# Patient Record
Sex: Male | Born: 1935 | Race: White | Hispanic: No | Marital: Married | Smoking: Former smoker
Health system: Southern US, Community
[De-identification: ages and names within clinical notes are randomized; demographics above are authoritative.]

## PROBLEM LIST (undated history)

## (undated) DIAGNOSIS — C44300 Unspecified malignant neoplasm of skin of unspecified part of face: Secondary | ICD-10-CM

## (undated) DIAGNOSIS — M1990 Unspecified osteoarthritis, unspecified site: Secondary | ICD-10-CM

## (undated) DIAGNOSIS — N289 Disorder of kidney and ureter, unspecified: Secondary | ICD-10-CM

## (undated) DIAGNOSIS — G629 Polyneuropathy, unspecified: Secondary | ICD-10-CM

## (undated) DIAGNOSIS — F32A Depression, unspecified: Secondary | ICD-10-CM

## (undated) DIAGNOSIS — K219 Gastro-esophageal reflux disease without esophagitis: Secondary | ICD-10-CM

## (undated) DIAGNOSIS — E7800 Pure hypercholesterolemia, unspecified: Secondary | ICD-10-CM

## (undated) DIAGNOSIS — F419 Anxiety disorder, unspecified: Secondary | ICD-10-CM

## (undated) DIAGNOSIS — I10 Essential (primary) hypertension: Secondary | ICD-10-CM

## (undated) DIAGNOSIS — F329 Major depressive disorder, single episode, unspecified: Secondary | ICD-10-CM

## (undated) DIAGNOSIS — C61 Malignant neoplasm of prostate: Secondary | ICD-10-CM

## (undated) DIAGNOSIS — F0390 Unspecified dementia without behavioral disturbance: Secondary | ICD-10-CM

## (undated) HISTORY — PX: TONSILLECTOMY: SUR1361

---

## 1984-01-11 HISTORY — PX: CARDIAC CATHETERIZATION: SHX172

## 1999-01-11 HISTORY — PX: CATARACT EXTRACTION, BILATERAL: SHX1313

## 2000-07-27 ENCOUNTER — Encounter: Admission: RE | Admit: 2000-07-27 | Discharge: 2000-10-25 | Payer: Self-pay | Admitting: Radiation Oncology

## 2000-12-09 ENCOUNTER — Encounter: Payer: Self-pay | Admitting: Urology

## 2000-12-09 ENCOUNTER — Ambulatory Visit: Admission: RE | Admit: 2000-12-09 | Discharge: 2001-03-09 | Payer: Self-pay | Admitting: Radiation Oncology

## 2001-04-11 HISTORY — PX: INSERTION PROSTATE RADIATION SEED: SUR718

## 2001-04-12 ENCOUNTER — Ambulatory Visit: Admission: RE | Admit: 2001-04-12 | Discharge: 2001-07-11 | Payer: Self-pay | Admitting: Radiation Oncology

## 2001-04-23 ENCOUNTER — Ambulatory Visit (HOSPITAL_BASED_OUTPATIENT_CLINIC_OR_DEPARTMENT_OTHER): Admission: RE | Admit: 2001-04-23 | Discharge: 2001-04-23 | Payer: Self-pay | Admitting: Urology

## 2003-07-07 ENCOUNTER — Encounter: Payer: Self-pay | Admitting: Pulmonary Disease

## 2004-09-19 ENCOUNTER — Ambulatory Visit: Payer: Self-pay | Admitting: Pulmonary Disease

## 2004-09-19 ENCOUNTER — Encounter: Payer: Self-pay | Admitting: Pulmonary Disease

## 2004-10-18 ENCOUNTER — Ambulatory Visit: Payer: Self-pay | Admitting: Pulmonary Disease

## 2005-01-08 ENCOUNTER — Ambulatory Visit: Payer: Self-pay | Admitting: Pulmonary Disease

## 2005-03-10 ENCOUNTER — Ambulatory Visit: Payer: Self-pay | Admitting: Pulmonary Disease

## 2005-03-31 ENCOUNTER — Ambulatory Visit: Payer: Self-pay | Admitting: Pulmonary Disease

## 2005-06-11 ENCOUNTER — Ambulatory Visit: Payer: Self-pay | Admitting: Pulmonary Disease

## 2005-09-10 ENCOUNTER — Ambulatory Visit: Payer: Self-pay | Admitting: Pulmonary Disease

## 2007-06-28 ENCOUNTER — Ambulatory Visit: Payer: Self-pay | Admitting: Internal Medicine

## 2007-06-28 DIAGNOSIS — J209 Acute bronchitis, unspecified: Secondary | ICD-10-CM

## 2007-06-28 DIAGNOSIS — K219 Gastro-esophageal reflux disease without esophagitis: Secondary | ICD-10-CM | POA: Insufficient documentation

## 2007-06-28 DIAGNOSIS — R05 Cough: Secondary | ICD-10-CM

## 2007-06-28 DIAGNOSIS — J329 Chronic sinusitis, unspecified: Secondary | ICD-10-CM | POA: Insufficient documentation

## 2007-06-28 DIAGNOSIS — R059 Cough, unspecified: Secondary | ICD-10-CM | POA: Insufficient documentation

## 2007-06-28 DIAGNOSIS — R0602 Shortness of breath: Secondary | ICD-10-CM

## 2008-02-28 ENCOUNTER — Ambulatory Visit: Payer: Self-pay | Admitting: Pulmonary Disease

## 2008-03-16 ENCOUNTER — Telehealth: Payer: Self-pay | Admitting: Pulmonary Disease

## 2008-03-21 ENCOUNTER — Ambulatory Visit: Payer: Self-pay | Admitting: Internal Medicine

## 2010-09-27 NOTE — Op Note (Signed)
[facility]  Patient:    Miguel Compton, YOUNGBERG Visit Number: [id number] MRN: [id number]          Service Type: NES Location: [facility] Attending Physician:  Trisha Mangle Dictated by:   Veverly Fells Vernie Ammons, M.D. Proc. Date: 04/23/01 Admit Date:  04/23/2001   CC:         Wynn Banker, M.D.  Renae Fickle, M.D.  Ophelia Charter, M.D.   Operative Report  PREOPERATIVE DIAGNOSIS:  Adenocarcinoma of the prostate.  POSTOPERATIVE DIAGNOSIS:  Adenocarcinoma of the prostate.  PROCEDURE:  I-125 radioactive seed implantation.  SURGEON:  Mark C. Vernie Ammons, M.D.  RADIATION ONCOLOGIST:  Wynn Banker, M.D.  BLOOD LOSS:  10 cc.  DRAINS:  33 French Foley catheter.  NUMBER OF SEEDS IMPLANTED:  97.  NUMBER OF NEEDLES USED:  25.  SPECIMENS:  None.  COMPLICATIONS:  None.  INDICATIONS:  The patient is a 77 year old white male with biopsy-proven adenocarcinoma of his prostate.  He had a Gleason score of 6.6 and biopsy revealed Gleason 6 (3+3) from the left lobe of the prostate.  The patient presented initially to me wanting to consider seed implantation; however, the size of his prostate appeared to preclude adequate coverage due to the bony pelvis interfering, and he was therefore placed on Lupron and an antiandrogen. After allowing the prostate volume time to decrease, the measurement of the prostate fell from 95 cc to 56 cc.  The patient is now brought to the operating room for radioactive seed implantation.  He understands the risks, complications, alternatives, and limitations.  DESCRIPTION OF OPERATION:  After informed consent, the patient brought to the major OR, placed on the table, and administered general anesthesia, then moved to a modified lithotomy position with the perineum perpendicular to the floor. A 16 French Foley catheter was inserted with dilute contrast filling the balloon, and the ultrasound probe with fixation device was placed  within the rectum and affixed to the table.  A rectal tube had been previously inserted within the rectum, and the ultrasound probe was positioned to that identical to planning.  Stabilizing needles were then inserted, and direct fluoroscopic and ultrasound visualization were used in order to implant the seeds using the grid.  No difficulty with bony pelvis interference was encountered.  After the seeds were placed, the transrectal ultrasound was removed, and flexible cystoscopy was performed.  I noted a single seed protruding from the anterior prostate into the bladder lumen upon retroflexion of the scope and therefore grasped this and removed the strand completely.  A second strand was noted to traverse the urethra just slightly.  My feeling was that this would likely serve as a significant source of irritation, and this was also grasped with alligator graspers and removed.  No further seeds, stones, or tumors were noted within the bladder which appeared to be otherwise normal.  The cystoscope was removed, and a Foley catheter was placed using 18 French Foley catheter.  The patient tolerated the procedure well.  There were no intraoperative complications.  He will be given a prescription for Vicodin ES #30, Flomax 0.4 mg 1 q.h.s. and Cipro 500 mg b.i.d. for 5 days.  He was given written instructions on medications and posttreatment side effects, etc.  His catheter will remain indwelling until Monday morning, at which time he will take the catheter out at home.  He will follow up with me in three weeks. Dictated by:   Veverly Fells Vernie Ammons, M.D. Attending Physician:  Vernie Ammons,  Dennison Mascot DD:  04/23/01 TD:  04/23/01 Job: [id number]

## 2014-03-31 ENCOUNTER — Encounter (HOSPITAL_COMMUNITY): Payer: Self-pay | Admitting: Family Medicine

## 2014-03-31 ENCOUNTER — Inpatient Hospital Stay (HOSPITAL_COMMUNITY)
Admission: EM | Admit: 2014-03-31 | Discharge: 2014-04-02 | DRG: 683 | Disposition: A | Discharge status: Home / Self Care | Payer: Medicare Other | Attending: Internal Medicine | Admitting: Internal Medicine

## 2014-03-31 DIAGNOSIS — E870 Hyperosmolality and hypernatremia: Secondary | ICD-10-CM | POA: Diagnosis present

## 2014-03-31 DIAGNOSIS — N289 Disorder of kidney and ureter, unspecified: Secondary | ICD-10-CM

## 2014-03-31 DIAGNOSIS — F329 Major depressive disorder, single episode, unspecified: Secondary | ICD-10-CM | POA: Diagnosis present

## 2014-03-31 DIAGNOSIS — Z7982 Long term (current) use of aspirin: Secondary | ICD-10-CM

## 2014-03-31 DIAGNOSIS — I10 Essential (primary) hypertension: Secondary | ICD-10-CM | POA: Diagnosis present

## 2014-03-31 DIAGNOSIS — Z79899 Other long term (current) drug therapy: Secondary | ICD-10-CM | POA: Diagnosis not present

## 2014-03-31 DIAGNOSIS — R3581 Nocturnal polyuria: Secondary | ICD-10-CM | POA: Diagnosis present

## 2014-03-31 DIAGNOSIS — J449 Chronic obstructive pulmonary disease, unspecified: Secondary | ICD-10-CM | POA: Diagnosis present

## 2014-03-31 DIAGNOSIS — N170 Acute kidney failure with tubular necrosis: Principal | ICD-10-CM | POA: Diagnosis present

## 2014-03-31 DIAGNOSIS — F419 Anxiety disorder, unspecified: Secondary | ICD-10-CM | POA: Diagnosis present

## 2014-03-31 DIAGNOSIS — D649 Anemia, unspecified: Secondary | ICD-10-CM | POA: Diagnosis present

## 2014-03-31 DIAGNOSIS — R109 Unspecified abdominal pain: Secondary | ICD-10-CM

## 2014-03-31 DIAGNOSIS — Z8673 Personal history of transient ischemic attack (TIA), and cerebral infarction without residual deficits: Secondary | ICD-10-CM

## 2014-03-31 DIAGNOSIS — F0390 Unspecified dementia without behavioral disturbance: Secondary | ICD-10-CM | POA: Diagnosis present

## 2014-03-31 DIAGNOSIS — R32 Unspecified urinary incontinence: Secondary | ICD-10-CM

## 2014-03-31 DIAGNOSIS — Z87891 Personal history of nicotine dependence: Secondary | ICD-10-CM | POA: Diagnosis not present

## 2014-03-31 DIAGNOSIS — E785 Hyperlipidemia, unspecified: Secondary | ICD-10-CM | POA: Diagnosis present

## 2014-03-31 DIAGNOSIS — N179 Acute kidney failure, unspecified: Secondary | ICD-10-CM

## 2014-03-31 DIAGNOSIS — R4189 Other symptoms and signs involving cognitive functions and awareness: Secondary | ICD-10-CM

## 2014-03-31 DIAGNOSIS — R351 Nocturia: Secondary | ICD-10-CM

## 2014-03-31 DIAGNOSIS — K219 Gastro-esophageal reflux disease without esophagitis: Secondary | ICD-10-CM

## 2014-03-31 HISTORY — DX: Major depressive disorder, single episode, unspecified: F32.9

## 2014-03-31 HISTORY — DX: Pure hypercholesterolemia, unspecified: E78.00

## 2014-03-31 HISTORY — DX: Depression, unspecified: F32.A

## 2014-03-31 HISTORY — DX: Unspecified dementia, unspecified severity, without behavioral disturbance, psychotic disturbance, mood disturbance, and anxiety: F03.90

## 2014-03-31 HISTORY — DX: Anxiety disorder, unspecified: F41.9

## 2014-03-31 LAB — BASIC METABOLIC PANEL
Anion gap: 14 (ref 5–15)
BUN: 29 mg/dL — ABNORMAL HIGH (ref 6–23)
CO2: 23 mEq/L (ref 19–32)
Calcium: 8.9 mg/dL (ref 8.4–10.5)
Chloride: 100 mEq/L (ref 96–112)
Creatinine, Ser: 2.98 mg/dL — ABNORMAL HIGH (ref 0.50–1.35)
GFR calc Af Amer: 20 mL/min — ABNORMAL LOW (ref >90)
GFR calc non Af Amer: 17 mL/min — ABNORMAL LOW (ref >90)
Glucose, Bld: 96 mg/dL (ref 70–99)
Potassium: 4.7 mEq/L (ref 3.7–5.3)
Sodium: 137 mEq/L (ref 137–147)

## 2014-03-31 LAB — URINALYSIS, ROUTINE W REFLEX MICROSCOPIC
Bilirubin Urine: NEGATIVE
Glucose, UA: NEGATIVE mg/dL
Hgb urine dipstick: NEGATIVE
Ketones, ur: NEGATIVE mg/dL
Leukocytes, UA: NEGATIVE
Nitrite: NEGATIVE
Protein, ur: NEGATIVE mg/dL
Specific Gravity, Urine: 1.006 (ref 1.005–1.030)
Urobilinogen, UA: 0.2 mg/dL (ref 0.0–1.0)
pH: 7 (ref 5.0–8.0)

## 2014-03-31 LAB — URINALYSIS W MICROSCOPIC (NOT AT ARMC)
BILIRUBIN URINE: NEGATIVE
Glucose, UA: NEGATIVE mg/dL
HGB URINE DIPSTICK: NEGATIVE
Ketones, ur: NEGATIVE mg/dL
Leukocytes, UA: NEGATIVE
NITRITE: NEGATIVE
PROTEIN: NEGATIVE mg/dL
SPECIFIC GRAVITY, URINE: 1.007 (ref 1.005–1.030)
UROBILINOGEN UA: 0.2 mg/dL (ref 0.0–1.0)
pH: 7 (ref 5.0–8.0)

## 2014-03-31 LAB — CBC
HCT: 32 % — ABNORMAL LOW (ref 39.0–52.0)
Hemoglobin: 10.6 g/dL — ABNORMAL LOW (ref 13.0–17.0)
MCH: 30.2 pg (ref 26.0–34.0)
MCHC: 33.1 g/dL (ref 30.0–36.0)
MCV: 91.2 fL (ref 78.0–100.0)
Platelets: 262 10*3/uL (ref 150–400)
RBC: 3.51 MIL/uL — ABNORMAL LOW (ref 4.22–5.81)
RDW: 13.3 % (ref 11.5–15.5)
WBC: 8.1 10*3/uL (ref 4.0–10.5)

## 2014-03-31 MED ORDER — PANTOPRAZOLE SODIUM 40 MG PO TBEC
40.0000 mg | DELAYED_RELEASE_TABLET | Freq: Every day | ORAL | Status: DC
  Administered 2014-04-01 – 2014-04-02 (×2): 40 mg via ORAL
  Filled 2014-03-31 (×2): qty 1

## 2014-03-31 MED ORDER — ASPIRIN EC 81 MG PO TBEC
81.0000 mg | DELAYED_RELEASE_TABLET | Freq: Every day | ORAL | Status: DC
  Administered 2014-04-01 – 2014-04-02 (×2): 81 mg via ORAL
  Filled 2014-03-31 (×2): qty 1

## 2014-03-31 MED ORDER — ACETAMINOPHEN 650 MG RE SUPP: 650.0000 mg | Freq: Four times a day (QID) | RECTAL | Status: DC | PRN

## 2014-03-31 MED ORDER — PRAVASTATIN SODIUM 80 MG PO TABS
80.0000 mg | ORAL_TABLET | Freq: Every day | ORAL | Status: DC
  Administered 2014-04-01 – 2014-04-02 (×2): 80 mg via ORAL
  Filled 2014-03-31 (×2): qty 1

## 2014-03-31 MED ORDER — ACETAMINOPHEN 325 MG PO TABS: 650.0000 mg | ORAL_TABLET | Freq: Four times a day (QID) | ORAL | Status: DC | PRN

## 2014-03-31 MED ORDER — ALBUTEROL SULFATE HFA 108 (90 BASE) MCG/ACT IN AERS: 1.0000 | INHALATION_SPRAY | RESPIRATORY_TRACT | Status: DC | PRN

## 2014-03-31 MED ORDER — DONEPEZIL HCL 5 MG PO TABS
5.0000 mg | ORAL_TABLET | Freq: Every day | ORAL | Status: DC
  Administered 2014-04-01 – 2014-04-02 (×2): 5 mg via ORAL
  Filled 2014-03-31 (×2): qty 1

## 2014-03-31 MED ORDER — HEPARIN SODIUM (PORCINE) 5000 UNIT/ML IJ SOLN
5000.0000 [IU] | Freq: Three times a day (TID) | INTRAMUSCULAR | Status: DC
  Administered 2014-03-31 – 2014-04-02 (×5): 5000 [IU] via SUBCUTANEOUS
  Filled 2014-03-31 (×6): qty 1

## 2014-03-31 MED ORDER — ALBUTEROL SULFATE (2.5 MG/3ML) 0.083% IN NEBU: 3.0000 mL | INHALATION_SOLUTION | RESPIRATORY_TRACT | Status: DC | PRN

## 2014-03-31 MED ORDER — ONDANSETRON HCL 4 MG PO TABS: 4.0000 mg | ORAL_TABLET | Freq: Four times a day (QID) | ORAL | Status: DC | PRN

## 2014-03-31 MED ORDER — SODIUM CHLORIDE 0.9 % IV SOLN
INTRAVENOUS | Status: AC
  Administered 2014-04-01: via INTRAVENOUS

## 2014-03-31 MED ORDER — FLUTICASONE PROPIONATE HFA 44 MCG/ACT IN AERO
1.0000 | INHALATION_SPRAY | Freq: Two times a day (BID) | RESPIRATORY_TRACT | Status: DC
  Filled 2014-03-31: qty 10.6

## 2014-03-31 MED ORDER — MIRTAZAPINE 15 MG PO TABS
15.0000 mg | ORAL_TABLET | Freq: Every day | ORAL | Status: DC
  Administered 2014-03-31 – 2014-04-01 (×2): 15 mg via ORAL
  Filled 2014-03-31 (×3): qty 1

## 2014-03-31 MED ORDER — ONDANSETRON HCL 4 MG/2ML IJ SOLN: 4.0000 mg | Freq: Four times a day (QID) | INTRAMUSCULAR | Status: DC | PRN

## 2014-03-31 MED ORDER — ALPRAZOLAM 0.25 MG PO TABS: 0.2500 mg | ORAL_TABLET | Freq: Every day | ORAL | Status: DC | PRN

## 2014-03-31 MED ORDER — DARIFENACIN HYDROBROMIDE ER 7.5 MG PO TB24
7.5000 mg | ORAL_TABLET | Freq: Every day | ORAL | Status: DC
  Administered 2014-04-01 – 2014-04-02 (×2): 7.5 mg via ORAL
  Filled 2014-03-31 (×2): qty 1

## 2014-03-31 NOTE — ED Notes (Signed)
Dr patel paged to notify pt is in ED

## 2014-03-31 NOTE — ED Notes (Signed)
Transporting patient to new room assignment. 

## 2014-03-31 NOTE — ED Notes (Signed)
Per pt sent here by his doctor for elevated creatinine and decreased GFR. sts some lower right back pain.

## 2014-03-31 NOTE — ED Provider Notes (Addendum)
CSN: [id number]     Arrival date & time 03/31/14  1706 History   First MD Initiated Contact with Patient 03/31/14 1800     Chief Complaint  Patient presents with  . Acute Renal Failure      HPI  Patient presents for evaluation of acute renal insufficiency. He is 80 years old has no prior history of kidney disease. Has no prior history of renal insufficiency. He is not hypertensive or diabetic. He has been seen with his physician Dr. Micheal Likens or renal intensive insufficiency starting in August. Over the last 3 days he's had deterioration of his GFR/Cr. Creatinine is gone from 2.1-2.9. He has polyuria. He reports some nondescript right lower back pain.  I discussed the case upon the patient's arrival with Dr. Posey Pronto of nephrology. Dr. Micheal Likens had spoken with Dr. Posey Pronto who recommended the patient come to [facility] for dementia and further evaluation after an outpatient renal ultrasound was normal.  Not retaining urine. Not on any new or offending medications that would cause renal insufficiency.  Past Medical History  Diagnosis Date  . COPD (chronic obstructive pulmonary disease)   . High cholesterol   . Dementia   . Depression   . Anxiety   . Stroke   . Reflux esophagitis    History reviewed. No pertinent past surgical history. History reviewed. No pertinent family history. History  Substance Use Topics  . Smoking status: Former Research scientist (life sciences)  . Smokeless tobacco: Not on file  . Alcohol Use: No    Review of Systems  Constitutional: Negative for fever, chills, diaphoresis, appetite change and fatigue.  HENT: Negative for mouth sores, sore throat and trouble swallowing.   Eyes: Negative for visual disturbance.  Respiratory: Negative for cough, chest tightness, shortness of breath and wheezing.   Cardiovascular: Negative for chest pain.  Gastrointestinal: Negative for nausea, vomiting, abdominal pain, diarrhea and abdominal distention.  Endocrine: Negative for polydipsia, polyphagia and polyuria.   Genitourinary: Positive for urgency and frequency. Negative for dysuria, hematuria and difficulty urinating.       Nocturia  Musculoskeletal: Negative for gait problem.  Skin: Negative for color change, pallor and rash.  Neurological: Negative for dizziness, syncope, light-headedness and headaches.  Hematological: Does not bruise/bleed easily.  Psychiatric/Behavioral: Negative for behavioral problems and confusion.      Allergies  Review of patient's allergies indicates no known allergies.  Home Medications   Prior to Admission medications   Medication Sig Start Date End Date Taking? Authorizing Provider  donepezil (ARICEPT) 5 MG tablet Take 5 mg by mouth daily. 03/11/14  Yes Historical Provider, MD  omeprazole (PRILOSEC) 20 MG capsule Take 20 mg by mouth daily. 03/11/14  Yes Historical Provider, MD  pravastatin (PRAVACHOL) 80 MG tablet Take 80 mg by mouth daily. 03/11/14  Yes Historical Provider, MD  VESICARE 5 MG tablet Take 5 mg by mouth daily. 01/10/14  Yes Historical Provider, MD   BP 165/59 mmHg  Pulse 81  Temp(Src) 98.3 F (36.8 C)  Resp 18  SpO2 100% Physical Exam  Constitutional: He is oriented to person, place, and time. He appears well-developed and well-nourished. No distress.  HENT:  Head: Normocephalic.  Eyes: Conjunctivae are normal. Pupils are equal, round, and reactive to light. No scleral icterus.  Neck: Normal range of motion. Neck supple. No thyromegaly present.  Cardiovascular: Normal rate and regular rhythm.  Exam reveals no gallop and no friction rub.   No murmur heard. Pulmonary/Chest: Effort normal and breath sounds normal. No respiratory distress. He  has no wheezes. He has no rales.  Abdominal: Soft. Bowel sounds are normal. He exhibits no distension. There is no tenderness. There is no rebound.  Nontender over the suprapubic abdomen. Nontender to percuss over the flank. No rash or lesions or vesicles across the flank or abdomen.  Musculoskeletal:  Normal range of motion.  Neurological: He is alert and oriented to person, place, and time.  Skin: Skin is warm and dry. No rash noted.  Psychiatric: He has a normal mood and affect. His behavior is normal.    ED Course  Procedures (including critical care time) Labs Review Labs Reviewed  CBC - Abnormal; Notable for the following:    RBC 3.51 (*)    Hemoglobin 10.6 (*)    HCT 32.0 (*)    All other components within normal limits  BASIC METABOLIC PANEL - Abnormal; Notable for the following:    BUN 29 (*)    Creatinine, Ser 2.98 (*)    GFR calc non Af Amer 17 (*)    GFR calc Af Amer 20 (*)    All other components within normal limits  URINALYSIS, ROUTINE W REFLEX MICROSCOPIC    Imaging Review No results found.   EKG Interpretation None      MDM   Final diagnoses:  Renal insufficiency    Formal ultrasound report from [facility] radiology states "mild bilateral renal cortical thickening without hydronephrosis or renal mass". Date 03/31/2014  Cr 2.94, BUN 26. Ultrasound at bedside shows no urinary retention or hydronephrosis. Will place a call to hospitalist regarding admission. I have already consulted Dr. Yevette Edwards, MD 03/31/14 2050  Tanna Furry, MD 03/31/14 2100

## 2014-03-31 NOTE — H&P (Signed)
[facility] History and Physical  Patient: Miguel Compton  WPY:[id number]  DOB: 1921-07-13  DOS: the patient was seen and examined on 03/31/2014 PCP: Pcp Not In System  Chief Complaint: Abnormal lab  HPI: Miguel Compton is a 80 y.o. male with Past medical history of COPD, dementia, depression, CVA, overflow incontinence. The patient is presenting with complaints of an abnormal lab. He mentions that since last 2 months he has been having generalized malaise, leg swelling, and poor appetite with weight loss as throughout the year he has been busy taking care of his wife who has been hospitalized multiple times. He has noticed he has some right-sided flank pain ongoing since last one week. He also mentions that he has occasional episodes of constipation followed by episodes of bowel movements which are consistent only mucousy output for last a year or so. He denies any rash or taking any Aleve or ibuprofen. He denies any exposure to severe chemicals. 2 months ago on his regular follow-up with his PCP he was found to have increasing serum creatinine and was recommended for a follow-up blood work. At the time of his follow-up blood work he also complained of the flank pain and therefore he was recommended to obtain a renal ultrasound. The Renal ultrasound showed bilateral Kidney right 12.1, and left 13.4 with bilateral Diffuse cortical thinning, prevoid urine volume 25.5 post void 3.8 And No hydranephrosis His serum creatinine was 1.16 8/13, 2.13 11/16 and, 2.94 11/19, BUN ranging between 8-10 and no other abnormality He denies any hematuria. He did have some overflow incontinence for which she was placed on the medication the name of which he does not know after which she started having difficulty urination and that his medication was changed and at this point he does not have any issues with urination.  The patient is coming from home. And at his baseline independent for most of his  ADL.  Review of Systems: as mentioned in the history of present illness.  A Comprehensive review of the other systems is negative.  Past Medical History  Diagnosis Date  . COPD (chronic obstructive pulmonary disease)   . High cholesterol   . Dementia   . Depression   . Anxiety   . Stroke   . Reflux esophagitis    History reviewed. No pertinent past surgical history. Social History:  reports that he has quit smoking. He does not have any smokeless tobacco history on file. He reports that he does not drink alcohol. His drug history is not on file.  No Known Allergies  History reviewed. No pertinent family history.  Prior to Admission medications   Medication Sig Start Date End Date Taking? Authorizing Provider  albuterol (PROVENTIL HFA;VENTOLIN HFA) 108 (90 BASE) MCG/ACT inhaler Inhale 1-2 puffs into the lungs every 4 (four) hours as needed for wheezing or shortness of breath.   Yes Historical Provider, MD  ALPRAZolam Duanne Moron) 0.25 MG tablet Take 0.25 mg by mouth daily as needed for anxiety.   Yes Historical Provider, MD  aspirin EC 81 MG tablet Take 81 mg by mouth daily.   Yes Historical Provider, MD  Cyanocobalamin (B-12 PO) Take by mouth.   Yes Historical Provider, MD  donepezil (ARICEPT) 5 MG tablet Take 5 mg by mouth daily. 03/11/14  Yes Historical Provider, MD  fluticasone (FLOVENT DISKUS) 50 MCG/BLIST diskus inhaler Inhale 1 puff into the lungs 2 (two) times daily.   Yes Historical Provider, MD  mirtazapine (REMERON) 15 MG tablet Take 15  mg by mouth at bedtime.   Yes Historical Provider, MD  Omega-3 Fatty Acids (FISH OIL) 1000 MG CAPS Take by mouth.   Yes Historical Provider, MD  omeprazole (PRILOSEC) 20 MG capsule Take 20 mg by mouth daily. 03/11/14  Yes Historical Provider, MD  pravastatin (PRAVACHOL) 80 MG tablet Take 80 mg by mouth daily. 03/11/14  Yes Historical Provider, MD  VESICARE 5 MG tablet Take 5 mg by mouth daily. 01/10/14  Yes Historical Provider, MD    Physical  Exam: Filed Vitals:   03/31/14 2000 03/31/14 2015 03/31/14 2045 03/31/14 2100  BP: 166/81 156/68 180/58 153/63  Pulse: 83  48   Temp:      Resp:  13  13  SpO2: 92% 100% 100%     General: Alert, Awake and Oriented to Time, Place and Person. Appear in  distress Eyes: PERRL ENT: Oral Mucosa clear moist. Neck: no JVD Cardiovascular: S1 and S2 Present, no Murmur, Peripheral Pulses Present Respiratory: Bilateral Air entry equal and Decreased, Clear to Auscultation, noCrackles, no wheezes Abdomen: Bowel Sound present, Soft and non tender Skin: no Rash Extremities: no Pedal edema, no calf tenderness Neurologic: Grossly no focal neuro deficit.  Labs on Admission:  CBC:  Recent Labs Lab 03/31/14 1835  WBC 8.1  HGB 10.6*  HCT 32.0*  MCV 91.2  PLT 262    CMP     Component Value Date/Time   NA 137 03/31/2014 1835   K 4.7 03/31/2014 1835   CL 100 03/31/2014 1835   CO2 23 03/31/2014 1835   GLUCOSE 96 03/31/2014 1835   BUN 29* 03/31/2014 1835   CREATININE 2.98* 03/31/2014 1835   CALCIUM 8.9 03/31/2014 1835   GFRNONAA 17* 03/31/2014 1835   GFRAA 20* 03/31/2014 1835    No results for input(s): LIPASE, AMYLASE in the last 168 hours. No results for input(s): AMMONIA in the last 168 hours.  No results for input(s): CKTOTAL, CKMB, CKMBINDEX, TROPONINI in the last 168 hours. BNP (last 3 results) No results for input(s): PROBNP in the last 8760 hours.  Radiological Exams on Admission: No results found.   Assessment/Plan Active Problems:   AKI (acute kidney injury)   Right flank pain   Essential hypertension   Urinary incontinence   Cognitive changes   GERD (gastroesophageal reflux disease)   Nocturnal polyuria   1. AKI (acute kidney injury) The patient is presenting with progressively worsening serum creatinine function. He also had some renal right-sided flank pain. With this his PCP consulted nephrology as an outpatient was recommended the patient to be admitted in  [place] [facility] for further workup. At present the patient will be gently hydrated with fluid challenge. The etiology of his acute kidney injury is unclear but cortical thinning in the presence medical renal disease. We'll continue monitoring serum be met and avoid nephrotoxic medication and follow nephrology's recommendations.  2. Urinary incontinence and difficulty urination. Continue vesicare.  3. Dementia without any behavioral changes. Continue Aricept and Remeron.  4. Dyslipidemia. Continue pravastatin  Advance goals of care discussion: Full code   Consults: Nephrology Dr. Graylon Gunning  DVT Prophylaxis: subcutaneous Heparin Nutrition: Renal diet  Disposition: Admitted to inpatient in telemetry unit.  Author: Berle Mull, MD [facility] Pager: [phone] 03/31/2014, 10:05 PM    If 7PM-7AM, please contact night-coverage [email or web address] Password [email or web address]

## 2014-04-01 LAB — COMPREHENSIVE METABOLIC PANEL
ALT: 10 U/L (ref 0–53)
ANION GAP: 12 (ref 5–15)
AST: 19 U/L (ref 0–37)
Albumin: 2.4 g/dL — ABNORMAL LOW (ref 3.5–5.2)
Alkaline Phosphatase: 53 U/L (ref 39–117)
BUN: 31 mg/dL — AB (ref 6–23)
CO2: 27 meq/L (ref 19–32)
CREATININE: 3.06 mg/dL — AB (ref 0.50–1.35)
Calcium: 8.8 mg/dL (ref 8.4–10.5)
Chloride: 105 mEq/L (ref 96–112)
GFR, EST AFRICAN AMERICAN: 19 mL/min — AB (ref >90)
GFR, EST NON AFRICAN AMERICAN: 17 mL/min — AB (ref >90)
Glucose, Bld: 81 mg/dL (ref 70–99)
Potassium: 4.3 mEq/L (ref 3.7–5.3)
Sodium: 144 mEq/L (ref 137–147)
Total Bilirubin: 0.3 mg/dL (ref 0.3–1.2)
Total Protein: 6.8 g/dL (ref 6.0–8.3)

## 2014-04-01 LAB — CBC
HCT: 28.7 % — ABNORMAL LOW (ref 39.0–52.0)
Hemoglobin: 9.7 g/dL — ABNORMAL LOW (ref 13.0–17.0)
MCH: 31.4 pg (ref 26.0–34.0)
MCHC: 33.8 g/dL (ref 30.0–36.0)
MCV: 92.9 fL (ref 78.0–100.0)
PLATELETS: 260 10*3/uL (ref 150–400)
RBC: 3.09 MIL/uL — ABNORMAL LOW (ref 4.22–5.81)
RDW: 13.5 % (ref 11.5–15.5)
WBC: 7.5 10*3/uL (ref 4.0–10.5)

## 2014-04-01 LAB — FERRITIN: Ferritin: 346 ng/mL — ABNORMAL HIGH (ref 22–322)

## 2014-04-01 LAB — IRON AND TIBC
Iron: 105 ug/dL (ref 42–135)
SATURATION RATIOS: 47 % (ref 20–55)
TIBC: 222 ug/dL (ref 215–435)
UIBC: 117 ug/dL — ABNORMAL LOW (ref 125–400)

## 2014-04-01 LAB — PROTEIN / CREATININE RATIO, URINE
Creatinine, Urine: 46.03 mg/dL
PROTEIN CREATININE RATIO: 2.5 — AB (ref 0.00–0.15)
Total Protein, Urine: 114.9 mg/dL

## 2014-04-01 LAB — SODIUM, URINE, RANDOM: SODIUM UR: 62 meq/L

## 2014-04-01 LAB — PROTIME-INR
INR: 1.11 (ref 0.00–1.49)
PROTHROMBIN TIME: 14.4 s (ref 11.6–15.2)

## 2014-04-01 LAB — CREATININE, URINE, RANDOM: CREATININE, URINE: 45.41 mg/dL

## 2014-04-01 NOTE — Progress Notes (Signed)
[facility] PROGRESS NOTE  Miguel Compton HUD:[id number] DOB: [birth year] DOA: 03/31/2014 PCP: Pcp Not In System  Assessment/Plan: 1. AKI (acute kidney injury) -likely ATN, etiology unclear, Renal was consulted, will follow recs -continue IVF -Renal US without hydronephrosis, evidence of medico-renal disease -bmet in am -baseline was 1.16 on 8/15  2. Urinary incontinence and difficulty urination. -Continue vesicare.  3. Dementia without any behavioral changes. Continue Aricept and Remeron.  4. Dyslipidemia. Continue pravastatin  DVT proph: Hep SQ  Code Status: Full Code Family Communication: none at bedside Disposition Plan: home when creatinine stable   Consultants:  Renal  HPI/Subjective: Bed uncomfortable, wants to go home  Objective: Filed Vitals:   04/01/14 1020  BP: 156/59  Pulse: 62  Temp: 97.9 F (36.6 C)  Resp:     Intake/Output Summary (Last 24 hours) at 04/01/14 1118 Last data filed at 04/01/14 0700  Gross per 24 hour  Intake    350 ml  Output    800 ml  Net   -450 ml   Filed Weights   03/31/14 2231  Weight: 84.823 kg (187 lb)    Exam:   General:  AAOx3  Cardiovascular: S1S2/RRR  Respiratory: CTAB  Abdomen: soft, NT, BS present  Musculoskeletal: no edema c/c   Data Reviewed: Basic Metabolic Panel:  Recent Labs Lab 03/31/14 1835 04/01/14 0525  NA 137 144  K 4.7 4.3  CL 100 105  CO2 23 27  GLUCOSE 96 81  BUN 29* 31*  CREATININE 2.98* 3.06*  CALCIUM 8.9 8.8   Liver Function Tests:  Recent Labs Lab 04/01/14 0525  AST 19  ALT 10  ALKPHOS 53  BILITOT 0.3  PROT 6.8  ALBUMIN 2.4*   No results for input(s): LIPASE, AMYLASE in the last 168 hours. No results for input(s): AMMONIA in the last 168 hours. CBC:  Recent Labs Lab 03/31/14 1835 04/01/14 0525  WBC 8.1 7.5  HGB 10.6* 9.7*  HCT 32.0* 28.7*  MCV 91.2 92.9  PLT 262 260   Cardiac Enzymes: No results for input(s): CKTOTAL, CKMB, CKMBINDEX,  TROPONINI in the last 168 hours. BNP (last 3 results) No results for input(s): PROBNP in the last 8760 hours. CBG: No results for input(s): GLUCAP in the last 168 hours.  No results found for this or any previous visit (from the past 240 hour(s)).   Studies: No results found.  Scheduled Meds: . aspirin EC  81 mg Oral Daily  . darifenacin  7.5 mg Oral Daily  . donepezil  5 mg Oral Daily  . fluticasone  1 puff Inhalation BID  . heparin  5,000 Units Subcutaneous 3 times per day  . mirtazapine  15 mg Oral QHS  . pantoprazole  40 mg Oral Daily  . pravastatin  80 mg Oral Daily   Continuous Infusions: . sodium chloride 100 mL/hr at 04/01/14 0813   Antibiotics Given (last 72 hours)    None      Principal Problem:   AKI (acute kidney injury) Active Problems:   Right flank pain   Essential hypertension   Urinary incontinence   Cognitive changes   GERD (gastroesophageal reflux disease)   Nocturnal polyuria    Time spent: 102min    [facility] Pager [phone]. If 7PM-7AM, please contact night-coverage at [email or web address], password [facility] 04/01/2014, 11:18 AM  LOS: 1 day

## 2014-04-01 NOTE — Plan of Care (Signed)
Problem: Consults Goal: ARF/New Onset CRF Patient Education See Patient Education Module for education specifics. Outcome: Adequate for Discharge Goal: Skin Care Protocol Initiated - if Braden Score 18 or less If consults are not indicated, leave blank or document N/A Outcome: Not Applicable Date Met:  73/08/56 Goal: Nutrition Consult-if indicated Outcome: Not Applicable Date Met:  94/37/00 Goal: Diabetes Guidelines if Diabetic/Glucose > 140 If diabetic or lab glucose is > 140 mg/dl - Initiate Diabetes/Hyperglycemia Guidelines & Document Interventions  Outcome: Not Applicable Date Met:  52/59/10

## 2014-04-01 NOTE — Consult Note (Signed)
Reason for Consult: Acute renal failure Referring Physician: Domenic Polite M.D. [facility])  HPI:  80 year old Caucasian man with past medical history significant for CVA without significant residual neurological deficits, COPD, GERD, history of prostate problems in the past with overflow incontinence. He also has a history of dementia with depression/anxiety. He was referred to the emergency room for further evaluation and management of his acute renal failure-creatinine noted to be rising from 1.16 on 12/22/13 to 2.13 on 03/27/14 and 2.94 on 03/30/14. Renal ultrasound done on 03/31/14 shows mild bilateral renal cortical thinning without any hydronephrosis or renal mass. Right kidney 12.1 cm, left kidney 13.4 cm.  The patient denies any recurrent problems with nausea, vomiting or diarrhea although reports episodes of stool urgency/passing mucus and a lot of flatus for which he has been seen by gastroenterology and placed on a fiber supplement. He denies any dysuria, hematuria or foamy urine. He has chronic urinary frequency/nocturia that is unchanged over the past few weeks. Denies any skin rash and reports a chronic cough without any hemoptysis or sinusitis. He had a fall about a month ago when he injured his right supraorbital ridge and required stitches to be placed-a few days later, he developed some right-sided flank pain but without any other urinary symptoms. He denies the use of over-the-counter nonsteroidal anti-inflammatory drugs. He denies prior history of renal failure. Denies history of recurrent nephrolithiasis or urinary tract infection.  His only complaint today is having had a very uncomfortable night in the hospital bed.  Past Medical History  Diagnosis Date  . COPD (chronic obstructive pulmonary disease)   . High cholesterol   . Dementia   . Depression   . Anxiety   . Stroke   . Reflux esophagitis     History reviewed. No pertinent past surgical history.  History reviewed. No  pertinent family history.  Social History:  reports that he has quit smoking. He does not have any smokeless tobacco history on file. He reports that he does not drink alcohol. His drug history is not on file.  Allergies: No Known Allergies  Medications:  Scheduled: . aspirin EC  81 mg Oral Daily  . darifenacin  7.5 mg Oral Daily  . donepezil  5 mg Oral Daily  . fluticasone  1 puff Inhalation BID  . heparin  5,000 Units Subcutaneous 3 times per day  . mirtazapine  15 mg Oral QHS  . pantoprazole  40 mg Oral Daily  . pravastatin  80 mg Oral Daily    Results for orders placed or performed during the hospital encounter of 03/31/14 (from the past 48 hour(s))  Urinalysis, Routine w reflex microscopic     Status: None   Collection Time: 03/31/14  5:34 PM  Result Value Ref Range   Color, Urine YELLOW YELLOW   APPearance CLEAR CLEAR   Specific Gravity, Urine 1.006 1.005 - 1.030   pH 7.0 5.0 - 8.0   Glucose, UA NEGATIVE NEGATIVE mg/dL   Hgb urine dipstick NEGATIVE NEGATIVE   Bilirubin Urine NEGATIVE NEGATIVE   Ketones, ur NEGATIVE NEGATIVE mg/dL   Protein, ur NEGATIVE NEGATIVE mg/dL   Urobilinogen, UA 0.2 0.0 - 1.0 mg/dL   Nitrite NEGATIVE NEGATIVE   Leukocytes, UA NEGATIVE NEGATIVE    Comment: MICROSCOPIC NOT DONE ON URINES WITH NEGATIVE PROTEIN, BLOOD, LEUKOCYTES, NITRITE, OR GLUCOSE <1000 mg/dL.  CBC     Status: Abnormal   Collection Time: 03/31/14  6:35 PM  Result Value Ref Range   WBC 8.1 4.0 -  10.5 K/uL   RBC 3.51 (L) 4.22 - 5.81 MIL/uL   Hemoglobin 10.6 (L) 13.0 - 17.0 g/dL   HCT 32.0 (L) 39.0 - 52.0 %   MCV 91.2 78.0 - 100.0 fL   MCH 30.2 26.0 - 34.0 pg   MCHC 33.1 30.0 - 36.0 g/dL   RDW 13.3 11.5 - 15.5 %   Platelets 262 150 - 400 K/uL  Basic metabolic panel     Status: Abnormal   Collection Time: 03/31/14  6:35 PM  Result Value Ref Range   Sodium 137 137 - 147 mEq/L   Potassium 4.7 3.7 - 5.3 mEq/L   Chloride 100 96 - 112 mEq/L   CO2 23 19 - 32 mEq/L   Glucose,  Bld 96 70 - 99 mg/dL   BUN 29 (H) 6 - 23 mg/dL   Creatinine, Ser 2.98 (H) 0.50 - 1.35 mg/dL   Calcium 8.9 8.4 - 10.5 mg/dL   GFR calc non Af Amer 17 (L) >90 mL/min   GFR calc Af Amer 20 (L) >90 mL/min    Comment: (NOTE) The eGFR has been calculated using the CKD EPI equation. This calculation has not been validated in all clinical situations. eGFR's persistently <90 mL/min signify possible Chronic Kidney Disease.    Anion gap 14 5 - 15  Urinalysis with microscopic     Status: Abnormal   Collection Time: 03/31/14  9:06 PM  Result Value Ref Range   Color, Urine YELLOW YELLOW   APPearance CLEAR CLEAR   Specific Gravity, Urine 1.007 1.005 - 1.030   pH 7.0 5.0 - 8.0   Glucose, UA NEGATIVE NEGATIVE mg/dL   Hgb urine dipstick NEGATIVE NEGATIVE   Bilirubin Urine NEGATIVE NEGATIVE   Ketones, ur NEGATIVE NEGATIVE mg/dL   Protein, ur NEGATIVE NEGATIVE mg/dL   Urobilinogen, UA 0.2 0.0 - 1.0 mg/dL   Nitrite NEGATIVE NEGATIVE   Leukocytes, UA NEGATIVE NEGATIVE   WBC, UA 0-2 <3 WBC/hpf   Casts GRANULAR CAST (A) NEGATIVE    Comment: CALLED DR.JAMES 2213 03/31/14 WB[facility]  Comprehensive metabolic panel     Status: Abnormal   Collection Time: 04/01/14  5:25 AM  Result Value Ref Range   Sodium 144 137 - 147 mEq/L   Potassium 4.3 3.7 - 5.3 mEq/L   Chloride 105 96 - 112 mEq/L   CO2 27 19 - 32 mEq/L   Glucose, Bld 81 70 - 99 mg/dL   BUN 31 (H) 6 - 23 mg/dL   Creatinine, Ser 3.06 (H) 0.50 - 1.35 mg/dL   Calcium 8.8 8.4 - 10.5 mg/dL   Total Protein 6.8 6.0 - 8.3 g/dL   Albumin 2.4 (L) 3.5 - 5.2 g/dL   AST 19 0 - 37 U/L   ALT 10 0 - 53 U/L   Alkaline Phosphatase 53 39 - 117 U/L   Total Bilirubin 0.3 0.3 - 1.2 mg/dL   GFR calc non Af Amer 17 (L) >90 mL/min   GFR calc Af Amer 19 (L) >90 mL/min    Comment: (NOTE) The eGFR has been calculated using the CKD EPI equation. This calculation has not been validated in all clinical situations. eGFR's persistently <90 mL/min signify possible Chronic  Kidney Disease.    Anion gap 12 5 - 15  CBC     Status: Abnormal   Collection Time: 04/01/14  5:25 AM  Result Value Ref Range   WBC 7.5 4.0 - 10.5 K/uL   RBC 3.09 (L) 4.22 - 5.81 MIL/uL  Hemoglobin 9.7 (L) 13.0 - 17.0 g/dL   HCT 28.7 (L) 39.0 - 52.0 %   MCV 92.9 78.0 - 100.0 fL   MCH 31.4 26.0 - 34.0 pg   MCHC 33.8 30.0 - 36.0 g/dL   RDW 13.5 11.5 - 15.5 %   Platelets 260 150 - 400 K/uL  Protime-INR     Status: None   Collection Time: 04/01/14  5:25 AM  Result Value Ref Range   Prothrombin Time 14.4 11.6 - 15.2 seconds   INR 1.11 0.00 - 1.49    No results found.  Review of Systems  Constitutional: Negative.   HENT: Negative.   Eyes: Negative.   Respiratory: Positive for cough and sputum production. Negative for hemoptysis, shortness of breath and wheezing.        Chronic cough with clear white sputum  Cardiovascular: Negative.   Gastrointestinal:       See history of presenting illness above-reports stool urgency/fecal incontinence and passing large amounts of flatus/mucus  Genitourinary: Positive for flank pain. Negative for dysuria and hematuria.       Reports urinary frequency/nocturia that are unchanged.  Musculoskeletal: Negative.   Skin: Negative.   Neurological: Negative.   Endo/Heme/Allergies: Negative.   Psychiatric/Behavioral: Negative.   All other systems reviewed and are negative.  Blood pressure 156/64, pulse 63, temperature 98.7 F (37.1 C), temperature source Oral, resp. rate 16, weight 84.823 kg (187 lb), SpO2 96 %. Physical Exam  Nursing note and vitals reviewed. Constitutional: He is oriented to person, place, and time. He appears well-developed and well-nourished.  HENT:  Head: Normocephalic and atraumatic.  Nose: Nose normal.  Mouth/Throat: No oropharyngeal exudate.  Eyes: Conjunctivae are normal. Pupils are equal, round, and reactive to light. No scleral icterus.  Neck: Normal range of motion. Neck supple. No JVD present. No tracheal  deviation present. No thyromegaly present.  Cardiovascular: Normal rate and regular rhythm.  Exam reveals no friction rub.   No murmur heard. Respiratory: Effort normal and breath sounds normal. No respiratory distress. He has no wheezes. He has no rales.  GI: Soft. Bowel sounds are normal. He exhibits no distension. There is no tenderness. There is no rebound and no guarding.  Musculoskeletal: Normal range of motion. He exhibits no edema or tenderness.  Neurological: He is alert and oriented to person, place, and time.  Skin: Skin is warm and dry. No rash noted. No erythema.  Psychiatric: He has a normal mood and affect. Thought content normal.    Assessment/Plan: 1. Acute kidney injury: No obvious preceding events noted from history with the exception of his fall about 4 weeks ago-this is unlikely to have had a significant effect on his renal function with a negative renal ultrasound. The most significant finding is on his urinalysis showing granular casts-suggestive of acute tubular necrosis (at this point of unclear if to pathology). The lack of hematuria and proteinuria points away from an acute glomerulonephritis and renal ultrasound is negative for any obstruction. With his age being the key risk factor, I will screen for plasma cell dyscrasia with SPEP/light chains and also rule out the possibility of ANCA vasculitis (low pretest suspicion given bland urinalysis). We'll discontinue his intravenous fluids and encourage him to eat and drink and ambulate around the hallways. Labs will be rechecked again tomorrow and if renal function stable or improved, he may be safely discharged to follow-up as an outpatient. I think that he is in the plateau phase of his ATN. There are no acute  indications for renal biopsy at this point. No indications of nephrotoxin exposure/AIN. He is nonoliguric. 2. Hypertension: He is not on antihypertensive therapy, continue to monitor for indicators to initiate  antihypertensive agents. 3. Anemia: Unclear etiopathology, check iron stores and replace if deficient. 4. Hypernatremia: Mild, encourage free water intake  Paije Goodhart K. 04/01/2014, 9:44 AM

## 2014-04-01 NOTE — Progress Notes (Signed)
Utilization review completed.  

## 2014-04-01 NOTE — Plan of Care (Signed)
Problem: Phase I Progression Outcomes Goal: Initial discharge plan identified Outcome: Completed/Met Date Met:  04/01/14 Goal: Pt./family verbalizes plan of care Outcome: Completed/Met Date Met:  04/01/14

## 2014-04-01 NOTE — Plan of Care (Signed)
Problem: Phase I Progression Outcomes Goal: OOB as tolerated unless otherwise ordered Outcome: Completed/Met Date Met:  04/01/14     

## 2014-04-02 LAB — BASIC METABOLIC PANEL
Anion gap: 15 (ref 5–15)
BUN: 32 mg/dL — ABNORMAL HIGH (ref 6–23)
CO2: 22 meq/L (ref 19–32)
CREATININE: 2.95 mg/dL — AB (ref 0.50–1.35)
Calcium: 8.6 mg/dL (ref 8.4–10.5)
Chloride: 106 mEq/L (ref 96–112)
GFR calc Af Amer: 20 mL/min — ABNORMAL LOW (ref >90)
GFR calc non Af Amer: 17 mL/min — ABNORMAL LOW (ref >90)
GLUCOSE: 89 mg/dL (ref 70–99)
Potassium: 4.5 mEq/L (ref 3.7–5.3)
Sodium: 143 mEq/L (ref 137–147)

## 2014-04-02 LAB — CBC
HCT: 30.2 % — ABNORMAL LOW (ref 39.0–52.0)
Hemoglobin: 9.9 g/dL — ABNORMAL LOW (ref 13.0–17.0)
MCH: 30.2 pg (ref 26.0–34.0)
MCHC: 32.8 g/dL (ref 30.0–36.0)
MCV: 92.1 fL (ref 78.0–100.0)
Platelets: 245 K/uL (ref 150–400)
RBC: 3.28 MIL/uL — ABNORMAL LOW (ref 4.22–5.81)
RDW: 13.7 % (ref 11.5–15.5)
WBC: 6.8 K/uL (ref 4.0–10.5)

## 2014-04-02 MED ORDER — AMLODIPINE BESYLATE 5 MG PO TABS: 5.0000 mg | ORAL_TABLET | Freq: Every day | ORAL | Status: DC

## 2014-04-02 NOTE — Progress Notes (Signed)
Patient Discharge:  Disposition: Pt discharged home  Education: Pt educated on diagnosis, medications, follow up appointments and all discharge instructions.   IV: Removed  Follow-up appointments: Reviewed with pt  Prescriptions: Sent to pt's pharmacy  Transportation: Transported home by family friend  Belongings:All belongings sent with pt.

## 2014-04-02 NOTE — Progress Notes (Signed)
Patient ID: Miguel Compton, male   DOB: January 13, 1922, 80 y.o.   MRN: [id number]  [facility] Progress Note   Assessment/ Plan:   1. Acute kidney injury: Nonoliguric and with evidence pointing to ATN (unfortunately of unclear etiology). Urine not suggestive of acute glomerulonephritis/RPGN and renal ultrasound negative for hydronephrosis. A NA, ANCA and screening for plasma cell dyscrasias is pending. Slightly elevated urine protein/creatinine ratio. He would be appropriate to discharge home today with recommendation to recheck labs (BMET) on 11/24 and again on 11/27-please fax report to [facility] at [phone]. I will arrange a renal follow-up for him to see me and contact him tomorrow. 2. Hypertension: He is not on antihypertensive therapy, will likely need to be started on a low-dose antihypertensive such as amlodipine 5 mg daily 3. Anemia: Unclear etiopathology, he does not have iron deficiency, continue to monitor. 4. Hypernatremia: Mild, encourage free water intake  Subjective:   Reports to be feeling well, had a better night last night after mattress was switched    Objective:   BP 172/69 mmHg  Pulse 58  Temp(Src) 97.8 F (36.6 C) (Oral)  Resp 16  Wt 84.823 kg (187 lb)  SpO2 96%  Intake/Output Summary (Last 24 hours) at 04/02/14 0936 Last data filed at 04/02/14 3428  Gross per 24 hour  Intake    120 ml  Output   1600 ml  Net  -1480 ml   Weight change:   Physical Exam: Gen: Comfortably resting up in his bed CVS: Pulse regular bradycardia, normal S1 and S2 Resp: Clear to auscultation, no rales/rhonchi Abd:, Soft, nontender, bowel sounds normal Ext: No lower extremity edema  Imaging: No results found.  Labs: BMET  Recent Labs Lab 03/31/14 1835 04/01/14 0525 04/02/14 0547  NA 137 144 143  K 4.7 4.3 4.5  CL 100 105 106  CO2 23 27 22   GLUCOSE 96 81 89  BUN 29* 31* 32*  CREATININE 2.98* 3.06* 2.95*  CALCIUM 8.9 8.8 8.6   CBC  Recent  Labs Lab 03/31/14 1835 04/01/14 0525 04/02/14 0547  WBC 8.1 7.5 6.8  HGB 10.6* 9.7* 9.9*  HCT 32.0* 28.7* 30.2*  MCV 91.2 92.9 92.1  PLT 262 260 245    Medications:    . aspirin EC  81 mg Oral Daily  . darifenacin  7.5 mg Oral Daily  . donepezil  5 mg Oral Daily  . fluticasone  1 puff Inhalation BID  . heparin  5,000 Units Subcutaneous 3 times per day  . mirtazapine  15 mg Oral QHS  . pantoprazole  40 mg Oral Daily  . pravastatin  80 mg Oral Daily   Elmarie Shiley, MD 04/02/2014, 9:36 AM

## 2014-04-02 NOTE — Plan of Care (Signed)
Problem: Phase I Progression Outcomes Goal: Pain controlled with appropriate interventions Outcome: Completed/Met Date Met:  04/02/14  Problem: Discharge Progression Outcomes Goal: Pain controlled with appropriate interventions Outcome: Completed/Met Date Met:  04/02/14 Uncomfortable state controlled by repositioning.

## 2014-04-02 NOTE — Plan of Care (Signed)
Problem: Phase I Progression Outcomes Goal: Dyspnea controlled at rest Outcome: Completed/Met Date Met:  04/02/14     

## 2014-04-02 NOTE — Discharge Summary (Signed)
Physician Discharge Summary  Miguel Compton QMG:[id number] DOB: 1922-03-12 DOA: 03/31/2014  PCP: Pcp Not In System  Admit date: 03/31/2014 Discharge date: 04/02/2014  Time spent: 45 minutes  Recommendations for Outpatient Follow-up:  1. Dr.Jay Patel with [facility], office will call 2. PCP Dr.John Micheal Likens in 1 week, he needs Bmet on Tuesday and Friday and results faxed to [facility] at [phone]  Discharge Diagnoses:  Principal Problem:   AKI (acute kidney injury) Active Problems:   Right flank pain   Essential hypertension   Urinary incontinence   Cognitive changes   GERD (gastroesophageal reflux disease)   Nocturnal polyuria   Discharge Condition: stable  Diet recommendation: low sodium  Filed Weights   03/31/14 2231  Weight: 84.823 kg (187 lb)    History of present illness:  80 year old Caucasian man with past medical history significant for CVA without significant residual neurological deficits, COPD, GERD, history of prostate problems in the past with overflow incontinence. He also has a history of dementia with depression/anxiety. He was referred to the emergency room for further evaluation and management of his acute renal failure-creatinine noted to be rising from 1.16 on 12/22/13 to 2.13 on 03/27/14 and 2.94 on 03/30/14. Renal ultrasound done on 03/31/14 shows mild bilateral renal cortical thinning without any hydronephrosis or renal mass. Right kidney 12.1 cm, left kidney 13.4 cm  Hospital Course:  1. Acute kidney injury: Nonoliguric and with evidence pointing to ATN (unfortunately of unclear etiology). Urine not suggestive of acute glomerulonephritis/RPGN and renal ultrasound negative for hydronephrosis. ANA, ANCA and screening for plasma cell dyscrasias is pending. Slightly elevated urine protein/creatinine ratio.  -followed by Dr.Patel this hospitalization, since his creatinine is not rising and plateaued, he is felt to be  appropriate to discharge home  with recommendation to recheck labs (BMET) on 11/24 and again on 11/27-please fax report to [facility] at [phone]. Dr.Patel will arrange a renal follow-up for him to see me and contact him tomorrow. 2. Hypertension: He is not on antihypertensive therapy, will likely need to be started on a low-dose antihypertensive such as amlodipine 5 mg daily 3. Anemia: Unclear etiopathology, he does not have iron deficiency, continue to monitor.  Consultations:  Renal Dr.Patel  Discharge Exam: Filed Vitals:   04/02/14 0450  BP: 172/69  Pulse: 58  Temp: 97.8 F (36.6 C)  Resp: 16    General: AAOx3 Cardiovascular: S1S2/RRR Respiratory: CTAB  Discharge Instructions You were cared for by a hospitalist during your hospital stay. If you have any questions about your discharge medications or the care you received while you were in the hospital after you are discharged, you can call the unit and asked to speak with the hospitalist on call if the hospitalist that took care of you is not available. Once you are discharged, your primary care physician will handle any further medical issues. Please note that NO REFILLS for any discharge medications will be authorized once you are discharged, as it is imperative that you return to your primary care physician (or establish a relationship with a primary care physician if you do not have one) for your aftercare needs so that they can reassess your need for medications and monitor your lab values.  Discharge Instructions    Diet - low sodium heart healthy    Complete by:  As directed      Increase activity slowly    Complete by:  As directed  Current Discharge Medication List    START taking these medications   Details  amLODipine (NORVASC) 5 MG tablet Take 1 tablet (5 mg total) by mouth daily. Qty: 30 tablet, Refills: 0      CONTINUE these medications which have NOT CHANGED   Details  albuterol  (PROVENTIL HFA;VENTOLIN HFA) 108 (90 BASE) MCG/ACT inhaler Inhale 1-2 puffs into the lungs every 4 (four) hours as needed for wheezing or shortness of breath.    ALPRAZolam (XANAX) 0.25 MG tablet Take 0.25 mg by mouth daily as needed for anxiety.    aspirin EC 81 MG tablet Take 81 mg by mouth daily.    Cyanocobalamin (B-12 PO) Take by mouth.    donepezil (ARICEPT) 5 MG tablet Take 5 mg by mouth daily. Refills: 11    fluticasone (FLOVENT DISKUS) 50 MCG/BLIST diskus inhaler Inhale 1 puff into the lungs 2 (two) times daily.    mirtazapine (REMERON) 15 MG tablet Take 15 mg by mouth at bedtime.    Omega-3 Fatty Acids (FISH OIL) 1000 MG CAPS Take by mouth.    omeprazole (PRILOSEC) 20 MG capsule Take 20 mg by mouth daily. Refills: 2    pravastatin (PRAVACHOL) 80 MG tablet Take 80 mg by mouth daily. Refills: 1    VESICARE 5 MG tablet Take 5 mg by mouth daily. Refills: 2       No Known Allergies Follow-up Information    Follow up with [name], MD. Schedule an appointment as soon as possible for a visit in 1 week.   Specialty:  Family Medicine   Contact information:   [address] [place] [address] [phone]        The results of significant diagnostics from this hospitalization (including imaging, microbiology, ancillary and laboratory) are listed below for reference.    Significant Diagnostic Studies: No results found.  Microbiology: No results found for this or any previous visit (from the past 240 hour(s)).   Labs: Basic Metabolic Panel:  Recent Labs Lab 03/31/14 1835 04/01/14 0525 04/02/14 0547  NA 137 144 143  K 4.7 4.3 4.5  CL 100 105 106  CO2 23 27 22   GLUCOSE 96 81 89  BUN 29* 31* 32*  CREATININE 2.98* 3.06* 2.95*  CALCIUM 8.9 8.8 8.6   Liver Function Tests:  Recent Labs Lab 04/01/14 0525  AST 19  ALT 10  ALKPHOS 53  BILITOT 0.3  PROT 6.8  ALBUMIN 2.4*   No results for input(s): LIPASE, AMYLASE in the last 168 hours. No  results for input(s): AMMONIA in the last 168 hours. CBC:  Recent Labs Lab 03/31/14 1835 04/01/14 0525 04/02/14 0547  WBC 8.1 7.5 6.8  HGB 10.6* 9.7* 9.9*  HCT 32.0* 28.7* 30.2*  MCV 91.2 92.9 92.1  PLT 262 260 245   Cardiac Enzymes: No results for input(s): CKTOTAL, CKMB, CKMBINDEX, TROPONINI in the last 168 hours. BNP: BNP (last 3 results) No results for input(s): PROBNP in the last 8760 hours. CBG: No results for input(s): GLUCAP in the last 168 hours.     SignedDomenic Polite  [facility] 04/02/2014, 9:54 AM

## 2014-04-02 NOTE — Progress Notes (Signed)
Pt refuses bed alarm at this time. Provided education regarding safety and risks for falls. Pt verbalizes understanding and continues to refuse. Will continue to monitor with door open.   Esaw Dace, RN 04/02/2014, 5:23 AM

## 2014-04-03 LAB — ANA: Anti Nuclear Antibody(ANA): NEGATIVE

## 2014-04-03 LAB — KAPPA/LAMBDA LIGHT CHAINS
KAPPA, LAMDA LIGHT CHAIN RATIO: 0.06 — AB (ref 0.26–1.65)
Kappa free light chain: 2.67 mg/dL — ABNORMAL HIGH (ref 0.33–1.94)
Lambda free light chains: 47.4 mg/dL — ABNORMAL HIGH (ref 0.57–2.63)

## 2014-04-04 LAB — MPO/PR-3 (ANCA) ANTIBODIES
Myeloperoxidase Abs: <1
Serine Protease 3: <1

## 2014-04-05 LAB — PROTEIN ELECTROPHORESIS, SERUM
Albumin ELP: 42.4 % — ABNORMAL LOW (ref 55.8–66.1)
Alpha-1-Globulin: 4.5 % (ref 2.9–4.9)
Alpha-2-Globulin: 11.2 % (ref 7.1–11.8)
BETA 2: 33.2 % — AB (ref 3.2–6.5)
Beta Globulin: 5.9 % (ref 4.7–7.2)
Gamma Globulin: 2.8 % — ABNORMAL LOW (ref 11.1–18.8)
M-Spike, %: 2.08 g/dL
TOTAL PROTEIN ELP: 7 g/dL (ref 6.0–8.3)

## 2014-04-17 ENCOUNTER — Other Ambulatory Visit: Payer: Self-pay | Admitting: Oncology

## 2014-04-17 DIAGNOSIS — C9000 Multiple myeloma not having achieved remission: Secondary | ICD-10-CM

## 2014-04-18 ENCOUNTER — Encounter (HOSPITAL_COMMUNITY): Payer: Self-pay | Admitting: Emergency Medicine

## 2014-04-18 ENCOUNTER — Emergency Department (HOSPITAL_COMMUNITY): Payer: Medicare Other

## 2014-04-18 ENCOUNTER — Telehealth: Payer: Self-pay | Admitting: Oncology

## 2014-04-18 ENCOUNTER — Inpatient Hospital Stay (HOSPITAL_COMMUNITY)
Admission: EM | Admit: 2014-04-18 | Discharge: 2014-05-04 | DRG: 840 | Disposition: A | Discharge status: Home / Self Care | Payer: Medicare Other | Attending: Family Medicine | Admitting: Family Medicine

## 2014-04-18 DIAGNOSIS — Z87891 Personal history of nicotine dependence: Secondary | ICD-10-CM

## 2014-04-18 DIAGNOSIS — N170 Acute kidney failure with tubular necrosis: Secondary | ICD-10-CM | POA: Diagnosis present

## 2014-04-18 DIAGNOSIS — R4189 Other symptoms and signs involving cognitive functions and awareness: Secondary | ICD-10-CM | POA: Diagnosis present

## 2014-04-18 DIAGNOSIS — E875 Hyperkalemia: Secondary | ICD-10-CM

## 2014-04-18 DIAGNOSIS — G629 Polyneuropathy, unspecified: Secondary | ICD-10-CM | POA: Diagnosis present

## 2014-04-18 DIAGNOSIS — E669 Obesity, unspecified: Secondary | ICD-10-CM | POA: Diagnosis present

## 2014-04-18 DIAGNOSIS — E876 Hypokalemia: Secondary | ICD-10-CM | POA: Diagnosis not present

## 2014-04-18 DIAGNOSIS — T451X5A Adverse effect of antineoplastic and immunosuppressive drugs, initial encounter: Secondary | ICD-10-CM | POA: Diagnosis not present

## 2014-04-18 DIAGNOSIS — Z8546 Personal history of malignant neoplasm of prostate: Secondary | ICD-10-CM

## 2014-04-18 DIAGNOSIS — K219 Gastro-esophageal reflux disease without esophagitis: Secondary | ICD-10-CM | POA: Diagnosis present

## 2014-04-18 DIAGNOSIS — Z8673 Personal history of transient ischemic attack (TIA), and cerebral infarction without residual deficits: Secondary | ICD-10-CM | POA: Diagnosis not present

## 2014-04-18 DIAGNOSIS — K449 Diaphragmatic hernia without obstruction or gangrene: Secondary | ICD-10-CM | POA: Diagnosis present

## 2014-04-18 DIAGNOSIS — F329 Major depressive disorder, single episode, unspecified: Secondary | ICD-10-CM | POA: Diagnosis present

## 2014-04-18 DIAGNOSIS — D72829 Elevated white blood cell count, unspecified: Secondary | ICD-10-CM | POA: Diagnosis present

## 2014-04-18 DIAGNOSIS — I120 Hypertensive chronic kidney disease with stage 5 chronic kidney disease or end stage renal disease: Secondary | ICD-10-CM | POA: Diagnosis present

## 2014-04-18 DIAGNOSIS — T380X5A Adverse effect of glucocorticoids and synthetic analogues, initial encounter: Secondary | ICD-10-CM | POA: Diagnosis present

## 2014-04-18 DIAGNOSIS — D638 Anemia in other chronic diseases classified elsewhere: Secondary | ICD-10-CM | POA: Diagnosis present

## 2014-04-18 DIAGNOSIS — Z7982 Long term (current) use of aspirin: Secondary | ICD-10-CM

## 2014-04-18 DIAGNOSIS — C9000 Multiple myeloma not having achieved remission: Secondary | ICD-10-CM | POA: Diagnosis present

## 2014-04-18 DIAGNOSIS — E871 Hypo-osmolality and hyponatremia: Secondary | ICD-10-CM | POA: Diagnosis present

## 2014-04-18 DIAGNOSIS — Z6827 Body mass index (BMI) 27.0-27.9, adult: Secondary | ICD-10-CM | POA: Diagnosis not present

## 2014-04-18 DIAGNOSIS — F0390 Unspecified dementia without behavioral disturbance: Secondary | ICD-10-CM | POA: Diagnosis present

## 2014-04-18 DIAGNOSIS — F419 Anxiety disorder, unspecified: Secondary | ICD-10-CM | POA: Diagnosis present

## 2014-04-18 DIAGNOSIS — E785 Hyperlipidemia, unspecified: Secondary | ICD-10-CM | POA: Diagnosis present

## 2014-04-18 DIAGNOSIS — I10 Essential (primary) hypertension: Secondary | ICD-10-CM

## 2014-04-18 DIAGNOSIS — N179 Acute kidney failure, unspecified: Secondary | ICD-10-CM

## 2014-04-18 DIAGNOSIS — J449 Chronic obstructive pulmonary disease, unspecified: Secondary | ICD-10-CM | POA: Diagnosis present

## 2014-04-18 DIAGNOSIS — N186 End stage renal disease: Secondary | ICD-10-CM | POA: Diagnosis present

## 2014-04-18 DIAGNOSIS — K5900 Constipation, unspecified: Secondary | ICD-10-CM | POA: Diagnosis present

## 2014-04-18 DIAGNOSIS — E780 Pure hypercholesterolemia: Secondary | ICD-10-CM | POA: Diagnosis present

## 2014-04-18 DIAGNOSIS — N289 Disorder of kidney and ureter, unspecified: Secondary | ICD-10-CM

## 2014-04-18 HISTORY — DX: Disorder of kidney and ureter, unspecified: N28.9

## 2014-04-18 HISTORY — DX: Unspecified malignant neoplasm of skin of unspecified part of face: C44.300

## 2014-04-18 HISTORY — DX: Gastro-esophageal reflux disease without esophagitis: K21.9

## 2014-04-18 HISTORY — DX: Unspecified osteoarthritis, unspecified site: M19.90

## 2014-04-18 HISTORY — DX: Polyneuropathy, unspecified: G62.9

## 2014-04-18 HISTORY — DX: Essential (primary) hypertension: I10

## 2014-04-18 HISTORY — DX: Malignant neoplasm of prostate: C61

## 2014-04-18 LAB — CBC WITH DIFFERENTIAL/PLATELET
BASOS ABS: 0 10*3/uL (ref 0.0–0.1)
BASOS PCT: 0 % (ref 0–1)
Eosinophils Absolute: 0 10*3/uL (ref 0.0–0.7)
Eosinophils Relative: 0 % (ref 0–5)
HEMATOCRIT: 31.7 % — AB (ref 39.0–52.0)
Hemoglobin: 10.7 g/dL — ABNORMAL LOW (ref 13.0–17.0)
LYMPHS PCT: 7 % — AB (ref 12–46)
Lymphs Abs: 1 10*3/uL (ref 0.7–4.0)
MCH: 30.6 pg (ref 26.0–34.0)
MCHC: 33.8 g/dL (ref 30.0–36.0)
MCV: 90.6 fL (ref 78.0–100.0)
Monocytes Absolute: 0.3 10*3/uL (ref 0.1–1.0)
Monocytes Relative: 2 % — ABNORMAL LOW (ref 3–12)
NEUTROS PCT: 91 % — AB (ref 43–77)
Neutro Abs: 13.4 10*3/uL — ABNORMAL HIGH (ref 1.7–7.7)
PLATELETS: 291 10*3/uL (ref 150–400)
RBC: 3.5 MIL/uL — ABNORMAL LOW (ref 4.22–5.81)
RDW: 12.9 % (ref 11.5–15.5)
WBC: 14.7 10*3/uL — ABNORMAL HIGH (ref 4.0–10.5)

## 2014-04-18 LAB — URINE MICROSCOPIC-ADD ON

## 2014-04-18 LAB — URINALYSIS, ROUTINE W REFLEX MICROSCOPIC
Bilirubin Urine: NEGATIVE
GLUCOSE, UA: NEGATIVE mg/dL
Ketones, ur: NEGATIVE mg/dL
LEUKOCYTES UA: NEGATIVE
Nitrite: NEGATIVE
PH: 6 (ref 5.0–8.0)
PROTEIN: 30 mg/dL — AB
Specific Gravity, Urine: 1.008 (ref 1.005–1.030)
Urobilinogen, UA: 0.2 mg/dL (ref 0.0–1.0)

## 2014-04-18 LAB — COMPREHENSIVE METABOLIC PANEL
ALBUMIN: 2.2 g/dL — AB (ref 3.5–5.2)
ALT: 13 U/L (ref 0–53)
AST: 22 U/L (ref 0–37)
Alkaline Phosphatase: 72 U/L (ref 39–117)
Anion gap: 21 — ABNORMAL HIGH (ref 5–15)
BILIRUBIN TOTAL: 0.3 mg/dL (ref 0.3–1.2)
BUN: 75 mg/dL — ABNORMAL HIGH (ref 6–23)
CO2: 21 mEq/L (ref 19–32)
CREATININE: 9.24 mg/dL — AB (ref 0.50–1.35)
Calcium: 8.1 mg/dL — ABNORMAL LOW (ref 8.4–10.5)
Chloride: 87 mEq/L — ABNORMAL LOW (ref 96–112)
GFR calc Af Amer: 5 mL/min — ABNORMAL LOW (ref >90)
GFR calc non Af Amer: 4 mL/min — ABNORMAL LOW (ref >90)
Glucose, Bld: 159 mg/dL — ABNORMAL HIGH (ref 70–99)
Potassium: 5.5 mEq/L — ABNORMAL HIGH (ref 3.7–5.3)
Sodium: 129 mEq/L — ABNORMAL LOW (ref 137–147)
Total Protein: 7.9 g/dL (ref 6.0–8.3)

## 2014-04-18 LAB — TYPE AND SCREEN
ABO/RH(D): A POS
Antibody Screen: NEGATIVE

## 2014-04-18 LAB — PROTIME-INR
INR: 1.17 (ref 0.00–1.49)
PROTHROMBIN TIME: 15.1 s (ref 11.6–15.2)

## 2014-04-18 LAB — ABO/RH: ABO/RH(D): A POS

## 2014-04-18 LAB — APTT: aPTT: 31 seconds (ref 24–37)

## 2014-04-18 MED ORDER — ALBUTEROL SULFATE (2.5 MG/3ML) 0.083% IN NEBU: 2.5000 mg | INHALATION_SOLUTION | RESPIRATORY_TRACT | Status: DC | PRN

## 2014-04-18 MED ORDER — ALBUTEROL SULFATE HFA 108 (90 BASE) MCG/ACT IN AERS: 1.0000 | INHALATION_SPRAY | RESPIRATORY_TRACT | Status: DC | PRN

## 2014-04-18 MED ORDER — PANTOPRAZOLE SODIUM 40 MG PO TBEC
40.0000 mg | DELAYED_RELEASE_TABLET | Freq: Every day | ORAL | Status: DC
  Administered 2014-04-18 – 2014-05-04 (×17): 40 mg via ORAL
  Filled 2014-04-18 (×16): qty 1

## 2014-04-18 MED ORDER — DEXAMETHASONE 4 MG PO TABS
4.0000 mg | ORAL_TABLET | Freq: Two times a day (BID) | ORAL | Status: DC
  Administered 2014-04-18 – 2014-04-27 (×17): 4 mg via ORAL
  Filled 2014-04-18 (×19): qty 1

## 2014-04-18 MED ORDER — PRAVASTATIN SODIUM 80 MG PO TABS
80.0000 mg | ORAL_TABLET | Freq: Every day | ORAL | Status: DC
  Administered 2014-04-18 – 2014-05-03 (×16): 80 mg via ORAL
  Filled 2014-04-18 (×17): qty 1

## 2014-04-18 MED ORDER — SODIUM POLYSTYRENE SULFONATE 15 GM/60ML PO SUSP
30.0000 g | Freq: Once | ORAL | Status: AC
  Administered 2014-04-18: 30 g via ORAL
  Filled 2014-04-18: qty 120

## 2014-04-18 MED ORDER — SODIUM CHLORIDE 0.9 % IJ SOLN
3.0000 mL | Freq: Two times a day (BID) | INTRAMUSCULAR | Status: DC
  Administered 2014-04-18 – 2014-05-03 (×31): 3 mL via INTRAVENOUS

## 2014-04-18 MED ORDER — FLUTICASONE PROPIONATE HFA 44 MCG/ACT IN AERO
1.0000 | INHALATION_SPRAY | Freq: Two times a day (BID) | RESPIRATORY_TRACT | Status: DC
  Administered 2014-04-18 – 2014-05-04 (×29): 1 via RESPIRATORY_TRACT
  Filled 2014-04-18: qty 10.6

## 2014-04-18 MED ORDER — FLUTICASONE PROPIONATE (INHAL) 50 MCG/BLIST IN AEPB: 1.0000 | INHALATION_SPRAY | Freq: Two times a day (BID) | RESPIRATORY_TRACT | Status: DC

## 2014-04-18 MED ORDER — HEPARIN SODIUM (PORCINE) 5000 UNIT/ML IJ SOLN: 5000.0000 [IU] | Freq: Three times a day (TID) | INTRAMUSCULAR | Status: DC

## 2014-04-18 MED ORDER — AMLODIPINE BESYLATE 5 MG PO TABS
5.0000 mg | ORAL_TABLET | Freq: Every day | ORAL | Status: DC
  Administered 2014-04-18 – 2014-04-19 (×2): 5 mg via ORAL
  Filled 2014-04-18 (×2): qty 1

## 2014-04-18 MED ORDER — ALPRAZOLAM 0.25 MG PO TABS
0.2500 mg | ORAL_TABLET | Freq: Every day | ORAL | Status: DC | PRN
  Administered 2014-04-19 – 2014-05-03 (×13): 0.25 mg via ORAL
  Filled 2014-04-18 (×14): qty 1

## 2014-04-18 NOTE — ED Notes (Signed)
Pt here with decreased urination and decreased kidney function per family sent here by PCP; pt with likely new diagnosis multiple myeloma

## 2014-04-18 NOTE — Progress Notes (Signed)
Admission note:  Arrival Method: Patient arrived on stretcher to the unit. Mental Orientation: Alert and oriented x 4.  Telemetry: He is on Telemetry 6E-09, CCMD notified. Assessment: See doc flow-sheets. Skin: Warm, dry and intact. IV: IV access on Right fore arm, clean dry and intact.  Saline locked. Pain: Denies any pain at present. Fall Prevention Safety Plan: Patient educated about fall prevention safety plan, understood and acknowledged. Admission Screening: In progress. 6700 Orientation: Patient has been oriented to the unit, staff and to the room.

## 2014-04-18 NOTE — Telephone Encounter (Signed)
S/W [facility] @ DR. PATEL OFFICE AND GAVE NP APPT FOR 12/11 @ 3 W/DR. MAGRINAT.  REFERRING DR. PATEL DX- MYELOMA

## 2014-04-18 NOTE — H&P (Addendum)
History and Physical    Miguel Compton TXH:[id number] DOB: 10/18/1921 DOA: 04/18/2014  Referring physician: Dr. Eulis Foster PCP: Pcp Not In System  Specialists: Nephrology, Dr. Joelyn Oms  Chief Complaint: abnormal labs  HPI: Miguel Compton is a 80 y.o. male has a past medical history significant for COPD, hypertension, hyperlipidemia, mild dementia, history of CVA, presents to the emergency room as he was sent by his doctor for abnormal blood work. He was recently hospitalized here and discharge about 2 weeks ago with fatigue and found to be in acute renal failure. Nephrology was consulted at that time, and is believed to have ATN of unclear etiology. Patient's renal function stabilized and he was discharged home. SPEP obtained during this hospitalization was however concerning for multiple myeloma. He was seen by Dr. Posey Pronto yesterday, and was started on Decadron last evening. His blood work yesterday in the clinic showed a renal failure and a potassium of 6.5, and patient was called in Kayexalate. Repeat blood work today shows worsening creatinine, potassium was 5.5, and he was directed to come to the emergency room for further treatment. Patient has no complaints, denies any chest pain or breathing difficulties, denies any fluid buildup, lower extremity swelling or weight gain. Denies any fever or chills. He denies any nausea. He endorses decreased urination over the past 3 weeks. In the emergency room, patient is stable vital signs, has mild hyponatremia with a sodium of 129, potassium 5.5, BUN 75, creatinine 9.24. He has mild leukocytosis of 14.7 and mild anemia at 10.7.   Review of Systems: As per history of present illness, otherwise negative   Past Medical History  Diagnosis Date  . COPD (chronic obstructive pulmonary disease)   . High cholesterol   . Dementia   . Depression   . Anxiety   . Stroke   . Reflux esophagitis    History reviewed. No pertinent past surgical history. Social History:   reports that he has quit smoking. He does not have any smokeless tobacco history on file. He reports that he does not drink alcohol. His drug history is not on file.  No Known Allergies  History reviewed. No pertinent family history.  Prior to Admission medications   Medication Sig Start Date End Date Taking? Authorizing Provider  albuterol (PROVENTIL HFA;VENTOLIN HFA) 108 (90 BASE) MCG/ACT inhaler Inhale 1-2 puffs into the lungs every 4 (four) hours as needed for wheezing or shortness of breath.    Historical Provider, MD  ALPRAZolam Duanne Moron) 0.25 MG tablet Take 0.25 mg by mouth daily as needed for anxiety.    Historical Provider, MD  amLODipine (NORVASC) 5 MG tablet Take 1 tablet (5 mg total) by mouth daily. 04/02/14   Domenic Polite, MD  aspirin EC 81 MG tablet Take 81 mg by mouth daily.    Historical Provider, MD  Cyanocobalamin (B-12 PO) Take by mouth.    Historical Provider, MD  donepezil (ARICEPT) 5 MG tablet Take 5 mg by mouth daily. 03/11/14   Historical Provider, MD  fluticasone (FLOVENT DISKUS) 50 MCG/BLIST diskus inhaler Inhale 1 puff into the lungs 2 (two) times daily.    Historical Provider, MD  mirtazapine (REMERON) 15 MG tablet Take 15 mg by mouth at bedtime.    Historical Provider, MD  Omega-3 Fatty Acids (FISH OIL) 1000 MG CAPS Take by mouth.    Historical Provider, MD  omeprazole (PRILOSEC) 20 MG capsule Take 20 mg by mouth daily. 03/11/14   Historical Provider, MD  pravastatin (PRAVACHOL) 80 MG  tablet Take 80 mg by mouth daily. 03/11/14   Historical Provider, MD  VESICARE 5 MG tablet Take 5 mg by mouth daily. 01/10/14   Historical Provider, MD   Physical Exam: Filed Vitals:   04/18/14 1309 04/18/14 1430 04/18/14 1445 04/18/14 1614  BP: 177/79 158/82 161/71 166/63  Pulse: 80 77 78 76  Temp: 98 F (36.7 C)     TempSrc: Oral     Resp: _0 SpO2: 95% 98% 96% 98%     General:  no acute distress, pleasant 80 year old gentleman looking younger than stated age    Eyes: no scleral icterus  ENT: moist oropharynx  Neck: supple, no JVD  Cardiovascular: regular rate, 3/6 systolic ejection murmur; 2+ peripheral pulses  Respiratory: CTA biL, good air movement without wheezing  or crackles  Abdomen: soft, non tender to palpation, positive bowel sounds, no guarding, no rebound  Skin: no rashes  Musculoskeletal: no peripheral edema  Psychiatric: normal mood and affect  Neurologic: Nonfocal  Labs on Admission:  Basic Metabolic Panel:  Recent Labs Lab 04/18/14 1311  NA 129*  K 5.5*  CL 87*  CO2 21  GLUCOSE 159*  BUN 75*  CREATININE 9.24*  CALCIUM 8.1*   Liver Function Tests:  Recent Labs Lab 04/18/14 1311  AST 22  ALT 13  ALKPHOS 72  BILITOT 0.3  PROT 7.9  ALBUMIN 2.2*   CBC:  Recent Labs Lab 04/18/14 1311  WBC 14.7*  NEUTROABS 13.4*  HGB 10.7*  HCT 31.7*  MCV 90.6  PLT 291   Radiological Exams on Admission: Dg Chest 2 View  04/18/2014   CLINICAL DATA:  ABNORMAL LAB, shortness of breath, COPD  EXAM: CHEST - 2 VIEW  COMPARISON:  08/25/2011  FINDINGS: Coarse bilateral interstitial opacities left greater than right, with relative sparing of the apices, increased since previous exam. Heart size is normal. No effusion. Visualized skeletal structures are unremarkable.  IMPRESSION: 1. Progressive coarse bilateral asymmetric interstitial opacities, probably largely chronic.   Electronically Signed   By: Arne Cleveland M.D.   On: 04/18/2014 15:36    EKG: Independently reviewed. Sinus rhythm  Assessment/Plan Principal Problem:   Renal insufficiency Active Problems:   Essential hypertension   Cognitive changes   Acute renal failure   Acute renal failure  - likely in the setting of newly diagnosed multiple myeloma. Dr. Nyra Capes from nephrology was consulted in the emergency room, patient does not have any acute needs for dialysis right now, however needs close monitoring, and will have a tunneled HD cath tomorrow. Will  admit to telemetry.Plan for renal biopsy tomorrow, will hold Aspirin and heparin prophylaxis. - obtain complements  Multiple myeloma - patient will need oncology consult. He is 80 years old, however he is highly functional. - continue decadron  Hyperkalemia - will monitor patient on telemetry, per nephrology would not treat potassium less than 6  Hyponatremia - mildly hypervolemic, place on renal diet with fluid restriction  History of CVA - non focal on exam, stable  HTN - restart Amlodipine   Leukocytosis - no fever, UA clear, CXR without focal infiltrates, likely related to recent steroids  Diet: renal with fluid restriction Fluids: none DVT Prophylaxis: SCD  Code Status: Full  Family Communication: d/w daughter bedside  Disposition Plan: admit to telemetry   Time spent: 63  Costin M. Cruzita Lederer, MD [facility] Pager [phone]  If 7PM-7AM, please contact night-coverage [email or web address] Password [facility] 04/18/2014, 4:33 PM

## 2014-04-18 NOTE — ED Provider Notes (Signed)
CSN: [id number]     Arrival date & time 04/18/14  1302 History   First MD Initiated Contact with Patient 04/18/14 1356     Chief Complaint  Patient presents with  . Abnormal Lab     (Consider location/radiation/quality/duration/timing/severity/associated sxs/prior Treatment) HPI Pt is a 80yo male with hx of COPD, high cholesterol, dementia, depression, anxiety and stroke w/o residual neurologic symptoms, presenting to ED as advised by his PCP due to abnormal labs, concern for new diagnosis of multiple myeloma.  Pt was admitted on 03/31/14 for new onset ARF w/o obvious cause at the time.  Labs for multiple myeloma and complete diagnosis from oncology still pending per family.  Pt is followed by Dr. Posey Pronto, [facility].  Pt was found to have a Cr of 9.24 today, which it was only 2.95 when he was admitted initially.  Pt's daughter states he was started on a steroid yesterday for the likely multiple myeloma as well as a medication to help lower his potassium because it was "about 6."  Pt reports gradually worsening generalized weakness, fatigue, as well as SOB but denies chest pain or leg swelling. Denies hx of CAD or CHF.  Denies fever or chills. Per family, pt does appear more jaundice than usual.   PCP: Dr. [name] in [place], [place]  Past Medical History  Diagnosis Date  . COPD (chronic obstructive pulmonary disease)   . High cholesterol   . Dementia   . Depression   . Anxiety   . Stroke   . Reflux esophagitis    History reviewed. No pertinent past surgical history. History reviewed. No pertinent family history. History  Substance Use Topics  . Smoking status: Former Research scientist (life sciences)  . Smokeless tobacco: Not on file  . Alcohol Use: No    Review of Systems  Constitutional: Positive for fatigue. Negative for chills.  Respiratory: Positive for shortness of breath. Negative for cough.   Cardiovascular: Negative for chest pain, palpitations and leg swelling.  Gastrointestinal:  Negative for nausea, vomiting, abdominal pain and diarrhea.  Genitourinary: Positive for urgency and decreased urine volume. Negative for dysuria and hematuria.  Musculoskeletal: Positive for back pain ( lower ). Negative for myalgias.  Neurological: Positive for weakness ( generalized).  All other systems reviewed and are negative.     Allergies  Review of patient's allergies indicates no known allergies.  Home Medications   Prior to Admission medications   Medication Sig Start Date End Date Taking? Authorizing Provider  albuterol (PROVENTIL HFA;VENTOLIN HFA) 108 (90 BASE) MCG/ACT inhaler Inhale 1-2 puffs into the lungs every 4 (four) hours as needed for wheezing or shortness of breath.    Historical Provider, MD  ALPRAZolam Duanne Moron) 0.25 MG tablet Take 0.25 mg by mouth daily as needed for anxiety.    Historical Provider, MD  amLODipine (NORVASC) 5 MG tablet Take 1 tablet (5 mg total) by mouth daily. 04/02/14   Domenic Polite, MD  aspirin EC 81 MG tablet Take 81 mg by mouth daily.    Historical Provider, MD  Cyanocobalamin (B-12 PO) Take by mouth.    Historical Provider, MD  donepezil (ARICEPT) 5 MG tablet Take 5 mg by mouth daily. 03/11/14   Historical Provider, MD  fluticasone (FLOVENT DISKUS) 50 MCG/BLIST diskus inhaler Inhale 1 puff into the lungs 2 (two) times daily.    Historical Provider, MD  mirtazapine (REMERON) 15 MG tablet Take 15 mg by mouth at bedtime.    Historical Provider, MD  Omega-3 Fatty Acids ([place]  OIL) 1000 MG CAPS Take by mouth.    Historical Provider, MD  omeprazole (PRILOSEC) 20 MG capsule Take 20 mg by mouth daily. 03/11/14   Historical Provider, MD  pravastatin (PRAVACHOL) 80 MG tablet Take 80 mg by mouth daily. 03/11/14   Historical Provider, MD  VESICARE 5 MG tablet Take 5 mg by mouth daily. 01/10/14   Historical Provider, MD   BP 161/71 mmHg  Pulse 78  Temp(Src) 98 F (36.7 C) (Oral)  Resp 20  SpO2 96% Physical Exam  Constitutional: He appears  well-developed and well-nourished.  Pt in NAD, appears younger than stated age.  HENT:  Head: Normocephalic and atraumatic.  Eyes: Conjunctivae are normal. No scleral icterus.  Neck: Normal range of motion.  Cardiovascular: Normal rate, regular rhythm and normal heart sounds.   Regular rate and rhythm  Pulmonary/Chest: Effort normal. No respiratory distress. He has no wheezes. He has rales. He exhibits no tenderness.  Mild SOB, appears winded between sentences, no accessory muscle use. Crackles in lower lung fields.   Abdominal: Soft. Bowel sounds are normal. He exhibits no distension and no mass. There is no tenderness. There is no rebound and no guarding.  Soft, non-distended, non-tender  Musculoskeletal: Normal range of motion.  Neurological: He is alert.  Skin: Skin is warm and dry.  Face appears jaundice  Nursing note and vitals reviewed.   ED Course  Procedures (including critical care time) Labs Review Labs Reviewed  CBC WITH DIFFERENTIAL - Abnormal; Notable for the following:    WBC 14.7 (*)    RBC 3.50 (*)    Hemoglobin 10.7 (*)    HCT 31.7 (*)    Neutrophils Relative % 91 (*)    Neutro Abs 13.4 (*)    Lymphocytes Relative 7 (*)    Monocytes Relative 2 (*)    All other components within normal limits  COMPREHENSIVE METABOLIC PANEL - Abnormal; Notable for the following:    Sodium 129 (*)    Potassium 5.5 (*)    Chloride 87 (*)    Glucose, Bld 159 (*)    BUN 75 (*)    Creatinine, Ser 9.24 (*)    Calcium 8.1 (*)    Albumin 2.2 (*)    GFR calc non Af Amer 4 (*)    GFR calc Af Amer 5 (*)    Anion gap 21 (*)    All other components within normal limits  URINALYSIS, ROUTINE W REFLEX MICROSCOPIC - Abnormal; Notable for the following:    Hgb urine dipstick SMALL (*)    Protein, ur 30 (*)    All other components within normal limits  URINE MICROSCOPIC-ADD ON    Imaging Review Dg Chest 2 View  04/18/2014   CLINICAL DATA:  ABNORMAL LAB, shortness of breath, COPD   EXAM: CHEST - 2 VIEW  COMPARISON:  08/25/2011  FINDINGS: Coarse bilateral interstitial opacities left greater than right, with relative sparing of the apices, increased since previous exam. Heart size is normal. No effusion. Visualized skeletal structures are unremarkable.  IMPRESSION: 1. Progressive coarse bilateral asymmetric interstitial opacities, probably largely chronic.   Electronically Signed   By: Arne Cleveland M.D.   On: 04/18/2014 15:36     EKG Interpretation None      MDM   Final diagnoses:  Acute renal failure, unspecified acute renal failure type  Hyperkalemia    Pt is a 80yo male sent to ED by PCP after f/u labs from recent admission for ARF and suspected multiple  myeloma. Pt found to have elevated Cr of 9.24 and Potassium of 5.5.  Pt was given kayexalate yesterday as well as steroids for suspected multiple myeloma.  Pt c/o SOB, fatigue, and decreased urination. Pt able to provide small urine sample in ED.  Bladder scan: 80m no evidence of obstruction. Discussed pt with  Dr. WEulis Foster whill consult with Dr. PPosey Prontoas pt will likely need to be admitted again due to acute renal failure and and hyperkalemia.  Dr. WEulis Fosterconsulted with Dr. SJoelyn Oms nephrology, agreed to f/u with pt but requested pt be admitted to medicine service.  Dr. SJoelyn Omsstated he will examine pt to determine need for dialysis.  [place]ors [place]   [facility], will consult hospitalist to admit.  4:07 PM Consulted with Dr. GCruzita Lederer agreed to admit pt to [facility] tele bed. Pt is stable at this time.       ENoland Fordyce PA-C 04/18/14 [place] MD 04/19/14 1030

## 2014-04-18 NOTE — ED Notes (Signed)
Bladder scan showed 58ml

## 2014-04-18 NOTE — Consult Note (Signed)
Miguel Compton Admit Date: 04/18/2014 04/18/2014 Miguel Compton Requesting Physician:  Eulis Foster, MD  Reason for Consult:  AKI HPI:  80M presenting with progressive AKI and concern for myeloma.  Pt with daughter in ED.  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  Lives in [place] with wife and does own IADLs.  Active in Bible study, gardening, family near by.  Had relatively normal GFR 12/2013 and has progressed over the past month as charted below.  Admitted 03/31/14 and renal w/u at that time with neg ANA, ANCA.  Renal US at that time with 12 and 13 cm kidneys in R/L, respectively and no obstruction.  SPEP had M-spike of 2.08 and sFLC with K;L ratio 0.06 and Lamda level of 47.4.  Pt with mild anemia, normal WBC and PLT.    Dr. Posey Pronto spoke with oncology yesterday and pt started on decadron last evening.  Developed dyspnea this AM and presented to ED for evaluation.    Pt has been relatively asymptomatic from this.  Has LUTS including hesitancy/urgency and nocturia.  No LEE.  No N/V.  Weight is down 12lb over past year.  No F /C   CREATININE, SER (mg/dL)  Date Value  04/18/2014 9.24*  04/02/2014 2.95*  04/01/2014 3.06*  03/31/2014 2.98*  ] ROS Balance of 12 systems is negative w/ exceptions as above  PMH  Past Medical History  Diagnosis Date  . COPD (chronic obstructive pulmonary disease)   . High cholesterol   . Dementia   . Depression   . Anxiety   . Stroke   . Reflux esophagitis    PSH History reviewed. No pertinent past surgical history. FH History reviewed. No pertinent family history. SH  reports that he has quit smoking. He does not have any smokeless tobacco history on file. He reports that he does not drink alcohol. His drug history is not on file. Allergies No Known Allergies Home medications Prior to Admission medications   Medication Sig Start Date End Date Taking? Authorizing Provider  albuterol (PROVENTIL HFA;VENTOLIN HFA) 108 (90 BASE) MCG/ACT inhaler Inhale 1-2 puffs into the lungs every  4 (four) hours as needed for wheezing or shortness of breath.    Historical Provider, MD  ALPRAZolam Duanne Moron) 0.25 MG tablet Take 0.25 mg by mouth daily as needed for anxiety.    Historical Provider, MD  amLODipine (NORVASC) 5 MG tablet Take 1 tablet (5 mg total) by mouth daily. 04/02/14   Domenic Polite, MD  aspirin EC 81 MG tablet Take 81 mg by mouth daily.    Historical Provider, MD  Cyanocobalamin (B-12 PO) Take by mouth.    Historical Provider, MD  donepezil (ARICEPT) 5 MG tablet Take 5 mg by mouth daily. 03/11/14   Historical Provider, MD  fluticasone (FLOVENT DISKUS) 50 MCG/BLIST diskus inhaler Inhale 1 puff into the lungs 2 (two) times daily.    Historical Provider, MD  mirtazapine (REMERON) 15 MG tablet Take 15 mg by mouth at bedtime.    Historical Provider, MD  Omega-3 Fatty Acids (FISH OIL) 1000 MG CAPS Take by mouth.    Historical Provider, MD  omeprazole (PRILOSEC) 20 MG capsule Take 20 mg by mouth daily. 03/11/14   Historical Provider, MD  pravastatin (PRAVACHOL) 80 MG tablet Take 80 mg by mouth daily. 03/11/14   Historical Provider, MD  VESICARE 5 MG tablet Take 5 mg by mouth daily. 01/10/14   Historical Provider, MD    Current Medications Scheduled Meds: Continuous Infusions: PRN Meds:.    CBC  Recent Labs Lab 04/18/14 1311  WBC 14.7*  NEUTROABS 13.4*  HGB 10.7*  HCT 31.7*  MCV 90.6  PLT 244   Basic Metabolic Panel  Recent Labs Lab 04/18/14 1311  NA 129*  K 5.5*  CL 87*  CO2 21  GLUCOSE 159*  BUN 75*  CREATININE 9.24*  CALCIUM 8.1*    Physical Exam  Blood pressure 161/71, pulse 78, temperature 98 F (36.7 C), temperature source Oral, resp. rate 20, SpO2 96 %. GEN: NAD, pleasant ENT: HOH, NCAT EYES: EOMI, arcus corenalis CV: RRR, no rub. Nl s1, s2 3/6 MSM at RUSB PULM: CTAB, nl crackles ABD: obese, nabs, s/nt/nd SKIN: no rashes/lesions WNU:UVOZD - 1+ LEE NEURO: nonfocal  Assessment/Plan 80M, independent and full IADLs, with progressive AKI,  +SPEP and [place] chain.  Concern for plasma cell related renal disorder.  1. AKI, suspect myeloma cast nephropathy vs other cause 1. Nearing RRT, no strong indication right now; assess daily 2. Renal biopsy to definitively define renal problem, guide therapy for TPE 3. Will ask for [facility] placement tomorrow with renal biopsy 4. Discussed short term and long term possibility for dialysis 5. Discussed ultimate treatment of myeloma will be chemotherapy 6. Will discuss role of TPE with oncology 7. Check C3 and C4, Coags, T&S 8. NPO p MN 9. Couched the entirety of these decisions in his goals at age 80 and likelihood of helping him continue to accomplish those 2. + SPEP, sFLC< concern for Myeloma 1. Started decadron 04/17/14 2. Oncology consulted 3. As above 3. Hx/o CVA 1. On ASA 81 for secondary prevention, hold for now 4. Hyperkalemia 1. Would not treat K < 6 2. Follow, no culprit medications 5. Hyponatremia, mild, asymptomatic 1. Restrict 1.5L fluids 2. Caused by fluids in excess of ability to excrete water   [name] MD [phone] pgr 04/18/2014, 4:10 PM

## 2014-04-18 NOTE — Plan of Care (Signed)
Problem: Consults Goal: Diabetes Guidelines if Diabetic/Glucose > 140 If diabetic or lab glucose is > 140 mg/dl - Initiate Diabetes/Hyperglycemia Guidelines & Document Interventions  Outcome: Not Applicable Date Met:  04/18/14     

## 2014-04-18 NOTE — ED Provider Notes (Signed)
  Face-to-face evaluation   History: He presents for evaluation of elevated creatinine and potassium.  Was discovered yesterday, at an appointment with his nephrologist.  His creatinine was 8 and his potassium was 6.9.  He was treated with oral Kayexalate.  Today on follow-up testing, his creatinine was still elevated.  Therefore, he was sent here for evaluation.  He has noticed gradually worse urinary output over the last several weeks. He was recently DC from hospital.  Physical exam:  Alert man in no apparent distress.  Heart regular in rhythm.  No murmur.  Lungs clear to auscultation.  Abdomen soft.  No palpable bladder mass.  He is mildly tender in the suprapubic region.  Medications  sodium polystyrene (KAYEXALATE) 15 GM/60ML suspension 30 g (not administered)    Patient Vitals for the past 24 hrs:  BP Temp Temp src Pulse Resp SpO2  04/18/14 1309 177/79 mmHg 98 F (36.7 C) Oral 80 18 95 %    EKG Interpretation  Date/Time:  Tuesday April 18 2014 14:44:22 EST Ventricular Rate:  80 PR Interval:  266 QRS Duration: 106 QT Interval:  387 QTC Calculation: 446 R Axis:   105 Text Interpretation:  Sinus rhythm Prolonged PR interval Right axis deviation Borderline T abnormalities, inferior leads Minimal ST elevation, lateral leads Baseline wander in lead(s) V5 V6 since last tracing no significant change Confirmed by Miguel Foster  MD, Miguel Compton ([id number]) on 04/18/2014 6:11:09 PM         2:53 PM Reevaluation with update and discussion. After initial assessment and treatment, an updated evaluation reveals Bladder Scan= 19 ml. Will contact his nephrologist and likely admit.Miguel Compton   Assessment- Acute Renal Failure with Hyperkalemia  Medical screening examination/treatment/procedure(s) were conducted as a shared visit with non-physician practitioner(s) and myself.  I personally evaluated the patient during the encounter  Miguel Blade, MD 04/19/14 1029

## 2014-04-19 ENCOUNTER — Inpatient Hospital Stay (HOSPITAL_COMMUNITY): Payer: Medicare Other

## 2014-04-19 DIAGNOSIS — D630 Anemia in neoplastic disease: Secondary | ICD-10-CM

## 2014-04-19 DIAGNOSIS — N189 Chronic kidney disease, unspecified: Secondary | ICD-10-CM

## 2014-04-19 DIAGNOSIS — E876 Hypokalemia: Secondary | ICD-10-CM

## 2014-04-19 DIAGNOSIS — D631 Anemia in chronic kidney disease: Secondary | ICD-10-CM

## 2014-04-19 LAB — COMPREHENSIVE METABOLIC PANEL
ALT: 12 U/L (ref 0–53)
AST: 21 U/L (ref 0–37)
Albumin: 2 g/dL — ABNORMAL LOW (ref 3.5–5.2)
Alkaline Phosphatase: 61 U/L (ref 39–117)
Anion gap: 21 — ABNORMAL HIGH (ref 5–15)
BUN: 85 mg/dL — ABNORMAL HIGH (ref 6–23)
CALCIUM: 7.4 mg/dL — AB (ref 8.4–10.5)
CO2: 22 meq/L (ref 19–32)
Chloride: 89 mEq/L — ABNORMAL LOW (ref 96–112)
Creatinine, Ser: 9.54 mg/dL — ABNORMAL HIGH (ref 0.50–1.35)
GFR calc Af Amer: 5 mL/min — ABNORMAL LOW (ref >90)
GFR calc non Af Amer: 4 mL/min — ABNORMAL LOW (ref >90)
Glucose, Bld: 116 mg/dL — ABNORMAL HIGH (ref 70–99)
Potassium: 4.4 mEq/L (ref 3.7–5.3)
Sodium: 132 mEq/L — ABNORMAL LOW (ref 137–147)
Total Bilirubin: 0.3 mg/dL (ref 0.3–1.2)
Total Protein: 7.1 g/dL (ref 6.0–8.3)

## 2014-04-19 LAB — CBC
HCT: 27.7 % — ABNORMAL LOW (ref 39.0–52.0)
Hemoglobin: 9.5 g/dL — ABNORMAL LOW (ref 13.0–17.0)
MCH: 30 pg (ref 26.0–34.0)
MCHC: 34.3 g/dL (ref 30.0–36.0)
MCV: 87.4 fL (ref 78.0–100.0)
PLATELETS: 279 10*3/uL (ref 150–400)
RBC: 3.17 MIL/uL — ABNORMAL LOW (ref 4.22–5.81)
RDW: 12.8 % (ref 11.5–15.5)
WBC: 15.9 10*3/uL — ABNORMAL HIGH (ref 4.0–10.5)

## 2014-04-19 LAB — PROTIME-INR
INR: 1.19 (ref 0.00–1.49)
Prothrombin Time: 15.2 seconds (ref 11.6–15.2)

## 2014-04-19 MED ORDER — CEFAZOLIN SODIUM-DEXTROSE 2-3 GM-% IV SOLR
2.0000 g | INTRAVENOUS | Status: AC
  Administered 2014-04-19: 2 g via INTRAVENOUS
  Filled 2014-04-19: qty 50

## 2014-04-19 MED ORDER — BENZONATATE 100 MG PO CAPS
100.0000 mg | ORAL_CAPSULE | Freq: Once | ORAL | Status: AC
  Administered 2014-04-19: 100 mg via ORAL
  Filled 2014-04-19: qty 1

## 2014-04-19 MED ORDER — MIDAZOLAM HCL 2 MG/2ML IJ SOLN
INTRAMUSCULAR | Status: AC | PRN
  Administered 2014-04-19 (×3): 1 mg via INTRAVENOUS

## 2014-04-19 MED ORDER — FENTANYL CITRATE 0.05 MG/ML IJ SOLN
INTRAMUSCULAR | Status: AC
  Filled 2014-04-19: qty 2

## 2014-04-19 MED ORDER — AMLODIPINE BESYLATE 10 MG PO TABS
10.0000 mg | ORAL_TABLET | Freq: Every day | ORAL | Status: DC
  Administered 2014-04-20 – 2014-04-23 (×4): 10 mg via ORAL
  Filled 2014-04-19 (×5): qty 1

## 2014-04-19 MED ORDER — MIDAZOLAM HCL 2 MG/2ML IJ SOLN
INTRAMUSCULAR | Status: AC
  Filled 2014-04-19: qty 2

## 2014-04-19 MED ORDER — LIDOCAINE HCL 1 % IJ SOLN
INTRAMUSCULAR | Status: AC
  Filled 2014-04-19: qty 20

## 2014-04-19 MED ORDER — HEPARIN SODIUM (PORCINE) 1000 UNIT/ML IJ SOLN
INTRAMUSCULAR | Status: AC
  Filled 2014-04-19: qty 1

## 2014-04-19 MED ORDER — CARVEDILOL 3.125 MG PO TABS
3.1250 mg | ORAL_TABLET | Freq: Two times a day (BID) | ORAL | Status: DC
  Administered 2014-04-19 – 2014-05-04 (×25): 3.125 mg via ORAL
  Filled 2014-04-19 (×35): qty 1

## 2014-04-19 MED ORDER — CEFAZOLIN SODIUM-DEXTROSE 2-3 GM-% IV SOLR
INTRAVENOUS | Status: AC
  Administered 2014-04-19: 2 g via INTRAVENOUS
  Filled 2014-04-19: qty 50

## 2014-04-19 MED ORDER — FENTANYL CITRATE 0.05 MG/ML IJ SOLN
INTRAMUSCULAR | Status: AC | PRN
  Administered 2014-04-19: 25 ug via INTRAVENOUS
  Administered 2014-04-19 (×2): 50 ug via INTRAVENOUS

## 2014-04-19 MED ORDER — HYDROCODONE-ACETAMINOPHEN 5-325 MG PO TABS
1.0000 | ORAL_TABLET | ORAL | Status: DC | PRN
  Administered 2014-04-22 – 2014-04-25 (×2): 2 via ORAL
  Administered 2014-04-26 – 2014-05-01 (×6): 1 via ORAL
  Filled 2014-04-19 (×2): qty 1
  Filled 2014-04-19: qty 2
  Filled 2014-04-19 (×3): qty 1
  Filled 2014-04-19: qty 2
  Filled 2014-04-19 (×2): qty 1

## 2014-04-19 NOTE — Progress Notes (Signed)
Showed patient Renal failure video #406 and #407.Patient to let RN know when he is ready to see the other videos.Patient's questions regarding hemodialysis answered by RN.Will continue to monitor. Heith Haigler, Wonda Cheng, Therapist, sports

## 2014-04-19 NOTE — Plan of Care (Signed)
Problem: Phase I Progression Outcomes Goal: Initial discharge plan identified Outcome: Completed/Met Date Met:  04/19/14

## 2014-04-19 NOTE — Plan of Care (Signed)
Problem: Phase I Progression Outcomes Goal: OOB as tolerated unless otherwise ordered Outcome: Completed/Met Date Met:  04/19/14

## 2014-04-19 NOTE — Progress Notes (Signed)
INITIAL NUTRITION ASSESSMENT  DOCUMENTATION CODES Per approved criteria  -Not Applicable   INTERVENTION: Once diet advances past clear liquids, provide Nepro Shake po BID, each supplement provides 425 kcal and 19 grams protein.  Encourage adequate PO intake.   NUTRITION DIAGNOSIS: Increased nutrient needs related to acute renal failure as evidenced by estimated nutrition needs.   Goal: Pt to meet >/= 90% of their estimated nutrition needs   Monitor:  Diet advancement, weight trends, labs, I/O's  Reason for Assessment: MST  80 y.o. male  Admitting Dx: Renal insufficiency  ASSESSMENT: Pt with a past medical history significant for COPD, hypertension, hyperlipidemia, mild dementia, history of CVA, presents to the emergency room as he was sent by his doctor for abnormal blood work.   12/9: R IJ HD cathter placement with [place] and fluoroscopy and renal bx  Pt with acute renal failure and will likely need dialysis per MD note. Pt reports having a great appetite currently. Meal completion is 90%. Pt reports he has been eating well at home with 3 full meals a day. Pt does report that he has had a decreased appetite PTA due to stress at home. Weight has been stable. Pt reports he is agreeable to Nepro Shake once diet advances past clear liquids. RD to order.  Labs: Low sodium, calcium, chloride, and GFR. High BUN and creatinine.   Height: Ht Readings from Last 1 Encounters:  02/28/08 5\' 10"  (1.778 m)    Weight: Wt Readings from Last 1 Encounters:  03/31/14 187 lb (84.823 kg)    Ideal Body Weight: 166 lbs  % Ideal Body Weight: 113%  Wt Readings from Last 10 Encounters:  03/31/14 187 lb (84.823 kg)  02/28/08 218 lb 2.1 oz (98.944 kg)  06/28/07 219 lb 6.1 oz (99.511 kg)    Usual Body Weight: 187 lbs  % Usual Body Weight: 100%  BMI:  Body Mass Index: 26.83 kg/(m^2)   Estimated Nutritional Needs: Kcal: 1850-2100 Protein: 90-110 grams Fluid: Per MD  Skin: Generalized  edema  Diet Order: Diet clear liquid  EDUCATION NEEDS: -No education needs identified at this time   Intake/Output Summary (Last 24 hours) at 04/19/14 1546 Last data filed at 04/19/14 1006  Gross per 24 hour  Intake    120 ml  Output    103 ml  Net     17 ml    Last BM: 12/8  Labs:   Recent Labs Lab 04/18/14 1311 04/19/14 0538  NA 129* 132*  K 5.5* 4.4  CL 87* 89*  CO2 21 22  BUN 75* 85*  CREATININE 9.24* 9.54*  CALCIUM 8.1* 7.4*  GLUCOSE 159* 116*    CBG (last 3)  No results for input(s): GLUCAP in the last 72 hours.  Scheduled Meds: . [START ON 04/20/2014] amLODipine  10 mg Oral Daily  . carvedilol  3.125 mg Oral BID WC  . dexamethasone  4 mg Oral Q12H  . fentaNYL      . fentaNYL      . fluticasone  1 puff Inhalation BID  . heparin      . lidocaine      . midazolam      . midazolam      . pantoprazole  40 mg Oral Daily  . pravastatin  80 mg Oral q1800  . sodium chloride  3 mL Intravenous Q12H    Continuous Infusions:   Past Medical History  Diagnosis Date  . High cholesterol   . Depression   .  GERD (gastroesophageal reflux disease)   . Hypertension   . Arthritis     "slight; in my hands" (04/18/2014)  . Anxiety   . Renal insufficiency   . Peripheral neuropathy     "fingers go to sleep"  . Dementia     "mild"  . Skin cancer of face     "I believe they burned them off"  . Prostate cancer     Past Surgical History  Procedure Laterality Date  . Tonsillectomy  1920's  . Cataract extraction, bilateral Bilateral 2000's  . Insertion prostate radiation seed  04/2001  . Cardiac catheterization  1985's    Kallie Locks, MS, RD, LDN Pager # [phone] After hours/ weekend pager # [phone]

## 2014-04-19 NOTE — Consult Note (Signed)
[place]  Telephone:(336) [place] CONSULTATION NOTE  TEJUAN GHOLSON                                MR#: [id number]  DOB: 01/05/1922                                CSN#: [id number]  Referring MD:  [facility]  Patient Care Team: Zorita Pang, MD as PCP - General (Family Medicine)  Reason for Consult: Multiple Myeloma   SEG:BTDVVO Miguel Compton is a 80 y.o.   male admitted on 04/18/2014 Through his primary physician for further evaluation of abnormal blood work, specifically hyperkalemia. He had been seen as an outpatient after hospitalization for acute renal failure. SPEP had been obtained during that hospitalization, which results were suspicious for multiple myeloma. Details of that SPEP are not available for review at this time.  Denies risk for Hepatitis or HIV. Patient denies history of recurrent infection or atypical infections such as shingles of meningitis. Patient denies any history or family history of bone marrow disorders. Never had a bone marrow biopsy in the past. Denies history of abnormal bone pain or bone fracture. The patient has been increasingly weak, has lost 30 pounds over the last 6 months unintentionally, and has had decreased concentration in the setting of chronic mild dementia. He denies any respiratory or cardiac complaints.  He had leukocytosis with a white count of 14.7 ANC of 13.4, hemoglobin of 10.7.Platelets were normal. In addition he had hyponatremia, with sodium of 129, and elevated creatinine at 9.4. His calcium was 8.1. His urine was positive for protein of 30.The patient has undergone a bone marrow biopsy today, 04/29/2014, which results are pending. Other studies include Metastatic bone survey which results are pending as well. Peripheral blood smear ordered for review.  He is also to undergo a renal biopsy IR.   We were requested to see this patient with recommendations.   PMH:  Past Medical History    Diagnosis Date  . High cholesterol   . Depression   . GERD (gastroesophageal reflux disease)   . Hypertension   . Arthritis     "slight; in my hands" (04/18/2014)  . Anxiety   . Renal insufficiency   . Peripheral neuropathy     "fingers go to sleep"  . Dementia     "mild"  . Skin cancer of face     "I believe they burned them off"  . Prostate cancer     Surgeries:  Past Surgical History  Procedure Laterality Date  . Tonsillectomy  1920's  . Cataract extraction, bilateral Bilateral 2000's  . Insertion prostate radiation seed  04/2001  . Cardiac catheterization  1985's    Allergies: No Known Allergies  Medications:   Scheduled Meds: . amLODipine  5 mg Oral Daily  .  ceFAZolin (ANCEF) IV  2 g Intravenous On Call  . dexamethasone  4 mg Oral Q12H  . fluticasone  1 puff Inhalation BID  . pantoprazole  40 mg Oral Daily  . pravastatin  80 mg Oral q1800  . sodium chloride  3 mL Intravenous Q12H   Continuous Infusions:  PRN Meds:.albuterol, ALPRAZolam   ROS: Constitutional: Denies fevers, chills or abnormal night sweats Eyes: Denies blurriness of vision, double vision or watery eyes Ears, nose,  mouth, throat, and face: Denies mucositis or sore throat Respiratory: Denies cough, dyspnea or wheezes Cardiovascular: Denies palpitations, chest discomfort or lower extremity swelling Gastrointestinal:  anorexia, nausea, vomiting, bowel hypomotility constipation,  Denies a history of pancreatitis or peptic ulcer disease. Endocrine : Denies polyuria, polydipsia or dehydration Skin: Denies abnormal skin rashes Lymphatics: Denies new lymphadenopathy or easy bruising Musculoskeletal: Denies profound muscle weakness, bone pain, history of osteopenia or  osteoporosis Neurological:Denies numbness or tingling.  Behavioral/Psych :Decreased concentration, confusion, fatigue, mood is stable without anxiety or depression.  All other systems were reviewed with the patient and are  negative.   Family History:    No family history of hematological  disorders.  Social History:  reports that he has quit smoking. His smoking use included Cigarettes. He smoked 0.00 packs per day for 12 years. He has never used smokeless tobacco. He reports that he does not drink alcohol or use illicit drugs. Married for 70 years. He has one son in good health. He is retired from Nature conservation officer work.  Physical Exam    ECOG PERFORMANCE STATUS: 2-3  Filed Vitals:   04/19/14 1005  BP: 155/61  Pulse: 68  Temp: 98.1 F (36.7 C)  Resp: 20   There were no vitals filed for this visit.  GENERAL:alert, no distress and comfortable SKIN: skin color, texture, turgor are normal, no rashes or significant lesions EYES: normal, conjunctiva are pink and non-injected, sclera clear OROPHARYNX: no exudate, no erythema and lips, buccal mucosa, and tongue normal  NECK: supple, thyroid normal size, non-tender, without nodularity LYMPH:  no palpable lymphadenopathy in the cervical, axillary or inguinal area LUNGS: clear to auscultation and percussion with normal breathing effort HEART: regular rate & rhythm and no murmurs and no lower extremity edema ABDOMEN:abdomen soft, non-tender and normal bowel sounds Musculoskeletal:no cyanosis of digits and no clubbing  PSYCH: alert & oriented x 3 with fluent speech NEURO: no focal motor/sensory deficits   Labs:   SPEP/UPEP: No results found for: SPEP, UPEP  @LASTLABBRIEF (IGA,IGG,IGM)@  CBC   Recent Labs Lab 04/18/14 1311 04/19/14 0538  WBC 14.7* 15.9*  HGB 10.7* 9.5*  HCT 31.7* 27.7*  PLT 291 279  MCV 90.6 87.4  MCH 30.6 30.0  MCHC 33.8 34.3  RDW 12.9 12.8  LYMPHSABS 1.0  --   MONOABS 0.3  --   EOSABS 0.0  --   BASOSABS 0.0  --      CMP    Recent Labs Lab 04/18/14 1311 04/19/14 0538  NA 129* 132*  K 5.5* 4.4  CL 87* 89*  CO2 21 22  GLUCOSE 159* 116*  BUN 75* 85*  CREATININE 9.24* 9.54*  CALCIUM 8.1* 7.4*  AST 22 21   ALT 13 12  ALKPHOS 72 61  BILITOT 0.3 0.3     Anemia panel:  No results for input(s): VITAMINB12, FOLATE, FERRITIN, TIBC, IRON, RETICCTPCT in the last 72 hours.   CBC   Recent Labs Lab 04/18/14 1311 04/19/14 0538  WBC 14.7* 15.9*  HGB 10.7* 9.5*  HCT 31.7* 27.7*  PLT 291 279  MCV 90.6 87.4  MCH 30.6 30.0  MCHC 33.8 34.3  RDW 12.9 12.8  LYMPHSABS 1.0  --   MONOABS 0.3  --   EOSABS 0.0  --   BASOSABS 0.0  --        Imaging Studies:  Dg Chest 2 View  04/18/2014   CLINICAL DATA:  ABNORMAL LAB, shortness of breath, COPD  EXAM: CHEST - 2 VIEW  COMPARISON:  08/25/2011  FINDINGS: Coarse bilateral interstitial opacities left greater than right, with relative sparing of the apices, increased since previous exam. Heart size is normal. No effusion. Visualized skeletal structures are unremarkable.  IMPRESSION: 1. Progressive coarse bilateral asymmetric interstitial opacities, probably largely chronic.   Electronically Signed   By: Arne Cleveland M.D.   On: 04/18/2014 15:36     A/P: 80 y.o. male admitted with    Multiple Myeloma Per chart report, the patient was admitted after abnormalities in his labs from his family physician have been noted. Apparently, SPEP was drawn at the facility, results suspicious for multiple myeloma. We will need to obtain these records as soon as possible, otherwise we will consider repeating these labs. In the interim, with a wait for metastatic bone survey results. We will check a peripheral blood smear to rule out rouleaux formation. We will await for results of the bone marrow biopsy, Just to be performed on 04/20/2014 Consider checking sedimentation rates, quantitative immunoglobulins, LDH, beta-2 microglobulin, serum light chains, and Epo levels unless already drawn. We'll follow results, and we will proceed with recommendations once these become available Monitor his calcium levels carefully  Malnutrition Due to underlying  malignancy Consider nutrition evaluation while in hospital  Anemia Due to chronic disease, renal failure, dilution and malignancy No bleeding issues are noted. Transfusion is not indicated at this time  Renal Failure Secondary to Hypertension, In the setting of possible multiple myeloma Appreciate Renal involvement He is to undergo a renal biopsy as per interventional radiology today, 12 /9  Full code  **Disclaimer: This note was dictated with voice recognition software. Similar sounding words can inadvertently be transcribed and this note may contain transcription errors which may not have been corrected upon publication of note.Sharene Butters E, PA-C 04/19/2014 10:19 AM    Attending Note  I personally saw the patient, reviewed the chart and examined the patient. The plan of care was discussed with the patient. I agree with the assessment and plan as documented above. Thank you very much for the consultation. Please see below for Assessment and Plan  1. ARF with 2 grams M-Protein, Lambda light chain restricted. Strongly suspicious for Myeloma. I discussed with the patient regarding the diagnosis and its pathogenesis and how it causes renal failure, lytic lesions and anemia. There is no evidence of hypercalcemia. Plan:  1. Bone marrow biopsy (if >10% is diagnostic for myeloma) 2. Bone survey 3. Beta 2 microglobulin (for prognosis determination)  2. Discussed treatment options. Instructed patient to decide between 3 options A. Dialysis with Velcade (sub Q injection weekly)-Dexamethasone (10 mg once a week) B. Hospice care C. Try Velcade-Dex alone without dialysis (which appears to be his preference ) But I explained to him that velcade may not work that emergently in saving the kidneys. We also discussed plasmapheresis as an option if he has IgM myeloma.  Will meet family on Friday at 2 PM to discuss further. Thanks

## 2014-04-19 NOTE — Consult Note (Signed)
Chief Complaint: Chief Complaint  Patient presents with  . Abnormal Lab  acute renal failure Work up suspicious for Multiple Myeloma  Referring Physician(s): Dr Joelyn Oms  History of Present Illness: Miguel Compton is a 80 y.o. male  Pt admitted with dyspnea; weakness Was admitted last month with similar sxs: noted renal insufficiency then Work up ongoing Renal function worsening  Possible MM Now scheduled for tunneled Hemodialysis catheter and renal biopsy per Dr Joelyn Oms request Dr Earleen Newport did discuss with Dr Joelyn Oms and agreeable to move forward Now scheduled for HD cath placement and random renal bx I have seen and examined pt  Past Medical History  Diagnosis Date  . High cholesterol   . Depression   . GERD (gastroesophageal reflux disease)   . Hypertension   . Arthritis     "slight; in my hands" (04/18/2014)  . Anxiety   . Renal insufficiency   . Peripheral neuropathy     "fingers go to sleep"  . Dementia     "mild"  . Skin cancer of face     "I believe they burned them off"  . Prostate cancer     Past Surgical History  Procedure Laterality Date  . Tonsillectomy  1920's  . Cataract extraction, bilateral Bilateral 2000's  . Insertion prostate radiation seed  04/2001  . Cardiac catheterization  1985's    Allergies: Review of patient's allergies indicates no known allergies.  Medications: Prior to Admission medications   Medication Sig Start Date End Date Taking? Authorizing Provider  albuterol (PROVENTIL HFA;VENTOLIN HFA) 108 (90 BASE) MCG/ACT inhaler Inhale 1-2 puffs into the lungs every 4 (four) hours as needed for wheezing or shortness of breath.   Yes Historical Provider, MD  ALPRAZolam Duanne Moron) 0.25 MG tablet Take 0.25 mg by mouth daily as needed for anxiety.   Yes Historical Provider, MD  aspirin EC 81 MG tablet Take 81 mg by mouth daily.   Yes Historical Provider, MD  cholecalciferol (VITAMIN D) 1000 UNITS tablet Take 1,000 Units by mouth daily.   Yes  Historical Provider, MD  Cyanocobalamin (B-12 PO) Take by mouth.   Yes Historical Provider, MD  dexamethasone (DECADRON) 4 MG tablet Take 8 mg by mouth 2 (two) times daily with a meal.   Yes Historical Provider, MD  donepezil (ARICEPT) 5 MG tablet Take 5 mg by mouth daily. 03/11/14  Yes Historical Provider, MD  fluticasone (FLOVENT DISKUS) 50 MCG/BLIST diskus inhaler Inhale 1 puff into the lungs 2 (two) times daily.   Yes Historical Provider, MD  HYDROcodone-acetaminophen (NORCO) 7.5-325 MG per tablet Take 1 tablet by mouth every 6 (six) hours as needed for moderate pain.   Yes Historical Provider, MD  mirtazapine (REMERON) 15 MG tablet Take 15 mg by mouth at bedtime as needed (sleep).    Yes Historical Provider, MD  Omega-3 Fatty Acids (FISH OIL) 1000 MG CAPS Take by mouth.   Yes Historical Provider, MD  omeprazole (PRILOSEC) 20 MG capsule Take 20 mg by mouth daily. 03/11/14  Yes Historical Provider, MD  pravastatin (PRAVACHOL) 80 MG tablet Take 80 mg by mouth daily. 03/11/14  Yes Historical Provider, MD  VESICARE 5 MG tablet Take 5 mg by mouth daily. 01/10/14  Yes Historical Provider, MD    History reviewed. No pertinent family history.  History   Social History  . Marital Status: Married    Spouse Name: N/A    Number of Children: N/A  . Years of Education: N/A   Social History Main  Topics  . Smoking status: Former Smoker -- 12 years    Types: Cigarettes  . Smokeless tobacco: Never Used     Comment: "quit smoking in the 1960's"  . Alcohol Use: No  . Drug Use: No  . Sexual Activity: None   Other Topics Concern  . None   Social History Narrative     Review of Systems: A 12 point ROS discussed and pertinent positives are indicated in the HPI above.  All other systems are negative.  Review of Systems  Constitutional: Positive for activity change and fatigue. Negative for unexpected weight change.  Respiratory: Positive for shortness of breath. Negative for apnea.     Cardiovascular: Negative for chest pain.  Gastrointestinal: Negative for abdominal pain.  Genitourinary: Positive for difficulty urinating.  Neurological: Positive for weakness. Negative for dizziness.  Psychiatric/Behavioral: Negative for behavioral problems and confusion.    Vital Signs: BP 150/53 mmHg  Pulse 66  Temp(Src) 97.8 F (36.6 C) (Oral)  Resp 17  SpO2 97%  Physical Exam  Constitutional: He is oriented to person, place, and time. He appears well-nourished.  Cardiovascular: Normal rate and regular rhythm.   Murmur heard. Pulmonary/Chest: Effort normal and breath sounds normal. He has no wheezes.  Abdominal: Soft. Bowel sounds are normal. There is no tenderness.  Musculoskeletal: Normal range of motion.  Neurological: He is alert and oriented to person, place, and time.  Skin: Skin is warm and dry.  Psychiatric: He has a normal mood and affect. His behavior is normal. Judgment and thought content normal.    Imaging: Dg Chest 2 View  04/18/2014   CLINICAL DATA:  ABNORMAL LAB, shortness of breath, COPD  EXAM: CHEST - 2 VIEW  COMPARISON:  08/25/2011  FINDINGS: Coarse bilateral interstitial opacities left greater than right, with relative sparing of the apices, increased since previous exam. Heart size is normal. No effusion. Visualized skeletal structures are unremarkable.  IMPRESSION: 1. Progressive coarse bilateral asymmetric interstitial opacities, probably largely chronic.   Electronically Signed   By: Arne Cleveland M.D.   On: 04/18/2014 15:36    Labs:  CBC:  Recent Labs  04/01/14 0525 04/02/14 0547 04/18/14 1311 04/19/14 0538  WBC 7.5 6.8 14.7* 15.9*  HGB 9.7* 9.9* 10.7* 9.5*  HCT 28.7* 30.2* 31.7* 27.7*  PLT 260 245 291 279    COAGS:  Recent Labs  04/01/14 0525 04/18/14 1730 04/19/14 0538  INR 1.11 1.17 1.19  APTT  --  31  --     BMP:  Recent Labs  04/01/14 0525 04/02/14 0547 04/18/14 1311 04/19/14 0538  NA 144 143 129* 132*  K 4.3  4.5 5.5* 4.4  CL 105 106 87* 89*  CO2 $Re'27 22 21 22  'bTi$ GLUCOSE 81 89 159* 116*  BUN 31* 32* 75* 85*  CALCIUM 8.8 8.6 8.1* 7.4*  CREATININE 3.06* 2.95* 9.24* 9.54*  GFRNONAA 17* 17* 4* 4*  GFRAA 19* 20* 5* 5*    LIVER FUNCTION TESTS:  Recent Labs  04/01/14 0525 04/18/14 1311 04/19/14 0538  BILITOT 0.3 0.3 0.3  AST $Re'19 22 21  'Utk$ ALT $R'10 13 12  'uV$ ALKPHOS 53 72 61  PROT 6.8 7.9 7.1  ALBUMIN 2.4* 2.2* 2.0*    TUMOR MARKERS: No results for input(s): AFPTM, CEA, CA199, CHROMGRNA in the last 8760 hours.  Assessment and Plan:  Generalized weakness; dyspnea Work up suspicious for MM Worsening renal functions Now scheduled for tunneled HD catheter and renal bx Pt and family aware of procedure benefits and  risks and agreeable to proceed Consent signed and in chart   Thank you for this interesting consult.  I greatly enjoyed meeting OBRIAN BULSON and look forward to participating in their care.    I spent a total of 20 minutes face to face in clinical consultation, greater than 50% of which was counseling/coordinating care for HD cath and renal bx  Signed: Trek Kimball A 04/19/2014, 9:19 AM

## 2014-04-19 NOTE — Plan of Care (Signed)
Problem: Phase I Progression Outcomes Goal: Dyspnea controlled at rest Outcome: Completed/Met Date Met:  04/19/14     

## 2014-04-19 NOTE — Procedures (Signed)
R IJ HD cathter placement with [place] and fluoroscopy [facility] renal 16g x3 core bx No complication No blood loss. See complete dictation in [facility].

## 2014-04-19 NOTE — Sedation Documentation (Signed)
Pt to remain on Bedrest for 3 hrs- may roll over on back. Dr. Posey Pronto in room to speak with patient.

## 2014-04-19 NOTE — Sedation Documentation (Signed)
Successful renal bx- bandaid to LL back area

## 2014-04-19 NOTE — Plan of Care (Signed)
Problem: Consults Goal: ARF/New Onset CRF Patient Education See Patient Education Module for education specifics.  Outcome: Progressing Showed patient video #[phone] and #[phone].

## 2014-04-19 NOTE — Progress Notes (Signed)
Admit: 04/18/2014 LOS: 1  62M, independent and full IADLs, with progressive AKI, +SPEP and Lambda Light chain. Concern for plasma cell related renal disorder.  Subjective:  Son-in-law at bedside, updated No new events Oncology to see today Hopeful for [facility] and Renal Bx today Pt w/o complaints   12/08 0701 - 12/09 0700 In: 120 [P.O.:120] Out: 103 [Urine:100; Stool:3]  There were no vitals filed for this visit.  Scheduled Meds: . amLODipine  5 mg Oral Daily  .  ceFAZolin (ANCEF) IV  2 g Intravenous On Call  . dexamethasone  4 mg Oral Q12H  . fluticasone  1 puff Inhalation BID  . pantoprazole  40 mg Oral Daily  . pravastatin  80 mg Oral q1800  . sodium chloride  3 mL Intravenous Q12H   Continuous Infusions:  PRN Meds:.albuterol, ALPRAZolam  Current Labs: reviewed    Physical Exam:  Blood pressure 155/61, pulse 68, temperature 98.1 F (36.7 C), temperature source Oral, resp. rate 20, SpO2 98 %. GEN: NAD, pleasant ENT: HOH, NCAT EYES: EOMI, arcus corenalis CV: RRR, no rub. Nl s1, s2 3/6 MSM at RUSB PULM: CTAB, nl crackles ABD: obese, nabs, s/nt/nd SKIN: no rashes/lesions VQQ:VZDGL - 1+ LEE NEURO: nonfocal  A/P 1. AKI, suspect myeloma cast nephropathy vs other cause 1. Nearing RRT, no strong indication right now; assess daily 2. Renal Bx and TDC today 3. Once again discussed short term and long term possibility for dialysis 4. Will discuss role of TPE with oncology 5. Complements pending 6. Framed the entirety of these decisions in his goals at age 80 and likelihood of helping him continue to accomplish those 2. + SPEP, sFLC; concern for Myeloma 1. Started decadron 04/17/14 2. Oncology consulted 3. As above 3. Hx/o CVA 1. On ASA 81 for secondary prevention, hold for now 4. Hyperkalemia 1. Would not treat K < 6 2. Follow, no culprit medications 5. Hyponatremia, mild, asymptomatic 1. Restrict 1.5L fluids 2. Caused by fluids in excess of ability to excrete  water 3. Some improvement today  [name] MD 04/19/2014, 10:19 AM   Recent Labs Lab 04/18/14 1311 04/19/14 0538  NA 129* 132*  K 5.5* 4.4  CL 87* 89*  CO2 21 22  GLUCOSE 159* 116*  BUN 75* 85*  CREATININE 9.24* 9.54*  CALCIUM 8.1* 7.4*    Recent Labs Lab 04/18/14 1311 04/19/14 0538  WBC 14.7* 15.9*  NEUTROABS 13.4*  --   HGB 10.7* 9.5*  HCT 31.7* 27.7*  MCV 90.6 87.4  PLT 291 279

## 2014-04-19 NOTE — Progress Notes (Signed)
Patient Demographics  Miguel Compton, is a 80 y.o. male, DOB - 1921/11/05, ZTI:[id number]  Admit date - 04/18/2014   Admitting Physician Costin Karlyne Greenspan, MD  Outpatient Primary MD for the patient is [name], MD  LOS - 1   Chief Complaint  Patient presents with  . Abnormal Lab        Subjective:   Miguel Compton today has, No headache, No chest pain, No abdominal pain - No Nausea, No new weakness tingling or numbness, No Cough - SOB.    Assessment & Plan    1. Acute renal failure. Suspicious for multiple myeloma related castor frothy. Renal following, will likely require temporal dialysis catheter placement and dialysis. Oncology called along with renal.   2. SPEP concerning for an diagnosed multiple myeloma. Currently on Decadron. Oncology has been consulted.   3. History of CVA. No acute issues.   4. Dyslipidemia. On statin continue.   5. Mild dementia. On Aricept.   6. Essential hypertension. On Norvasc, dose increased and Coreg added for better control.       Code Status: Full  Family Communication: Son-in-law  Disposition Plan: Likely home   Procedures due for renal biopsy, temporal dialysis catheter placement   Consults  renal, hematology, IR   Medications  Scheduled Meds: . [START ON 04/20/2014] amLODipine  10 mg Oral Daily  . carvedilol  3.125 mg Oral BID WC  .  ceFAZolin (ANCEF) IV  2 g Intravenous On Call  . dexamethasone  4 mg Oral Q12H  . fentaNYL      . fluticasone  1 puff Inhalation BID  . heparin      . lidocaine      . midazolam      . pantoprazole  40 mg Oral Daily  . pravastatin  80 mg Oral q1800  . sodium chloride  3 mL Intravenous Q12H   Continuous Infusions:  PRN Meds:.albuterol, ALPRAZolam  DVT Prophylaxis    Heparin   Lab Results    Component Value Date   PLT 279 04/19/2014    Antibiotics     Anti-infectives    Start     Dose/Rate Route Frequency Ordered Stop   04/19/14 0930  ceFAZolin (ANCEF) IVPB 2 g/50 mL premix    Comments:  Hang ON CALL to xray 12/9   2 g100 mL/hr over 30 Minutes Intravenous On call 04/19/14 0917 04/20/14 0930          Objective:   Filed Vitals:   04/18/14 2135 04/19/14 0513 04/19/14 1005 04/19/14 1105  BP: 174/84 150/53 155/61 180/68  Pulse: 75 66 68 72  Temp: 97.8 F (36.6 C) 97.8 F (36.6 C) 98.1 F (36.7 C)   TempSrc: Oral Oral Oral   Resp: $Remo'18 17 20 16  'bzTao$ SpO2: 99% 97% 98% 99%    Wt Readings from Last 3 Encounters:  03/31/14 84.823 kg (187 lb)  02/28/08 98.944 kg (218 lb 2.1 oz)  06/28/07 99.511 kg (219 lb 6.1 oz)     Intake/Output Summary (Last 24 hours) at 04/19/14 1122 Last data filed at 04/19/14 1006  Gross per 24 hour  Intake    120 ml  Output    103 ml  Net     17  ml     Physical Exam  Awake Alert, Oriented X 3, No new F.N deficits, Normal affect [place].AT,PERRAL Supple Neck,No JVD, No cervical lymphadenopathy appriciated.  Symmetrical Chest wall movement, Good air movement bilaterally, CTAB RRR,No Gallops,Rubs or new Murmurs, No Parasternal Heave +ve B.Sounds, Abd Soft, No tenderness, No organomegaly appriciated, No rebound - guarding or rigidity. No Cyanosis, Clubbing or edema, No new Rash or bruise      Data Review   Micro Results No results found for this or any previous visit (from the past 240 hour(s)).  Radiology Reports Dg Chest 2 View  04/18/2014   CLINICAL DATA:  ABNORMAL LAB, shortness of breath, COPD  EXAM: CHEST - 2 VIEW  COMPARISON:  08/25/2011  FINDINGS: Coarse bilateral interstitial opacities left greater than right, with relative sparing of the apices, increased since previous exam. Heart size is normal. No effusion. Visualized skeletal structures are unremarkable.  IMPRESSION: 1. Progressive coarse bilateral asymmetric interstitial  opacities, probably largely chronic.   Electronically Signed   By: Arne Cleveland M.D.   On: 04/18/2014 15:36     CBC  Recent Labs Lab 04/18/14 1311 04/19/14 0538  WBC 14.7* 15.9*  HGB 10.7* 9.5*  HCT 31.7* 27.7*  PLT 291 279  MCV 90.6 87.4  MCH 30.6 30.0  MCHC 33.8 34.3  RDW 12.9 12.8  LYMPHSABS 1.0  --   MONOABS 0.3  --   EOSABS 0.0  --   BASOSABS 0.0  --     Chemistries   Recent Labs Lab 04/18/14 1311 04/19/14 0538  NA 129* 132*  K 5.5* 4.4  CL 87* 89*  CO2 21 22  GLUCOSE 159* 116*  BUN 75* 85*  CREATININE 9.24* 9.54*  CALCIUM 8.1* 7.4*  AST 22 21  ALT 13 12  ALKPHOS 72 61  BILITOT 0.3 0.3   ------------------------------------------------------------------------------------------------------------------ CrCl cannot be calculated (Unknown ideal weight.). ------------------------------------------------------------------------------------------------------------------ No results for input(s): HGBA1C in the last 72 hours. ------------------------------------------------------------------------------------------------------------------ No results for input(s): CHOL, HDL, LDLCALC, TRIG, CHOLHDL, LDLDIRECT in the last 72 hours. ------------------------------------------------------------------------------------------------------------------ No results for input(s): TSH, T4TOTAL, T3FREE, THYROIDAB in the last 72 hours.  Invalid input(s): FREET3 ------------------------------------------------------------------------------------------------------------------ No results for input(s): VITAMINB12, FOLATE, FERRITIN, TIBC, IRON, RETICCTPCT in the last 72 hours.  Coagulation profile  Recent Labs Lab 04/18/14 1730 04/19/14 0538  INR 1.17 1.19    No results for input(s): DDIMER in the last 72 hours.  Cardiac Enzymes No results for input(s): CKMB, TROPONINI, MYOGLOBIN in the last 168 hours.  Invalid input(s):  CK ------------------------------------------------------------------------------------------------------------------ Invalid input(s): POCBNP     Time Spent in minutes   35   Nam Vossler K M.D on 04/19/2014 at 11:22 AM  Between 7am to 7pm - Pager - [phone]  After 7pm go to [email or web address] - password [email or web address]  And look for the night coverage person covering for me after hours  [facility] Office  [phone]

## 2014-04-19 NOTE — Sedation Documentation (Signed)
Successful HD cath placed- will turn pt over on stomach- to proceed with Renal bx

## 2014-04-20 ENCOUNTER — Inpatient Hospital Stay (HOSPITAL_COMMUNITY): Payer: Medicare Other

## 2014-04-20 DIAGNOSIS — C9000 Multiple myeloma not having achieved remission: Secondary | ICD-10-CM | POA: Insufficient documentation

## 2014-04-20 LAB — RENAL FUNCTION PANEL
Albumin: 1.9 g/dL — ABNORMAL LOW (ref 3.5–5.2)
Anion gap: 23 — ABNORMAL HIGH (ref 5–15)
BUN: 100 mg/dL — AB (ref 6–23)
CHLORIDE: 89 meq/L — AB (ref 96–112)
CO2: 18 mEq/L — ABNORMAL LOW (ref 19–32)
Calcium: 6.8 mg/dL — ABNORMAL LOW (ref 8.4–10.5)
Creatinine, Ser: 9.93 mg/dL — ABNORMAL HIGH (ref 0.50–1.35)
GFR calc non Af Amer: 4 mL/min — ABNORMAL LOW (ref >90)
GFR, EST AFRICAN AMERICAN: 5 mL/min — AB (ref >90)
Glucose, Bld: 157 mg/dL — ABNORMAL HIGH (ref 70–99)
POTASSIUM: 4.2 meq/L (ref 3.7–5.3)
Phosphorus: 8.5 mg/dL — ABNORMAL HIGH (ref 2.3–4.6)
SODIUM: 130 meq/L — AB (ref 137–147)

## 2014-04-20 LAB — CBC
HCT: 28.2 % — ABNORMAL LOW (ref 39.0–52.0)
Hemoglobin: 9.9 g/dL — ABNORMAL LOW (ref 13.0–17.0)
MCH: 31.1 pg (ref 26.0–34.0)
MCHC: 35.1 g/dL (ref 30.0–36.0)
MCV: 88.7 fL (ref 78.0–100.0)
PLATELETS: 300 10*3/uL (ref 150–400)
RBC: 3.18 MIL/uL — ABNORMAL LOW (ref 4.22–5.81)
RDW: 12.9 % (ref 11.5–15.5)
WBC: 16.2 10*3/uL — AB (ref 4.0–10.5)

## 2014-04-20 LAB — C3 COMPLEMENT: C3 Complement: 98 mg/dL (ref 90–180)

## 2014-04-20 LAB — IGG, IGA, IGM
IGG (IMMUNOGLOBIN G), SERUM: 2400 mg/dL — AB (ref 650–1600)
IgA: 75 mg/dL (ref 68–379)
IgM, Serum: 49 mg/dL — ABNORMAL LOW (ref 41–251)

## 2014-04-20 LAB — C4 COMPLEMENT: Complement C4, Body Fluid: 20 mg/dL (ref 10–40)

## 2014-04-20 MED ORDER — BISACODYL 10 MG RE SUPP
10.0000 mg | Freq: Every day | RECTAL | Status: DC
  Administered 2014-04-21 – 2014-04-23 (×2): 10 mg via RECTAL
  Filled 2014-04-20 (×3): qty 1

## 2014-04-20 MED ORDER — DOCUSATE SODIUM 100 MG PO CAPS
200.0000 mg | ORAL_CAPSULE | Freq: Two times a day (BID) | ORAL | Status: DC
  Administered 2014-04-20 – 2014-04-24 (×8): 200 mg via ORAL
  Filled 2014-04-20 (×10): qty 2

## 2014-04-20 NOTE — Evaluation (Signed)
Physical Therapy Evaluation Patient Details Name: Miguel Compton MRN: [id number] DOB: 08-28-1921 Today's Date: 04/20/2014   History of Present Illness  Pt is a 80 y.o. male with Past medical history of COPD, dementia, depression, CVA, overflow incontinence. The patient is presenting with complaints of an abnormal lab. He mentions that since last 2 months he has been having generalized malaise, leg swelling, and poor appetite with weight loss as throughout the year he has been busy taking care of his wife who has been hospitalized multiple times. He has noticed he has some right-sided flank pain ongoing since last one week. He also mentions that he has occasional episodes of constipation followed by episodes of bowel movements which are consistent only mucousy output for last a year or so.  Clinical Impression  Pt admitted with the above. Pt currently with functional limitations due to the deficits listed below (see PT Problem List). At the time of PT eval pt was able to perform transfers and ambulation with assist for balance initially, however no further assistance was required. Pt ambulated ~300 feet with no AD and no unsteadiness noted. Pt states he is close to his baseline.  Pt will benefit from skilled PT to increase their independence and safety with mobility to allow discharge to the venue listed below. Will follow acutely, however do not feel that pt will require PT follow-up.      Follow Up Recommendations No PT follow up;Supervision - Intermittent    Equipment Recommendations  None recommended by PT    Recommendations for Other Services       Precautions / Restrictions Precautions Precautions: Fall Restrictions Weight Bearing Restrictions: No      Mobility  Bed Mobility Overal bed mobility: Needs Assistance Bed Mobility: Supine to Sit     Supine to sit: Supervision     General bed mobility comments: Pt was able to transition to EOB with no physical assistance.    Transfers Overall transfer level: Needs assistance Equipment used: Rolling walker (2 wheeled) Transfers: Sit to/from Stand Sit to Stand: Min assist         General transfer comment: Pt required assist for balance uipon first stand. Upon second stand pt was able to balance himself and therapist provided min guard assist.   Ambulation/Gait Ambulation/Gait assistance: Min guard Ambulation Distance (Feet): 415 Feet Assistive device: Rolling walker (2 wheeled);None Gait Pattern/deviations: Step-through pattern;Decreased stride length;Trunk flexed Gait velocity: Decreased Gait velocity interpretation: Below normal speed for age/gender General Gait Details: Pt was able to ambulate well with the RW, however after he felt he had gained his balance pt was able to ambulate without an AD. Pt states he is close to his baseline without the walker.   Stairs            Wheelchair Mobility    Modified Rankin (Stroke Patients Only)       Balance Overall balance assessment: Needs assistance Sitting-balance support: Feet supported;No upper extremity supported Sitting balance-Leahy Scale: Fair     Standing balance support: No upper extremity supported;During functional activity Standing balance-Leahy Scale: Fair Standing balance comment: Assist initially to maintain standing balance however did not require any assist after                             Pertinent Vitals/Pain Pain Assessment: Faces Faces Pain Scale: Hurts a little bit Pain Location: Abdomen. Feels that he is hungry and this is his pain.  Pain Intervention(s):  Limited activity within patient's tolerance;Monitored during session    [place] expects to be discharged to:: Private residence Living Arrangements: Spouse/significant other Available Help at Discharge: Family;Available 24 hours/day Type of Home: House Home Access: Stairs to enter Entrance Stairs-Rails: Right;Left;Can reach  both Entrance Stairs-Number of Steps: 3 Home Layout: One level Home Equipment: Walker - 2 wheels;Cane - single point      Prior Function Level of Independence: Independent         Comments: Still driving, does grocery shopping independently or with wife     Hand Dominance   Dominant Hand: Right    Extremity/Trunk Assessment   Upper Extremity Assessment: Defer to OT evaluation           Lower Extremity Assessment: Generalized weakness      Cervical / Trunk Assessment: Kyphotic  Communication   Communication: No difficulties  Cognition Arousal/Alertness: Awake/alert Behavior During Therapy: WFL for tasks assessed/performed Overall Cognitive Status: Within Functional Limits for tasks assessed                      General Comments      Exercises        Assessment/Plan    PT Assessment Patient needs continued PT services  PT Diagnosis Difficulty walking;Generalized weakness   PT Problem List Decreased strength;Decreased range of motion;Decreased activity tolerance;Decreased balance;Decreased mobility;Decreased knowledge of use of DME;Decreased safety awareness;Decreased knowledge of precautions;Cardiopulmonary status limiting activity  PT Treatment Interventions DME instruction;Gait training;Stair training;Functional mobility training;Therapeutic activities;Therapeutic exercise;Neuromuscular re-education;Patient/family education   PT Goals (Current goals can be found in the Care Plan section) Acute Rehab PT Goals Patient Stated Goal: Return home to his wife of 66 years PT Goal Formulation: With patient Time For Goal Achievement: 04/27/14 Potential to Achieve Goals: Good    Frequency Min 3X/week   Barriers to discharge        Co-evaluation               End of Session Equipment Utilized During Treatment: Gait belt Activity Tolerance: Patient tolerated treatment well Patient left: in chair;with chair alarm set;with call bell/phone within  reach Nurse Communication: Mobility status         Time: 1430-1502 PT Time Calculation (min) (ACUTE ONLY): 32 min   Charges:   PT Evaluation $Initial PT Evaluation Tier I: 1 Procedure PT Treatments $Gait Training: 8-22 mins $Therapeutic Activity: 8-22 mins   PT G Codes:          Rolinda Roan 04/20/2014, 3:16 PM   Rolinda Roan, PT, DPT Acute Rehabilitation Services Pager: [phone]

## 2014-04-20 NOTE — Progress Notes (Signed)
Admit: 04/18/2014 LOS: 2  80M, independent and full IADLs, with progressive AKI, +SPEP and [employer]. Concern for plasma cell related renal disorder.  Subjective:  Son-in-law at bedside and wife, updated Renal biopsy and TDC yesterday Seen by oncology today; for BM Biopsy today Pt w/o complaints No N/V, fatigue Skeletal survey without lytic/blastic lesion  12/09 0701 - 12/10 0700 In: 720 [P.O.:720] Out: -   There were no vitals filed for this visit.  Scheduled Meds: . amLODipine  10 mg Oral Daily  . bisacodyl  10 mg Rectal Daily  . carvedilol  3.125 mg Oral BID WC  . dexamethasone  4 mg Oral Q12H  . docusate sodium  200 mg Oral BID  . fluticasone  1 puff Inhalation BID  . pantoprazole  40 mg Oral Daily  . pravastatin  80 mg Oral q1800  . sodium chloride  3 mL Intravenous Q12H   Continuous Infusions:  PRN Meds:.albuterol, ALPRAZolam, HYDROcodone-acetaminophen  Current Labs: reviewed    Physical Exam:  Blood pressure 140/68, pulse 64, temperature 97.8 F (36.6 C), temperature source Oral, resp. rate 17, SpO2 97 %. GEN: NAD, pleasant ENT: HOH, NCAT EYES: EOMI, arcus corenalis CV: RRR, no rub. Nl s1, s2 3/6 MSM at RUSB PULM: CTAB, nl crackles ABD: obese, nabs, s/nt/nd SKIN: no rashes/lesions XKG:YJEHU - 1+ LEE NEURO: nonfocal  A/P 1. AKI, suspect myeloma cast nephropathy vs other cause 1. Nearing RRT, no strong indication right now; labs today pending 2. Renal Bx and TDC 04/19/14; perhaps prelim results 04/21/14 3. Once again discussed short term and long term possibility for dialysis 4. I think if pt with cast nephropathy, reasonable to do trial of TPE 1. Would like to know extent of fibrosis / scarring on biopsy 2. Did have relatively normal GFR a few months ago 5. Complements negative 6. Framed the entirety of these decisions in his goals at age 80 and likelihood of helping him continue to accomplish those 2. + SPEP, sFLC; concern for  Myeloma 1. Started decadron 04/17/14 2. Oncology consulted 3. As above 3. Hx/o CVA 1. On ASA 81 for secondary prevention, held for renal biopsy 4. Hyperkalemia, resolved 1. Would not treat K < 6 2. Follow, no culprit medications 5. Hyponatremia, mild, asymptomatic 1. Restrict 1.5L fluids 2. Caused by fluids in excess of ability to excrete water 3. Some improvement today  [name] MD 04/20/2014, 10:50 AM   Recent Labs Lab 04/18/14 1311 04/19/14 0538  NA 129* 132*  K 5.5* 4.4  CL 87* 89*  CO2 21 22  GLUCOSE 159* 116*  BUN 75* 85*  CREATININE 9.24* 9.54*  CALCIUM 8.1* 7.4*    Recent Labs Lab 04/18/14 1311 04/19/14 0538  WBC 14.7* 15.9*  NEUTROABS 13.4*  --   HGB 10.7* 9.5*  HCT 31.7* 27.7*  MCV 90.6 87.4  PLT 291 279

## 2014-04-20 NOTE — Consult Note (Signed)
Chief Complaint: Chief Complaint  Patient presents with  . Abnormal Lab  prob Multiple Myeloma  Referring Physician(s): TRH  History of Present Illness: Miguel Compton is a 80 y.o. male   Pt with ARF/nephropathy Work up shows probable MM Now scheduled for Bone marrow bx per [facility] request I have seen and examined pt  Past Medical History  Diagnosis Date  . High cholesterol   . Depression   . GERD (gastroesophageal reflux disease)   . Hypertension   . Arthritis     "slight; in my hands" (04/18/2014)  . Anxiety   . Renal insufficiency   . Peripheral neuropathy     "fingers go to sleep"  . Dementia     "mild"  . Skin cancer of face     "I believe they burned them off"  . Prostate cancer     Past Surgical History  Procedure Laterality Date  . Tonsillectomy  1920's  . Cataract extraction, bilateral Bilateral 2000's  . Insertion prostate radiation seed  04/2001  . Cardiac catheterization  1985's    Allergies: Review of patient's allergies indicates no known allergies.  Medications: Prior to Admission medications   Medication Sig Start Date End Date Taking? Authorizing Provider  albuterol (PROVENTIL HFA;VENTOLIN HFA) 108 (90 BASE) MCG/ACT inhaler Inhale 1-2 puffs into the lungs every 4 (four) hours as needed for wheezing or shortness of breath.   Yes Historical Provider, MD  ALPRAZolam Duanne Moron) 0.25 MG tablet Take 0.25 mg by mouth daily as needed for anxiety.   Yes Historical Provider, MD  aspirin EC 81 MG tablet Take 81 mg by mouth daily.   Yes Historical Provider, MD  cholecalciferol (VITAMIN D) 1000 UNITS tablet Take 1,000 Units by mouth daily.   Yes Historical Provider, MD  Cyanocobalamin (B-12 PO) Take by mouth.   Yes Historical Provider, MD  dexamethasone (DECADRON) 4 MG tablet Take 8 mg by mouth 2 (two) times daily with a meal.   Yes Historical Provider, MD  donepezil (ARICEPT) 5 MG tablet Take 5 mg by mouth daily. 03/11/14  Yes Historical Provider, MD    fluticasone (FLOVENT DISKUS) 50 MCG/BLIST diskus inhaler Inhale 1 puff into the lungs 2 (two) times daily.   Yes Historical Provider, MD  HYDROcodone-acetaminophen (NORCO) 7.5-325 MG per tablet Take 1 tablet by mouth every 6 (six) hours as needed for moderate pain.   Yes Historical Provider, MD  mirtazapine (REMERON) 15 MG tablet Take 15 mg by mouth at bedtime as needed (sleep).    Yes Historical Provider, MD  Omega-3 Fatty Acids (FISH OIL) 1000 MG CAPS Take by mouth.   Yes Historical Provider, MD  omeprazole (PRILOSEC) 20 MG capsule Take 20 mg by mouth daily. 03/11/14  Yes Historical Provider, MD  pravastatin (PRAVACHOL) 80 MG tablet Take 80 mg by mouth daily. 03/11/14  Yes Historical Provider, MD  VESICARE 5 MG tablet Take 5 mg by mouth daily. 01/10/14  Yes Historical Provider, MD    History reviewed. No pertinent family history.  History   Social History  . Marital Status: Married    Spouse Name: N/A    Number of Children: N/A  . Years of Education: N/A   Social History Main Topics  . Smoking status: Former Smoker -- 12 years    Types: Cigarettes  . Smokeless tobacco: Never Used     Comment: "quit smoking in the 1960's"  . Alcohol Use: No  . Drug Use: No  . Sexual Activity: None  Other Topics Concern  . None   Social History Narrative    Review of Systems: A 12 point ROS discussed and pertinent positives are indicated in the HPI above.  All other systems are negative.  Review of Systems  Constitutional: Positive for activity change. Negative for fever and unexpected weight change.  Respiratory: Positive for cough. Negative for shortness of breath.   Neurological: Positive for weakness.  Psychiatric/Behavioral: Negative for behavioral problems.    Vital Signs: BP 140/68 mmHg  Pulse 64  Temp(Src) 97.8 F (36.6 C) (Oral)  Resp 17  SpO2 97%  Physical Exam  Constitutional: He is oriented to person, place, and time.  Cardiovascular: Normal rate and regular rhythm.    Pulmonary/Chest: Effort normal and breath sounds normal. He has no wheezes.  Abdominal: Soft. Bowel sounds are normal.  Musculoskeletal: Normal range of motion.  Neurological: He is alert and oriented to person, place, and time.  Skin: Skin is warm and dry.  Psychiatric: He has a normal mood and affect. His behavior is normal. Thought content normal.  Nursing note and vitals reviewed.   Imaging: Dg Chest 2 View  04/18/2014   CLINICAL DATA:  ABNORMAL LAB, shortness of breath, COPD  EXAM: CHEST - 2 VIEW  COMPARISON:  08/25/2011  FINDINGS: Coarse bilateral interstitial opacities left greater than right, with relative sparing of the apices, increased since previous exam. Heart size is normal. No effusion. Visualized skeletal structures are unremarkable.  IMPRESSION: 1. Progressive coarse bilateral asymmetric interstitial opacities, probably largely chronic.   Electronically Signed   By: Oley Balm M.D.   On: 04/18/2014 15:36   Ir Fluoro Guide Cv Line Right  04/19/2014   CLINICAL DATA:  Acute renal insufficiency, needs access for hemodialysis  EXAM: TUNNELED HEMODIALYSIS CATHETER PLACEMENT WITH ULTRASOUND AND FLUOROSCOPIC GUIDANCE  TECHNIQUE: The procedure, risks, benefits, and alternatives were explained to the patient. Questions regarding the procedure were encouraged and answered. The patient understands and consents to the procedure. As antibiotic prophylaxis, cefazolin 2 g was ordered pre-procedure and administered intravenously within one hour of incision.Patency of the right IJ vein was confirmed with ultrasound with image documentation. An appropriate skin site was determined. Region was prepped using maximum barrier technique including cap and mask, sterile gown, sterile gloves, large sterile sheet, and Chlorhexidine as cutaneous antisepsis. The region was infiltrated locally with 1% lidocaine.  Intravenous Fentanyl and Versed were administered as conscious sedation during continuous  cardiorespiratory monitoring by the radiology RN, with a total moderate sedation time of 25 minutes.  Under real-time ultrasound guidance, the right IJ vein was accessed with a 21 gauge micropuncture needle; the needle tip within the vein was confirmed with ultrasound image documentation. Needle exchanged over the 018 guidewire for transitional dilator, which allowed advancement of a Benson wire into the IVC. Over this, an MPA catheter was advanced. A Hemosplit 19 hemodialysis catheter was tunneled from the right anterior chest wall approach to the right IJ dermatotomy site. The MPA catheter was exchanged over an Amplatz wire for serial vascular dilators which allow placement of a peel-away sheath, through which the catheter was advanced under intermittent fluoroscopy, positioned with its tips in the proximal and midright atrium. Spot chest radiograph confirms good catheter position. No pneumothorax. Catheter was flushed and primed per protocol. Catheter secured externally with O Prolene sutures. The right IJ dermatotomy site was closed with Dermabond.  COMPLICATIONS: COMPLICATIONS None immediate  FLUOROSCOPY TIME:  48 seconds  COMPARISON:  None  IMPRESSION: 1. Technically successful placement  of tunneled right IJ hemodialysis catheter with ultrasound and fluoroscopic guidance. Ready for routine use.  ACCESS: Remains approachable for percutaneous intervention as needed.   Electronically Signed   By: Arne Cleveland M.D.   On: 04/19/2014 12:10   Ir US Guide Vasc Access Right  04/19/2014   CLINICAL DATA:  Acute renal insufficiency, needs access for hemodialysis  EXAM: TUNNELED HEMODIALYSIS CATHETER PLACEMENT WITH ULTRASOUND AND FLUOROSCOPIC GUIDANCE  TECHNIQUE: The procedure, risks, benefits, and alternatives were explained to the patient. Questions regarding the procedure were encouraged and answered. The patient understands and consents to the procedure. As antibiotic prophylaxis, cefazolin 2 g was ordered  pre-procedure and administered intravenously within one hour of incision.Patency of the right IJ vein was confirmed with ultrasound with image documentation. An appropriate skin site was determined. Region was prepped using maximum barrier technique including cap and mask, sterile gown, sterile gloves, large sterile sheet, and Chlorhexidine as cutaneous antisepsis. The region was infiltrated locally with 1% lidocaine.  Intravenous Fentanyl and Versed were administered as conscious sedation during continuous cardiorespiratory monitoring by the radiology RN, with a total moderate sedation time of 25 minutes.  Under real-time ultrasound guidance, the right IJ vein was accessed with a 21 gauge micropuncture needle; the needle tip within the vein was confirmed with ultrasound image documentation. Needle exchanged over the 018 guidewire for transitional dilator, which allowed advancement of a Benson wire into the IVC. Over this, an MPA catheter was advanced. A Hemosplit 19 hemodialysis catheter was tunneled from the right anterior chest wall approach to the right IJ dermatotomy site. The MPA catheter was exchanged over an Amplatz wire for serial vascular dilators which allow placement of a peel-away sheath, through which the catheter was advanced under intermittent fluoroscopy, positioned with its tips in the proximal and midright atrium. Spot chest radiograph confirms good catheter position. No pneumothorax. Catheter was flushed and primed per protocol. Catheter secured externally with O Prolene sutures. The right IJ dermatotomy site was closed with Dermabond.  COMPLICATIONS: COMPLICATIONS None immediate  FLUOROSCOPY TIME:  48 seconds  COMPARISON:  None  IMPRESSION: 1. Technically successful placement of tunneled right IJ hemodialysis catheter with ultrasound and fluoroscopic guidance. Ready for routine use.  ACCESS: Remains approachable for percutaneous intervention as needed.   Electronically Signed   By: Arne Cleveland M.D.   On: 04/19/2014 12:10   Ir US Guide Bx Asp/drain  04/19/2014   CLINICAL DATA:  Acute renal insufficiency  COMPARISON:  CT 03/21/2008  TECHNIQUE: ULTRASOUND GUIDED RENAL CORE BIOPSY  Survey ultrasound was performed and an appropriate skin entry site was localized. Site was marked, prepped with Betadine, draped in usual sterile fashion, infiltrated locally with 1% lidocaine. Intravenous Fentanyl and Versed were administered as conscious sedation during continuous cardiorespiratory monitoring by the radiology RN with a total moderate sedation time of 25 minutes.  Under real time ultrasound guidance, a 15 gauge trocar needle was advanced to the margin of the lower pole left kidney. The 16 gauge core needle was coaxially advanced for 3 passes. The core samples were submitted to pathology. The patient tolerated procedure well.  COMPLICATIONS: COMPLICATIONS none  IMPRESSION: 1. Technically successful ultrasound-guided core renal biopsy , lower pole left kidney.   Electronically Signed   By: Arne Cleveland M.D.   On: 04/19/2014 12:12   Dg Bone Survey Met  04/20/2014   CLINICAL DATA:  80 year old male with multiple myeloma.  EXAM: METASTATIC BONE SURVEY  COMPARISON:  Recent prior osseous survey 04/13/2014  FINDINGS:  Chest: No discrete lytic lesion in the visualized bones. Right IJ approach tunneled hemodialysis/pheresis catheter. The catheter tips project over the mid SVC. Mild cardiomegaly. Moderately large hiatal hernia. Atherosclerotic calcification noted in the transverse aorta. Stable appearance of left lingular opacity, background bronchitic changes and interstitial prominence. No new acute cardiopulmonary finding.  Spine: Limited visualization of the cervical spine beyond the level of C6 secondary to overlap of the shoulder girdles. Multilevel cervical spondylosis. Right worse than left facet arthropathy. No discrete lytic osseous lesion. Atherosclerotic calcification present in the region of the  left carotid bifurcation. Multilevel degenerative spurring throughout the thoracic spine. No evidence of thoracic compression fracture. Degenerative spurring continues to the lumbar spine. There is moderate facet arthropathy at L4-L5 and L5-S1. No discrete lytic lesion.  Skull: No discrete osseous lesion. Normal aeration of the paranasal sinuses.  Upper extremities: No discrete lytic osseous lesion. No acute fracture or malalignment. Nonunion of remote healed fracture involving the right ulnar styloid.  Pelvis: No discrete lytic osseous lesion. The bones are mildly osteopenic. Brachy therapy seeds project over the prostate gland. Mild bilateral hip joint osteoarthritis.  Lower extremities: No discrete lytic lesion. Mild scattered atherosclerotic vascular calcifications.  IMPRESSION: No discrete lytic lesion to suggest plasmacytoma or focal multiple myeloma.  Additional ancillary findings as above.   Electronically Signed   By: Jacqulynn Cadet M.D.   On: 04/20/2014 10:50    Labs:  CBC:  Recent Labs  04/02/14 0547 04/18/14 1311 04/19/14 0538 04/20/14 1105  WBC 6.8 14.7* 15.9* 16.2*  HGB 9.9* 10.7* 9.5* 9.9*  HCT 30.2* 31.7* 27.7* 28.2*  PLT 245 291 279 300    COAGS:  Recent Labs  04/01/14 0525 04/18/14 1730 04/19/14 0538  INR 1.11 1.17 1.19  APTT  --  31  --     BMP:  Recent Labs  04/02/14 0547 04/18/14 1311 04/19/14 0538 04/20/14 1105  NA 143 129* 132* 130*  K 4.5 5.5* 4.4 4.2  CL 106 87* 89* 89*  CO2 $Re'22 21 22 'YUx$ 18*  GLUCOSE 89 159* 116* 157*  BUN 32* 75* 85* 100*  CALCIUM 8.6 8.1* 7.4* 6.8*  CREATININE 2.95* 9.24* 9.54* 9.93*  GFRNONAA 17* 4* 4* 4*  GFRAA 20* 5* 5* 5*    LIVER FUNCTION TESTS:  Recent Labs  04/01/14 0525 04/18/14 1311 04/19/14 0538 04/20/14 1105  BILITOT 0.3 0.3 0.3  --   AST $Re'19 22 21  'MhH$ --   ALT $Re'10 13 12  'qSe$ --   ALKPHOS 53 72 61  --   PROT 6.8 7.9 7.1  --   ALBUMIN 2.4* 2.2* 2.0* 1.9*    TUMOR MARKERS: No results for input(s): AFPTM,  CEA, CA199, CHROMGRNA in the last 8760 hours.  Assessment and Plan:  Acute renal failure/nephropathy prob MM Now scheduled for BM bx Pt and wife aware of procedure benefits and risks and agreeable to proceed Consent signed andin chart  Thank you for this interesting consult.  I greatly enjoyed meeting Miguel Compton and look forward to participating in their care.    I spent a total of 20 minutes face to face in clinical consultation, greater than 50% of which was counseling/coordinating care for BM bx  Signed: Karem Farha A 04/20/2014, 1:36 PM

## 2014-04-20 NOTE — Progress Notes (Signed)
Patient Demographics  Miguel Compton, is a 80 y.o. male, DOB - [birth year], WNU:[id number]  Admit date - 04/18/2014   Admitting Physician Costin Karlyne Greenspan, MD  Outpatient Primary MD for the patient is [name], MD  LOS - 2   Chief Complaint  Patient presents with  . Abnormal Lab        Subjective:   [name] today has, No headache, No chest pain, No abdominal pain - No Nausea, No new weakness tingling or numbness, No Cough - SOB.    Assessment & Plan    1. Acute renal failure. 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 Renal following, post R IJ dialysis catheter placement , currently holding dialysis. Oncology called along with renal.   2. SPEP concerning for an diagnosed multiple myeloma. Currently on Decadron. Oncology has been consulted. May require bone marrow biopsy.   3. History of CVA. No acute issues.   4. Dyslipidemia. On statin continue.   5. Mild dementia. On Aricept.   6. Essential hypertension. On Norvasc, dose increased and Coreg added for better control.    7. Obstipation. Initiated bowel regimen     Code Status: Full  Family Communication: Son-in-law  Disposition Plan: PT eval. May require placement.   Procedures   Renal biopsy 04/19/2014 Right IJ tunneled catheter for dialysis 04/19/2014 Due for bone marrow biopsy   Consults  renal, hematology, IR   Medications  Scheduled Meds: . amLODipine  10 mg Oral Daily  . bisacodyl  10 mg Rectal Daily  . carvedilol  3.125 mg Oral BID WC  . dexamethasone  4 mg Oral Q12H  . docusate sodium  200 mg Oral BID  . fluticasone  1 puff Inhalation BID  . pantoprazole  40 mg Oral Daily  . pravastatin  80 mg Oral q1800  . sodium chloride  3 mL Intravenous Q12H   Continuous Infusions:  PRN  Meds:.albuterol, ALPRAZolam, HYDROcodone-acetaminophen  DVT Prophylaxis    Heparin   Lab Results  Component Value Date   PLT 279 04/19/2014    Antibiotics     Anti-infectives    Start     Dose/Rate Route Frequency Ordered Stop   04/19/14 0930  ceFAZolin (ANCEF) IVPB 2 g/50 mL premix    Comments:  Hang ON CALL to xray 12/9   2 g100 mL/hr over 30 Minutes Intravenous On call 04/19/14 0917 04/19/14 1133          Objective:   Filed Vitals:   04/19/14 2103 04/19/14 2104 04/20/14 0443 04/20/14 0731  BP: 146/59  140/68   Pulse: 69 83 64   Temp: 97.8 F (36.6 C)  97.8 F (36.6 C)   TempSrc: Oral  Oral   Resp: $Remo'17 18 17   'Nognn$ SpO2: 96% 96% 95% 97%    Wt Readings from Last 3 Encounters:  03/31/14 84.823 kg (187 lb)  02/28/08 98.944 kg (218 lb 2.1 oz)  06/28/07 99.511 kg (219 lb 6.1 oz)     Intake/Output Summary (Last 24 hours) at 04/20/14 1110 Last data filed at 04/20/14 0900  Gross per 24 hour  Intake   1080 ml  Output      0 ml  Net   1080 ml     Physical  Exam  Awake Alert, Oriented X 3, No new F.N deficits, Normal affect [place].AT,PERRAL Supple Neck,No JVD, No cervical lymphadenopathy appriciated.  Symmetrical Chest wall movement, Good air movement bilaterally, CTAB, right IJ tunnel catheter for dialysis RRR,No Gallops,Rubs or new Murmurs, No Parasternal Heave +ve B.Sounds, Abd Soft, No tenderness, No organomegaly appriciated, No rebound - guarding or rigidity. No Cyanosis, Clubbing or edema, No new Rash or bruise      Data Review   Micro Results No results found for this or any previous visit (from the past 240 hour(s)).  Radiology Reports Dg Chest 2 View  04/18/2014   CLINICAL DATA:  ABNORMAL LAB, shortness of breath, COPD  EXAM: CHEST - 2 VIEW  COMPARISON:  08/25/2011  FINDINGS: Coarse bilateral interstitial opacities left greater than right, with relative sparing of the apices, increased since previous exam. Heart size is normal. No effusion. Visualized  skeletal structures are unremarkable.  IMPRESSION: 1. Progressive coarse bilateral asymmetric interstitial opacities, probably largely chronic.   Electronically Signed   By: Oley Balm M.D.   On: 04/18/2014 15:36   Ir Fluoro Guide Cv Line Right  04/19/2014   CLINICAL DATA:  Acute renal insufficiency, needs access for hemodialysis  EXAM: TUNNELED HEMODIALYSIS CATHETER PLACEMENT WITH ULTRASOUND AND FLUOROSCOPIC GUIDANCE  TECHNIQUE: The procedure, risks, benefits, and alternatives were explained to the patient. Questions regarding the procedure were encouraged and answered. The patient understands and consents to the procedure. As antibiotic prophylaxis, cefazolin 2 g was ordered pre-procedure and administered intravenously within one hour of incision.Patency of the right IJ vein was confirmed with ultrasound with image documentation. An appropriate skin site was determined. Region was prepped using maximum barrier technique including cap and mask, sterile gown, sterile gloves, large sterile sheet, and Chlorhexidine as cutaneous antisepsis. The region was infiltrated locally with 1% lidocaine.  Intravenous Fentanyl and Versed were administered as conscious sedation during continuous cardiorespiratory monitoring by the radiology RN, with a total moderate sedation time of 25 minutes.  Under real-time ultrasound guidance, the right IJ vein was accessed with a 21 gauge micropuncture needle; the needle tip within the vein was confirmed with ultrasound image documentation. Needle exchanged over the 018 guidewire for transitional dilator, which allowed advancement of a Benson wire into the IVC. Over this, an MPA catheter was advanced. A Hemosplit 19 hemodialysis catheter was tunneled from the right anterior chest wall approach to the right IJ dermatotomy site. The MPA catheter was exchanged over an Amplatz wire for serial vascular dilators which allow placement of a peel-away sheath, through which the catheter was  advanced under intermittent fluoroscopy, positioned with its tips in the proximal and midright atrium. Spot chest radiograph confirms good catheter position. No pneumothorax. Catheter was flushed and primed per protocol. Catheter secured externally with O Prolene sutures. The right IJ dermatotomy site was closed with Dermabond.  COMPLICATIONS: COMPLICATIONS None immediate  FLUOROSCOPY TIME:  48 seconds  COMPARISON:  None  IMPRESSION: 1. Technically successful placement of tunneled right IJ hemodialysis catheter with ultrasound and fluoroscopic guidance. Ready for routine use.  ACCESS: Remains approachable for percutaneous intervention as needed.   Electronically Signed   By: Oley Balm M.D.   On: 04/19/2014 12:10   Ir US Guide Vasc Access Right  04/19/2014   CLINICAL DATA:  Acute renal insufficiency, needs access for hemodialysis  EXAM: TUNNELED HEMODIALYSIS CATHETER PLACEMENT WITH ULTRASOUND AND FLUOROSCOPIC GUIDANCE  TECHNIQUE: The procedure, risks, benefits, and alternatives were explained to the patient. Questions regarding the procedure were encouraged  and answered. The patient understands and consents to the procedure. As antibiotic prophylaxis, cefazolin 2 g was ordered pre-procedure and administered intravenously within one hour of incision.Patency of the right IJ vein was confirmed with ultrasound with image documentation. An appropriate skin site was determined. Region was prepped using maximum barrier technique including cap and mask, sterile gown, sterile gloves, large sterile sheet, and Chlorhexidine as cutaneous antisepsis. The region was infiltrated locally with 1% lidocaine.  Intravenous Fentanyl and Versed were administered as conscious sedation during continuous cardiorespiratory monitoring by the radiology RN, with a total moderate sedation time of 25 minutes.  Under real-time ultrasound guidance, the right IJ vein was accessed with a 21 gauge micropuncture needle; the needle tip within  the vein was confirmed with ultrasound image documentation. Needle exchanged over the 018 guidewire for transitional dilator, which allowed advancement of a Benson wire into the IVC. Over this, an MPA catheter was advanced. A Hemosplit 19 hemodialysis catheter was tunneled from the right anterior chest wall approach to the right IJ dermatotomy site. The MPA catheter was exchanged over an Amplatz wire for serial vascular dilators which allow placement of a peel-away sheath, through which the catheter was advanced under intermittent fluoroscopy, positioned with its tips in the proximal and midright atrium. Spot chest radiograph confirms good catheter position. No pneumothorax. Catheter was flushed and primed per protocol. Catheter secured externally with O Prolene sutures. The right IJ dermatotomy site was closed with Dermabond.  COMPLICATIONS: COMPLICATIONS None immediate  FLUOROSCOPY TIME:  48 seconds  COMPARISON:  None  IMPRESSION: 1. Technically successful placement of tunneled right IJ hemodialysis catheter with ultrasound and fluoroscopic guidance. Ready for routine use.  ACCESS: Remains approachable for percutaneous intervention as needed.   Electronically Signed   By: Arne Cleveland M.D.   On: 04/19/2014 12:10   Ir US Guide Bx Asp/drain  04/19/2014   CLINICAL DATA:  Acute renal insufficiency  COMPARISON:  CT 03/21/2008  TECHNIQUE: ULTRASOUND GUIDED RENAL CORE BIOPSY  Survey ultrasound was performed and an appropriate skin entry site was localized. Site was marked, prepped with Betadine, draped in usual sterile fashion, infiltrated locally with 1% lidocaine. Intravenous Fentanyl and Versed were administered as conscious sedation during continuous cardiorespiratory monitoring by the radiology RN with a total moderate sedation time of 25 minutes.  Under real time ultrasound guidance, a 15 gauge trocar needle was advanced to the margin of the lower pole left kidney. The 16 gauge core needle was coaxially  advanced for 3 passes. The core samples were submitted to pathology. The patient tolerated procedure well.  COMPLICATIONS: COMPLICATIONS none  IMPRESSION: 1. Technically successful ultrasound-guided core renal biopsy , lower pole left kidney.   Electronically Signed   By: Arne Cleveland M.D.   On: 04/19/2014 12:12   Dg Bone Survey Met  04/20/2014   CLINICAL DATA:  80 year old male with multiple myeloma.  EXAM: METASTATIC BONE SURVEY  COMPARISON:  Recent prior osseous survey 04/13/2014  FINDINGS: Chest: No discrete lytic lesion in the visualized bones. Right IJ approach tunneled hemodialysis/pheresis catheter. The catheter tips project over the mid SVC. Mild cardiomegaly. Moderately large hiatal hernia. Atherosclerotic calcification noted in the transverse aorta. Stable appearance of left lingular opacity, background bronchitic changes and interstitial prominence. No new acute cardiopulmonary finding.  Spine: Limited visualization of the cervical spine beyond the level of C6 secondary to overlap of the shoulder girdles. Multilevel cervical spondylosis. Right worse than left facet arthropathy. No discrete lytic osseous lesion. Atherosclerotic calcification present in the  region of the left carotid bifurcation. Multilevel degenerative spurring throughout the thoracic spine. No evidence of thoracic compression fracture. Degenerative spurring continues to the lumbar spine. There is moderate facet arthropathy at L4-L5 and L5-S1. No discrete lytic lesion.  Skull: No discrete osseous lesion. Normal aeration of the paranasal sinuses.  Upper extremities: No discrete lytic osseous lesion. No acute fracture or malalignment. Nonunion of remote healed fracture involving the right ulnar styloid.  Pelvis: No discrete lytic osseous lesion. The bones are mildly osteopenic. Brachy therapy seeds project over the prostate gland. Mild bilateral hip joint osteoarthritis.  Lower extremities: No discrete lytic lesion. Mild scattered  atherosclerotic vascular calcifications.  IMPRESSION: No discrete lytic lesion to suggest plasmacytoma or focal multiple myeloma.  Additional ancillary findings as above.   Electronically Signed   By: Jacqulynn Cadet M.D.   On: 04/20/2014 10:50     CBC  Recent Labs Lab 04/18/14 1311 04/19/14 0538  WBC 14.7* 15.9*  HGB 10.7* 9.5*  HCT 31.7* 27.7*  PLT 291 279  MCV 90.6 87.4  MCH 30.6 30.0  MCHC 33.8 34.3  RDW 12.9 12.8  LYMPHSABS 1.0  --   MONOABS 0.3  --   EOSABS 0.0  --   BASOSABS 0.0  --     Chemistries   Recent Labs Lab 04/18/14 1311 04/19/14 0538  NA 129* 132*  K 5.5* 4.4  CL 87* 89*  CO2 21 22  GLUCOSE 159* 116*  BUN 75* 85*  CREATININE 9.24* 9.54*  CALCIUM 8.1* 7.4*  AST 22 21  ALT 13 12  ALKPHOS 72 61  BILITOT 0.3 0.3   ------------------------------------------------------------------------------------------------------------------ CrCl cannot be calculated (Unknown ideal weight.). ------------------------------------------------------------------------------------------------------------------ No results for input(s): HGBA1C in the last 72 hours. ------------------------------------------------------------------------------------------------------------------ No results for input(s): CHOL, HDL, LDLCALC, TRIG, CHOLHDL, LDLDIRECT in the last 72 hours. ------------------------------------------------------------------------------------------------------------------ No results for input(s): TSH, T4TOTAL, T3FREE, THYROIDAB in the last 72 hours.  Invalid input(s): FREET3 ------------------------------------------------------------------------------------------------------------------ No results for input(s): VITAMINB12, FOLATE, FERRITIN, TIBC, IRON, RETICCTPCT in the last 72 hours.  Coagulation profile  Recent Labs Lab 04/18/14 1730 04/19/14 0538  INR 1.17 1.19    No results for input(s): DDIMER in the last 72 hours.  Cardiac Enzymes No results  for input(s): CKMB, TROPONINI, MYOGLOBIN in the last 168 hours.  Invalid input(s): CK ------------------------------------------------------------------------------------------------------------------ Invalid input(s): POCBNP     Time Spent in minutes   35   Alveda Vanhorne K M.D on 04/20/2014 at 11:10 AM  Between 7am to 7pm - Pager - [phone]  After 7pm go to [email or web address] - password [email or web address]  And look for the night coverage person covering for me after hours  [facility] Office  [phone]

## 2014-04-20 NOTE — Plan of Care (Signed)
Problem: Phase II Progression Outcomes Goal: Dyspnea controlled at rest Outcome: Completed/Met Date Met:  04/20/14

## 2014-04-21 ENCOUNTER — Ambulatory Visit: Payer: Medicare Other | Admitting: Oncology

## 2014-04-21 ENCOUNTER — Ambulatory Visit: Payer: Medicare Other

## 2014-04-21 ENCOUNTER — Inpatient Hospital Stay (HOSPITAL_COMMUNITY): Payer: Medicare Other

## 2014-04-21 DIAGNOSIS — D649 Anemia, unspecified: Secondary | ICD-10-CM

## 2014-04-21 LAB — RENAL FUNCTION PANEL
ALBUMIN: 1.8 g/dL — AB (ref 3.5–5.2)
ANION GAP: 24 — AB (ref 5–15)
BUN: 109 mg/dL — ABNORMAL HIGH (ref 6–23)
CHLORIDE: 88 meq/L — AB (ref 96–112)
CO2: 19 mEq/L (ref 19–32)
Calcium: 6.7 mg/dL — ABNORMAL LOW (ref 8.4–10.5)
Creatinine, Ser: 10.74 mg/dL — ABNORMAL HIGH (ref 0.50–1.35)
GFR calc Af Amer: 4 mL/min — ABNORMAL LOW (ref >90)
GFR, EST NON AFRICAN AMERICAN: 4 mL/min — AB (ref >90)
Glucose, Bld: 132 mg/dL — ABNORMAL HIGH (ref 70–99)
PHOSPHORUS: 8.3 mg/dL — AB (ref 2.3–4.6)
Potassium: 4.5 mEq/L (ref 3.7–5.3)
SODIUM: 131 meq/L — AB (ref 137–147)

## 2014-04-21 LAB — CBC
HCT: 26.4 % — ABNORMAL LOW (ref 39.0–52.0)
Hemoglobin: 9.1 g/dL — ABNORMAL LOW (ref 13.0–17.0)
MCH: 29.6 pg (ref 26.0–34.0)
MCHC: 34.5 g/dL (ref 30.0–36.0)
MCV: 86 fL (ref 78.0–100.0)
PLATELETS: 281 10*3/uL (ref 150–400)
RBC: 3.07 MIL/uL — ABNORMAL LOW (ref 4.22–5.81)
RDW: 12.9 % (ref 11.5–15.5)
WBC: 15.1 10*3/uL — AB (ref 4.0–10.5)

## 2014-04-21 LAB — BONE MARROW EXAM

## 2014-04-21 MED ORDER — FENTANYL CITRATE 0.05 MG/ML IJ SOLN
INTRAMUSCULAR | Status: AC | PRN
  Administered 2014-04-21: 50 ug via INTRAVENOUS

## 2014-04-21 MED ORDER — MIDAZOLAM HCL 2 MG/2ML IJ SOLN
INTRAMUSCULAR | Status: AC | PRN
  Administered 2014-04-21: 1 mg via INTRAVENOUS

## 2014-04-21 MED ORDER — ACYCLOVIR 400 MG PO TABS: 400.0000 mg | ORAL_TABLET | Freq: Two times a day (BID) | ORAL | Status: DC

## 2014-04-21 MED ORDER — NEPRO/CARBSTEADY PO LIQD
237.0000 mL | Freq: Every day | ORAL | Status: DC
  Administered 2014-04-21 – 2014-05-03 (×7): 237 mL via ORAL

## 2014-04-21 MED ORDER — LIDOCAINE HCL 1 % IJ SOLN
INTRAMUSCULAR | Status: AC
  Filled 2014-04-21: qty 20

## 2014-04-21 MED ORDER — FENTANYL CITRATE 0.05 MG/ML IJ SOLN
INTRAMUSCULAR | Status: AC
  Filled 2014-04-21: qty 4

## 2014-04-21 MED ORDER — PROCHLORPERAZINE MALEATE 10 MG PO TABS: 10.0000 mg | ORAL_TABLET | Freq: Four times a day (QID) | ORAL | Status: DC | PRN

## 2014-04-21 MED ORDER — ACYCLOVIR 200 MG PO CAPS
400.0000 mg | ORAL_CAPSULE | Freq: Two times a day (BID) | ORAL | Status: DC
  Administered 2014-04-21 – 2014-05-01 (×20): 400 mg via ORAL
  Filled 2014-04-21 (×23): qty 2

## 2014-04-21 MED ORDER — MIDAZOLAM HCL 2 MG/2ML IJ SOLN
INTRAMUSCULAR | Status: AC
Start: 2014-04-21 — End: 2014-04-21
  Filled 2014-04-21: qty 4

## 2014-04-21 MED ORDER — PROCHLORPERAZINE MALEATE 10 MG PO TABS
10.0000 mg | ORAL_TABLET | Freq: Four times a day (QID) | ORAL | Status: DC | PRN
  Filled 2014-04-21: qty 1

## 2014-04-21 NOTE — Sedation Documentation (Signed)
Patient is resting comfortably. 

## 2014-04-21 NOTE — Progress Notes (Signed)
Medicare Important Message given? YES  (If response is "NO", the following Medicare IM given date fields will be blank)  Date Medicare IM given:  04/21/2014 Medicare IM given by: Verlene Glantz  

## 2014-04-21 NOTE — Progress Notes (Signed)
Patient Demographics  Miguel Compton, is a 80 y.o. male, DOB - December 21, 1921, ZDG:[id number]  Admit date - 04/18/2014   Admitting Physician Costin Karlyne Greenspan, MD  Outpatient Primary MD for the patient is [name], MD  LOS - 3   Chief Complaint  Patient presents with  . Abnormal Lab        Subjective:   Miguel Compton today has, No headache, No chest pain, No abdominal pain - No Nausea, No new weakness tingling or numbness, No Cough - SOB.    Assessment & Plan    1. Acute renal failure. 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 Renal following, post R IJ dialysis catheter placement , currently holding dialysis. Oncology called along with renal. Renal biopsy done and results pending.   2. SPEP concerning for an diagnosed multiple myeloma. Currently on Decadron. Oncology has been consulted.  IR to do bone marrow biopsy 04/21/2014.   3. History of CVA. No acute issues.   4. Dyslipidemia. On statin continue.   5. Mild dementia. On Aricept.   6. Essential hypertension. On Norvasc dose increased and Coreg added for better control.    7. Constipation. Initiated bowel regimen, + BM     Code Status: Full  Family Communication: Son-in-law  Disposition Plan: PT eval. May require placement.   Procedures   Renal biopsy 04/19/2014 Right IJ tunneled catheter for dialysis 04/19/2014 Bone marrow biopsy 04/21/2014   Consults  Renal, Hematology, IR   Medications  Scheduled Meds: . amLODipine  10 mg Oral Daily  . bisacodyl  10 mg Rectal Daily  . carvedilol  3.125 mg Oral BID WC  . dexamethasone  4 mg Oral Q12H  . docusate sodium  200 mg Oral BID  . fentaNYL      . fluticasone  1 puff Inhalation BID  . lidocaine      . midazolam      . pantoprazole  40 mg Oral Daily   . pravastatin  80 mg Oral q1800  . sodium chloride  3 mL Intravenous Q12H   Continuous Infusions:  PRN Meds:.albuterol, ALPRAZolam, HYDROcodone-acetaminophen  DVT Prophylaxis    Heparin   Lab Results  Component Value Date   PLT 281 04/21/2014    Antibiotics     Anti-infectives    Start     Dose/Rate Route Frequency Ordered Stop   04/19/14 0930  ceFAZolin (ANCEF) IVPB 2 g/50 mL premix    Comments:  Hang ON CALL to xray 12/9   2 g100 mL/hr over 30 Minutes Intravenous On call 04/19/14 0917 04/19/14 1133          Objective:   Filed Vitals:   04/21/14 0925 04/21/14 0941 04/21/14 0955 04/21/14 0956  BP: 135/63 132/64 144/62   Pulse: 69 68 66   Temp:   97.4 F (36.3 C)   TempSrc:   Oral   Resp: $Remo'17 17 18   'xWkNj$ Weight:      SpO2: 99% 96% 97% 95%    Wt Readings from Last 3 Encounters:  04/20/14 84.823 kg (187 lb)  03/31/14 84.823 kg (187 lb)  02/28/08 98.944 kg (218 lb 2.1 oz)     Intake/Output Summary (Last 24 hours) at 04/21/14 1012 Last data filed at 04/21/14  0959  Gross per 24 hour  Intake    600 ml  Output    200 ml  Net    400 ml     Physical Exam  Awake Alert, Oriented X 3, No new F.N deficits, Normal affect [place].AT,PERRAL Supple Neck,No JVD, No cervical lymphadenopathy appriciated.  Symmetrical Chest wall movement, Good air movement bilaterally, CTAB, right IJ tunnel catheter for dialysis RRR,No Gallops,Rubs or new Murmurs, No Parasternal Heave +ve B.Sounds, Abd Soft, No tenderness, No organomegaly appriciated, No rebound - guarding or rigidity. No Cyanosis, Clubbing or edema, No new Rash or bruise      Data Review   Micro Results No results found for this or any previous visit (from the past 240 hour(s)).  Radiology Reports Dg Chest 2 View  04/18/2014   CLINICAL DATA:  ABNORMAL LAB, shortness of breath, COPD  EXAM: CHEST - 2 VIEW  COMPARISON:  08/25/2011  FINDINGS: Coarse bilateral interstitial opacities left greater than right, with relative  sparing of the apices, increased since previous exam. Heart size is normal. No effusion. Visualized skeletal structures are unremarkable.  IMPRESSION: 1. Progressive coarse bilateral asymmetric interstitial opacities, probably largely chronic.   Electronically Signed   By: Arne Cleveland M.D.   On: 04/18/2014 15:36   Ir Fluoro Guide Cv Line Right  04/19/2014   CLINICAL DATA:  Acute renal insufficiency, needs access for hemodialysis  EXAM: TUNNELED HEMODIALYSIS CATHETER PLACEMENT WITH ULTRASOUND AND FLUOROSCOPIC GUIDANCE  TECHNIQUE: The procedure, risks, benefits, and alternatives were explained to the patient. Questions regarding the procedure were encouraged and answered. The patient understands and consents to the procedure. As antibiotic prophylaxis, cefazolin 2 g was ordered pre-procedure and administered intravenously within one hour of incision.Patency of the right IJ vein was confirmed with ultrasound with image documentation. An appropriate skin site was determined. Region was prepped using maximum barrier technique including cap and mask, sterile gown, sterile gloves, large sterile sheet, and Chlorhexidine as cutaneous antisepsis. The region was infiltrated locally with 1% lidocaine.  Intravenous Fentanyl and Versed were administered as conscious sedation during continuous cardiorespiratory monitoring by the radiology RN, with a total moderate sedation time of 25 minutes.  Under real-time ultrasound guidance, the right IJ vein was accessed with a 21 gauge micropuncture needle; the needle tip within the vein was confirmed with ultrasound image documentation. Needle exchanged over the 018 guidewire for transitional dilator, which allowed advancement of a Benson wire into the IVC. Over this, an MPA catheter was advanced. A Hemosplit 19 hemodialysis catheter was tunneled from the right anterior chest wall approach to the right IJ dermatotomy site. The MPA catheter was exchanged over an Amplatz wire for  serial vascular dilators which allow placement of a peel-away sheath, through which the catheter was advanced under intermittent fluoroscopy, positioned with its tips in the proximal and midright atrium. Spot chest radiograph confirms good catheter position. No pneumothorax. Catheter was flushed and primed per protocol. Catheter secured externally with O Prolene sutures. The right IJ dermatotomy site was closed with Dermabond.  COMPLICATIONS: COMPLICATIONS None immediate  FLUOROSCOPY TIME:  48 seconds  COMPARISON:  None  IMPRESSION: 1. Technically successful placement of tunneled right IJ hemodialysis catheter with ultrasound and fluoroscopic guidance. Ready for routine use.  ACCESS: Remains approachable for percutaneous intervention as needed.   Electronically Signed   By: Arne Cleveland M.D.   On: 04/19/2014 12:10   Ir US Guide Vasc Access Right  04/19/2014   CLINICAL DATA:  Acute renal insufficiency, needs access  for hemodialysis  EXAM: TUNNELED HEMODIALYSIS CATHETER PLACEMENT WITH ULTRASOUND AND FLUOROSCOPIC GUIDANCE  TECHNIQUE: The procedure, risks, benefits, and alternatives were explained to the patient. Questions regarding the procedure were encouraged and answered. The patient understands and consents to the procedure. As antibiotic prophylaxis, cefazolin 2 g was ordered pre-procedure and administered intravenously within one hour of incision.Patency of the right IJ vein was confirmed with ultrasound with image documentation. An appropriate skin site was determined. Region was prepped using maximum barrier technique including cap and mask, sterile gown, sterile gloves, large sterile sheet, and Chlorhexidine as cutaneous antisepsis. The region was infiltrated locally with 1% lidocaine.  Intravenous Fentanyl and Versed were administered as conscious sedation during continuous cardiorespiratory monitoring by the radiology RN, with a total moderate sedation time of 25 minutes.  Under real-time ultrasound  guidance, the right IJ vein was accessed with a 21 gauge micropuncture needle; the needle tip within the vein was confirmed with ultrasound image documentation. Needle exchanged over the 018 guidewire for transitional dilator, which allowed advancement of a Benson wire into the IVC. Over this, an MPA catheter was advanced. A Hemosplit 19 hemodialysis catheter was tunneled from the right anterior chest wall approach to the right IJ dermatotomy site. The MPA catheter was exchanged over an Amplatz wire for serial vascular dilators which allow placement of a peel-away sheath, through which the catheter was advanced under intermittent fluoroscopy, positioned with its tips in the proximal and midright atrium. Spot chest radiograph confirms good catheter position. No pneumothorax. Catheter was flushed and primed per protocol. Catheter secured externally with O Prolene sutures. The right IJ dermatotomy site was closed with Dermabond.  COMPLICATIONS: COMPLICATIONS None immediate  FLUOROSCOPY TIME:  48 seconds  COMPARISON:  None  IMPRESSION: 1. Technically successful placement of tunneled right IJ hemodialysis catheter with ultrasound and fluoroscopic guidance. Ready for routine use.  ACCESS: Remains approachable for percutaneous intervention as needed.   Electronically Signed   By: Arne Cleveland M.D.   On: 04/19/2014 12:10   Ir US Guide Bx Asp/drain  04/19/2014   CLINICAL DATA:  Acute renal insufficiency  COMPARISON:  CT 03/21/2008  TECHNIQUE: ULTRASOUND GUIDED RENAL CORE BIOPSY  Survey ultrasound was performed and an appropriate skin entry site was localized. Site was marked, prepped with Betadine, draped in usual sterile fashion, infiltrated locally with 1% lidocaine. Intravenous Fentanyl and Versed were administered as conscious sedation during continuous cardiorespiratory monitoring by the radiology RN with a total moderate sedation time of 25 minutes.  Under real time ultrasound guidance, a 15 gauge trocar needle  was advanced to the margin of the lower pole left kidney. The 16 gauge core needle was coaxially advanced for 3 passes. The core samples were submitted to pathology. The patient tolerated procedure well.  COMPLICATIONS: COMPLICATIONS none  IMPRESSION: 1. Technically successful ultrasound-guided core renal biopsy , lower pole left kidney.   Electronically Signed   By: Arne Cleveland M.D.   On: 04/19/2014 12:12   Dg Bone Survey Met  04/20/2014   CLINICAL DATA:  80 year old male with multiple myeloma.  EXAM: METASTATIC BONE SURVEY  COMPARISON:  Recent prior osseous survey 04/13/2014  FINDINGS: Chest: No discrete lytic lesion in the visualized bones. Right IJ approach tunneled hemodialysis/pheresis catheter. The catheter tips project over the mid SVC. Mild cardiomegaly. Moderately large hiatal hernia. Atherosclerotic calcification noted in the transverse aorta. Stable appearance of left lingular opacity, background bronchitic changes and interstitial prominence. No new acute cardiopulmonary finding.  Spine: Limited visualization of the cervical  spine beyond the level of C6 secondary to overlap of the shoulder girdles. Multilevel cervical spondylosis. Right worse than left facet arthropathy. No discrete lytic osseous lesion. Atherosclerotic calcification present in the region of the left carotid bifurcation. Multilevel degenerative spurring throughout the thoracic spine. No evidence of thoracic compression fracture. Degenerative spurring continues to the lumbar spine. There is moderate facet arthropathy at L4-L5 and L5-S1. No discrete lytic lesion.  Skull: No discrete osseous lesion. Normal aeration of the paranasal sinuses.  Upper extremities: No discrete lytic osseous lesion. No acute fracture or malalignment. Nonunion of remote healed fracture involving the right ulnar styloid.  Pelvis: No discrete lytic osseous lesion. The bones are mildly osteopenic. Brachy therapy seeds project over the prostate gland. Mild  bilateral hip joint osteoarthritis.  Lower extremities: No discrete lytic lesion. Mild scattered atherosclerotic vascular calcifications.  IMPRESSION: No discrete lytic lesion to suggest plasmacytoma or focal multiple myeloma.  Additional ancillary findings as above.   Electronically Signed   By: Jacqulynn Cadet M.D.   On: 04/20/2014 10:50     CBC  Recent Labs Lab 04/18/14 1311 04/19/14 0538 04/20/14 1105 04/21/14 0500  WBC 14.7* 15.9* 16.2* 15.1*  HGB 10.7* 9.5* 9.9* 9.1*  HCT 31.7* 27.7* 28.2* 26.4*  PLT 291 279 300 281  MCV 90.6 87.4 88.7 86.0  MCH 30.6 30.0 31.1 29.6  MCHC 33.8 34.3 35.1 34.5  RDW 12.9 12.8 12.9 12.9  LYMPHSABS 1.0  --   --   --   MONOABS 0.3  --   --   --   EOSABS 0.0  --   --   --   BASOSABS 0.0  --   --   --     Chemistries   Recent Labs Lab 04/18/14 1311 04/19/14 0538 04/20/14 1105 04/21/14 0518  NA 129* 132* 130* 131*  K 5.5* 4.4 4.2 4.5  CL 87* 89* 89* 88*  CO2 21 22 18* 19  GLUCOSE 159* 116* 157* 132*  BUN 75* 85* 100* 109*  CREATININE 9.24* 9.54* 9.93* 10.74*  CALCIUM 8.1* 7.4* 6.8* 6.7*  AST 22 21  --   --   ALT 13 12  --   --   ALKPHOS 72 61  --   --   BILITOT 0.3 0.3  --   --    ------------------------------------------------------------------------------------------------------------------ CrCl cannot be calculated (Unknown ideal weight.). ------------------------------------------------------------------------------------------------------------------ No results for input(s): HGBA1C in the last 72 hours. ------------------------------------------------------------------------------------------------------------------ No results for input(s): CHOL, HDL, LDLCALC, TRIG, CHOLHDL, LDLDIRECT in the last 72 hours. ------------------------------------------------------------------------------------------------------------------ No results for input(s): TSH, T4TOTAL, T3FREE, THYROIDAB in the last 72 hours.  Invalid input(s):  FREET3 ------------------------------------------------------------------------------------------------------------------ No results for input(s): VITAMINB12, FOLATE, FERRITIN, TIBC, IRON, RETICCTPCT in the last 72 hours.  Coagulation profile  Recent Labs Lab 04/18/14 1730 04/19/14 0538  INR 1.17 1.19    No results for input(s): DDIMER in the last 72 hours.  Cardiac Enzymes No results for input(s): CKMB, TROPONINI, MYOGLOBIN in the last 168 hours.  Invalid input(s): CK ------------------------------------------------------------------------------------------------------------------ Invalid input(s): POCBNP     Time Spent in minutes   35   SINGH,PRASHANT K M.D on 04/21/2014 at 10:12 AM  Between 7am to 7pm - Pager - [phone]  After 7pm go to [email or web address] - password [email or web address]  And look for the night coverage person covering for me after hours  [facility] Office  [phone]

## 2014-04-21 NOTE — Sedation Documentation (Addendum)
Patient denies pain and is resting comfortably on his back back in his bed

## 2014-04-21 NOTE — Procedures (Signed)
Successful RT ILIAC BM ASP AND CORE BX NO COMP STABLE PATH PENDING 

## 2014-04-21 NOTE — Progress Notes (Signed)
Admit: 04/18/2014 LOS: 3  46M, independent and full IADLs, with progressive AKI, +SPEP and [employer]. Concern for plasma cell related renal disorder.  Subjective:  For BM Bx this AM Hb stable post renal Biopsy  12/10 0701 - 12/11 0700 In: 840 [P.O.:840] Out: 200 [Urine:200]  Filed Weights   04/20/14 2016  Weight: 84.823 kg (187 lb)    Scheduled Meds: . amLODipine  10 mg Oral Daily  . bisacodyl  10 mg Rectal Daily  . carvedilol  3.125 mg Oral BID WC  . dexamethasone  4 mg Oral Q12H  . docusate sodium  200 mg Oral BID  . fentaNYL      . fluticasone  1 puff Inhalation BID  . lidocaine      . midazolam      . pantoprazole  40 mg Oral Daily  . pravastatin  80 mg Oral q1800  . sodium chloride  3 mL Intravenous Q12H   Continuous Infusions:  PRN Meds:.albuterol, ALPRAZolam, HYDROcodone-acetaminophen  Current Labs: reviewed    Physical Exam:  Blood pressure 132/64, pulse 68, temperature 97.9 F (36.6 C), temperature source Oral, resp. rate 17, weight 84.823 kg (187 lb), SpO2 96 %. GEN: NAD, pleasant ENT: HOH, NCAT EYES: EOMI, arcus corenalis CV: RRR, no rub. Nl s1, s2 3/6 MSM at RUSB PULM: CTAB, nl crackles ABD: obese, nabs, s/nt/nd SKIN: no rashes/lesions FOY:DXAJO - 1+ LEE NEURO: nonfocal  A/P 1. AKI, suspect myeloma cast nephropathy vs other cause 1. Nearing RRT, no strong indication right now; trying to sort out global plans 2. Renal Bx and [facility] 04/19/14; perhaps prelim results this afternoon 3. I think if pt with cast nephropathy, reasonable to do trial of TPE 1. Would like to know extent of fibrosis / scarring on biopsy 2. Did have relatively normal GFR a few months ago 4. Complements negative 5. Framed the entirety of these decisions in his goals at age 89 and likelihood of helping him continue to accomplish those 2. + SPEP, sFLC; concern for Myeloma 1. Started decadron 04/17/14 -- contibuting to azotemia 2. Oncology consulted 3. As above 3. Hx/o  CVA 1. On ASA 81 for secondary prevention, held for renal biopsy 4. Hyperkalemia, resolved 1. Would not treat K < 6 2. Follow, no culprit medications 5. Hyponatremia, mild, asymptomatic 1. Restrict 1.5L fluids 2. Caused by fluids in excess of ability to excrete water  [name] MD 04/21/2014, 9:53 AM   Recent Labs Lab 04/19/14 0538 04/20/14 1105 04/21/14 0518  NA 132* 130* 131*  K 4.4 4.2 4.5  CL 89* 89* 88*  CO2 22 18* 19  GLUCOSE 116* 157* 132*  BUN 85* 100* 109*  CREATININE 9.54* 9.93* 10.74*  CALCIUM 7.4* 6.8* 6.7*  PHOS  --  8.5* 8.3*    Recent Labs Lab 04/18/14 1311 04/19/14 0538 04/20/14 1105 04/21/14 0500  WBC 14.7* 15.9* 16.2* 15.1*  NEUTROABS 13.4*  --   --   --   HGB 10.7* 9.5* 9.9* 9.1*  HCT 31.7* 27.7* 28.2* 26.4*  MCV 90.6 87.4 88.7 86.0  PLT 291 279 300 281

## 2014-04-21 NOTE — Progress Notes (Addendum)
SUBJECTIVE: Tired and sleepy but in excellent spirits  OBJECTIVE PHYSICAL EXAMINATION: ECOG PERFORMANCE STATUS: 3 - Symptomatic, >50% confined to bed  Filed Vitals:   04/21/14 1135  BP: 139/63  Pulse: 70  Temp:   Resp:    Filed Weights   04/20/14 2016  Weight: 187 lb (84.823 kg)    GENERAL:alert, no distress and comfortable OROPHARYNX:no exudate, no erythema and lips, buccal mucosa, and tongue normal  NECK: supple LUNGS: clear to auscultation and percussion with normal breathing effort HEART: regular rate & rhythm and no murmurs and no lower extremity edema ABDOMEN:abdomen soft, non-tender and normal bowel sounds Musculoskeletal:no cyanosis of digits and no clubbing  NEURO: alert & oriented x 3 with fluent speech, no focal motor/sensory deficits  LABORATORY DATA:  I have reviewed the data as listed $RemoveB'@[email or web address]'uiCjeOEz$ @  Lab Results  Component Value Date   WBC 15.1* 04/21/2014   HGB 9.1* 04/21/2014   HCT 26.4* 04/21/2014   MCV 86.0 04/21/2014   PLT 281 04/21/2014   NEUTROABS 13.4* 04/18/2014    ASSESSMENT AND PLAN:  1. Multiple myeloma IgG lambda 2 g monoclonal protein with a Kappa: Lambda ratio of 0.06, presenting as acute renal failure and anemia. Bone marrow biopsy was done today. Renal biopsy was done 2 days ago. Based on strong clinical suspicion of multiple myeloma and worsening renal failure, I counseled the family about different treatment options and we decided to initiate treatment with Velcade 1.3 mg/m subcutaneous once a week. We will try to give the first dose either today or tomorrow.  2. renal failure: Nephrology is evaluating different options including plasmapheresis and hemodialysis. It would be ideal to withhold dialysis for 24 hours after giving Velcade injection. Plasmapheresis would have the same effect on Velcade as well.  3. Family meeting: Patient and his family both elected to consider doing myeloma therapy fully understanding that the treatment is  palliative in nature. It could prolong survival but cannot cure his myeloma. It is also not certain that we will have full recovery of his renal function. We're also certain how long and whether or not his renal function would improve.  If he gets discharged home, we will need to set him up for a follow-up next Friday at the cancer center at [facility]. He will need to go home on dexamethasone 20 mg to be taken once a week on Fridays.   Velcade dose: 1.3 mg/m2 weekly Sub Q Dexamethasone: 20 mg weekly PO

## 2014-04-21 NOTE — Sedation Documentation (Addendum)
Bp cuff moved to Lt arm d/t systolic readings in the 981S x 3 on rt lower leg.  Discussed with Dr. Annamaria Boots, moved to bent arm to see if readings improve.

## 2014-04-21 NOTE — Progress Notes (Signed)
Pt reports increasing pressure/discomfort to LUQ.  He reports he feels relief after urinating, but the closer it gets to him urinating again the discomfort comes back. Denies pain on palpation. Declines pain medication at this time. Will continue to monitor. Report off in the AM for further evaluation. Dorthey Sawyer, RN

## 2014-04-21 NOTE — Progress Notes (Signed)
Bladder scan showed 0 ml residual. Scan performed after pt voided.

## 2014-04-21 NOTE — Progress Notes (Signed)
Physical Therapy Treatment Patient Details Name: Miguel Compton MRN: [id number] DOB: 11/22/1921 Today's Date: 04/21/2014    History of Present Illness Pt is a 80 y.o. male with Past medical history of COPD, dementia, depression, CVA, overflow incontinence. The patient is presenting with complaints of an abnormal lab. He mentions that since last 2 months he has been having generalized malaise, leg swelling, and poor appetite with weight loss as throughout the year he has been busy taking care of his wife who has been hospitalized multiple times. He has noticed he has some right-sided flank pain ongoing since last one week. He also mentions that he has occasional episodes of constipation followed by episodes of bowel movements which are consistent only mucousy output for last a year or so.    PT Comments    Pt progressing towards physical therapy goals. Pt was able to demonstrate improved balance this session, however had one minor LOB while leaning forward to adjust bed pad at end of gait training. Pt generally does well ambulating without an AD and pt states his walking is at baseline. Pt appears to have good family support. Recommending HHPT to follow up for improved safety, balance training, and continued strengthening after d/c. Will continue to follow.   Follow Up Recommendations  Supervision - Intermittent;Home health PT     Equipment Recommendations  None recommended by PT    Recommendations for Other Services       Precautions / Restrictions Precautions Precautions: Fall Restrictions Weight Bearing Restrictions: No    Mobility  Bed Mobility Overal bed mobility: Needs Assistance Bed Mobility: Supine to Sit     Supine to sit: Supervision     General bed mobility comments: Pt was able to transition to EOB with no physical assistance.   Transfers Overall transfer level: Needs assistance Equipment used: None Transfers: Sit to/from Stand Sit to Stand: Min assist          General transfer comment: Pt able to power-up to full standing position without assistance. No unsteadiness or LOB noted.   Ambulation/Gait Ambulation/Gait assistance: Min guard Ambulation Distance (Feet): 480 Feet Assistive device: None Gait Pattern/deviations: Step-through pattern;Decreased stride length;Trunk flexed Gait velocity: Decreased Gait velocity interpretation: Below normal speed for age/gender General Gait Details: Pt did not use a RW today, and did not demonstrate any unsteadiness or LOB. At very end of gait training pt leaned to the bed to adjust bed pad and had 1 LOB anteriorly in which assist was required to recover.    Stairs            Wheelchair Mobility    Modified Rankin (Stroke Patients Only)       Balance Overall balance assessment: Needs assistance Sitting-balance support: Feet supported;No upper extremity supported Sitting balance-Leahy Scale: Fair     Standing balance support: No upper extremity supported Standing balance-Leahy Scale: Fair                      Cognition Arousal/Alertness: Awake/alert Behavior During Therapy: WFL for tasks assessed/performed Overall Cognitive Status: Within Functional Limits for tasks assessed                      Exercises      General Comments        Pertinent Vitals/Pain Pain Assessment: No/denies pain    Home Living  Prior Function            PT Goals (current goals can now be found in the care plan section) Acute Rehab PT Goals Patient Stated Goal: Return home to his wife of 23 years PT Goal Formulation: With patient Time For Goal Achievement: 04/27/14 Potential to Achieve Goals: Good Progress towards PT goals: Progressing toward goals    Frequency  Min 3X/week    PT Plan Current plan remains appropriate    Co-evaluation             End of Session Equipment Utilized During Treatment: Gait belt Activity Tolerance: Patient  tolerated treatment well Patient left: in chair;with chair alarm set;with call bell/phone within reach     Time: 1539-1605 PT Time Calculation (min) (ACUTE ONLY): 26 min  Charges:  $Gait Training: 8-22 mins $Therapeutic Activity: 8-22 mins                    G Codes:      Rolinda Roan 05-20-2014, 5:27 PM  Rolinda Roan, PT, DPT Acute Rehabilitation Services Pager: [phone]

## 2014-04-21 NOTE — Progress Notes (Signed)
NUTRITION FOLLOW UP  INTERVENTION: Provide Nepro Shake po once daily, each supplement provides 425 kcal and 19 grams protein  Encourage adequate PO intake.   NUTRITION DIAGNOSIS: Increased nutrient needs related to acute renal failure as evidenced by estimated nutrition needs; ongoing  Goal: Pt to meet >/= 90% of their estimated nutrition needs; progressing  Monitor:  PO intake, weight trends, labs, I/O's  80 y.o. male  Admitting Dx: Renal insufficiency  ASSESSMENT: Pt with a past medical history significant for COPD, hypertension, hyperlipidemia, mild dementia, history of CVA, presents to the emergency room as he was sent by his doctor for abnormal blood work.   12/9: R IJ HD cathter placement with [place] and fluoroscopy and renal bx 12/11: RT ILIAC BM ASP AND CORE BX  Pt has been advanced to a renal diet with 1200 ml. Meal completion prior to procedures have been 90-100%. Pt reports he would like to try Nepro. RD to order. Encourage adequate PO intake.   Labs: Low sodium, calcium, chloride, and GFR. High BUN and creatinine.   Height: Ht Readings from Last 1 Encounters:  02/28/08 5\' 10"  (1.778 m)    Weight: Wt Readings from Last 1 Encounters:  04/20/14 187 lb (84.823 kg)    BMI:  Body Mass Index: 26.83 kg/(m^2)   Re-Estimated Nutritional Needs: Kcal: 1850-2100 Protein: 90-110 grams Fluid: 1.2 L/day  Skin: non-pitting LE edema; incision lower back, and right pelvis  Diet Order: Diet renal W/1293mL fluid restriction    Intake/Output Summary (Last 24 hours) at 04/21/14 1356 Last data filed at 04/21/14 0959  Gross per 24 hour  Intake    360 ml  Output    200 ml  Net    160 ml    Last BM: 12/11  Labs:   Recent Labs Lab 04/19/14 0538 04/20/14 1105 04/21/14 0518  NA 132* 130* 131*  K 4.4 4.2 4.5  CL 89* 89* 88*  CO2 22 18* 19  BUN 85* 100* 109*  CREATININE 9.54* 9.93* 10.74*  CALCIUM 7.4* 6.8* 6.7*  PHOS  --  8.5* 8.3*  GLUCOSE 116* 157* 132*     CBG (last 3)  No results for input(s): GLUCAP in the last 72 hours.  Scheduled Meds: . amLODipine  10 mg Oral Daily  . bisacodyl  10 mg Rectal Daily  . carvedilol  3.125 mg Oral BID WC  . dexamethasone  4 mg Oral Q12H  . docusate sodium  200 mg Oral BID  . fentaNYL      . fluticasone  1 puff Inhalation BID  . lidocaine      . midazolam      . pantoprazole  40 mg Oral Daily  . pravastatin  80 mg Oral q1800  . sodium chloride  3 mL Intravenous Q12H    Continuous Infusions:   Past Medical History  Diagnosis Date  . High cholesterol   . Depression   . GERD (gastroesophageal reflux disease)   . Hypertension   . Arthritis     "slight; in my hands" (04/18/2014)  . Anxiety   . Renal insufficiency   . Peripheral neuropathy     "fingers go to sleep"  . Dementia     "mild"  . Skin cancer of face     "I believe they burned them off"  . Prostate cancer     Past Surgical History  Procedure Laterality Date  . Tonsillectomy  1920's  . Cataract extraction, bilateral Bilateral 2000's  . Insertion prostate radiation  seed  04/2001  . Cardiac catheterization  1985's    Kallie Locks, MS, RD, LDN Pager # [phone] After hours/ weekend pager # [phone]

## 2014-04-21 NOTE — Sedation Documentation (Signed)
Sample obtained by Dr. Annamaria Boots and given to cytology lab technician.

## 2014-04-21 NOTE — Clinical Social Work Note (Signed)
CSW conferred with physical therapist Mickel Baas regarding Mr. Miguel Compton as consult received for SNF placement. Per Mickel Baas patient did better today than at evaluation. Patient lives at home with his wife and they are able to care for themselves at home. Patient is appropriate to go home with [facility] services per Mickel Baas. CSW will continue to monitor patient's progress through discharge.  Mohan Erven Givens, MSW, [place] [phone]

## 2014-04-22 LAB — RENAL FUNCTION PANEL
ANION GAP: 26 — AB (ref 5–15)
Albumin: 1.8 g/dL — ABNORMAL LOW (ref 3.5–5.2)
BUN: 125 mg/dL — AB (ref 6–23)
CALCIUM: 6.6 mg/dL — AB (ref 8.4–10.5)
CHLORIDE: 91 meq/L — AB (ref 96–112)
CO2: 16 mEq/L — ABNORMAL LOW (ref 19–32)
CREATININE: 10.94 mg/dL — AB (ref 0.50–1.35)
GFR, EST AFRICAN AMERICAN: 4 mL/min — AB (ref >90)
GFR, EST NON AFRICAN AMERICAN: 4 mL/min — AB (ref >90)
Glucose, Bld: 117 mg/dL — ABNORMAL HIGH (ref 70–99)
PHOSPHORUS: 8.7 mg/dL — AB (ref 2.3–4.6)
Potassium: 4.7 mEq/L (ref 3.7–5.3)
Sodium: 133 mEq/L — ABNORMAL LOW (ref 137–147)

## 2014-04-22 LAB — CBC
HCT: 26.5 % — ABNORMAL LOW (ref 39.0–52.0)
HEMOGLOBIN: 9.2 g/dL — AB (ref 13.0–17.0)
MCH: 29.8 pg (ref 26.0–34.0)
MCHC: 34.7 g/dL (ref 30.0–36.0)
MCV: 85.8 fL (ref 78.0–100.0)
PLATELETS: 280 10*3/uL (ref 150–400)
RBC: 3.09 MIL/uL — AB (ref 4.22–5.81)
RDW: 13 % (ref 11.5–15.5)
WBC: 18.3 10*3/uL — ABNORMAL HIGH (ref 4.0–10.5)

## 2014-04-22 LAB — CREATININE, SERUM
CREATININE: 10.3 mg/dL — AB (ref 0.50–1.35)
GFR calc Af Amer: 4 mL/min — ABNORMAL LOW (ref >90)
GFR, EST NON AFRICAN AMERICAN: 4 mL/min — AB (ref >90)

## 2014-04-22 LAB — HEPATITIS B SURFACE ANTIBODY,QUALITATIVE: Hep B S Ab: NEGATIVE

## 2014-04-22 LAB — CK: Total CK: 26 U/L (ref 7–232)

## 2014-04-22 LAB — HEPATITIS B CORE ANTIBODY, TOTAL: HEP B C TOTAL AB: NONREACTIVE

## 2014-04-22 LAB — HEPATITIS B SURFACE ANTIGEN: Hepatitis B Surface Ag: NEGATIVE

## 2014-04-22 MED ORDER — ONDANSETRON HCL 8 MG PO TABS
8.0000 mg | ORAL_TABLET | Freq: Once | ORAL | Status: AC
  Administered 2014-04-22: 8 mg via ORAL
  Filled 2014-04-22: qty 1

## 2014-04-22 MED ORDER — BORTEZOMIB CHEMO SQ INJECTION 3.5 MG (2.5MG/ML)
1.3000 mg/m2 | Freq: Once | INTRAMUSCULAR | Status: AC
  Administered 2014-04-22: 2.75 mg via SUBCUTANEOUS
  Filled 2014-04-22: qty 1.1

## 2014-04-22 MED ORDER — SODIUM CHLORIDE 0.9 % IV SOLN: 100.0000 mL | INTRAVENOUS | Status: DC | PRN

## 2014-04-22 MED ORDER — ALTEPLASE 2 MG IJ SOLR: 2.0000 mg | Freq: Once | INTRAMUSCULAR | Status: DC | PRN

## 2014-04-22 MED ORDER — HEPARIN SODIUM (PORCINE) 1000 UNIT/ML DIALYSIS: 1000.0000 [IU] | INTRAMUSCULAR | Status: DC | PRN

## 2014-04-22 NOTE — Progress Notes (Signed)
Chemotherapy (Velcade) verified with Baker Pierini RN.

## 2014-04-22 NOTE — Procedures (Signed)
I was present at this dialysis session. I have reviewed the session itself and made appropriate changes.   TDC.  Tolerating treatment  [name]  MD 04/22/2014, 10:15 AM

## 2014-04-22 NOTE — Progress Notes (Signed)
Admit: 04/18/2014 LOS: 4  68M, independent and full IADLs, with progressive AKI, +SPEP and [employer]. Concern for plasma cell related renal disorder. Renal Bx 12/9 prelim with no evidence of myeloma cast nephropathy.    Subjective:  Prelim path reviewed yesterday: 1. Cast nephropathy is not present. 2. Some suggestion perhaps of mild LCDD, but not sufficient to explain severity of renal failure 3. Relatively healthy appearing tubules / glomeruli; mild background scarring 4. No AIN, some deep red casts in medullary tubules; unclear etiology To rec bortezomib today Eating well, several unmeasured voids, no N/V/Fatigue Discussed results with pt, daughter, son-in-law   12/11 0701 - 12/12 0700 In: 3 [P.O.:480] Out: 200 [Urine:200]  Filed Weights   04/20/14 2016 04/21/14 2025  Weight: 84.823 kg (187 lb) 88.3 kg (194 lb 10.7 oz)    Scheduled Meds: . acyclovir  400 mg Oral BID  . amLODipine  10 mg Oral Daily  . bisacodyl  10 mg Rectal Daily  . bortezomib SQ  1.3 mg/m2 (Treatment Plan Actual) Subcutaneous Once  . carvedilol  3.125 mg Oral BID WC  . dexamethasone  4 mg Oral Q12H  . docusate sodium  200 mg Oral BID  . feeding supplement (NEPRO CARB STEADY)  237 mL Oral Q1500  . fluticasone  1 puff Inhalation BID  . ondansetron  8 mg Oral Once  . pantoprazole  40 mg Oral Daily  . pravastatin  80 mg Oral q1800  . sodium chloride  3 mL Intravenous Q12H   Continuous Infusions:  PRN Meds:.albuterol, ALPRAZolam, HYDROcodone-acetaminophen, prochlorperazine  Current Labs: reviewed    Physical Exam:  Blood pressure 131/87, pulse 70, temperature 97.6 F (36.4 C), temperature source Oral, resp. rate 18, weight 88.3 kg (194 lb 10.7 oz), SpO2 96 %. GEN: NAD, pleasant ENT: HOH, NCAT EYES: EOMI, arcus corenalis CV: RRR, no rub. Nl s1, s2 3/6 MSM at RUSB PULM: CTAB, nl crackles ABD: obese, nabs, s/nt/nd SKIN: no rashes/lesions PYP:PJKDT - 1+ LEE NEURO: nonfocal  A/P 1. AKI,  normal GFR 2-32months ago 1. Renal Bx and [facility] 04/19/14; prelim discussed with Miguel Compton: 1. Cast nephropathy is not present. 2. Some suggestion perhaps of mild LCDD, but not sufficient to explain severity of renal failure 3. Relatively healthy appearing tubules / glomeruli; mild background scarring 4. No AIN, some deep red casts in medullary tubules; unclear etiology 2. No role for TPE at this time 2. Plan for single HD today, given worsening azotemia and SCr 1. 3h, No heparin, No UF 3. Remain hopeful for renal recovery 4. Check CK with Tx today 2. + SPEP, sFLC; concern for Myeloma 1. Started decadron 04/17/14 -- contibuting to azotemia 2. Bortezomib to start today AFTER HD 3. Hx/o CVA 1. On ASA 81 for secondary prevention, held for renal biopsy 4. Hyperkalemia, resolved 1. Would not treat K < 6 2. Follow, no culprit medications 5. Hyponatremia, mild, asymptomatic; stable 1. Restrict 1.5L fluids 2. Caused by fluids in excess of ability to excrete water  [name] MD 04/22/2014, 9:29 AM   Recent Labs Lab 04/20/14 1105 04/21/14 0518 04/22/14 0417  NA 130* 131* 133*  K 4.2 4.5 4.7  CL 89* 88* 91*  CO2 18* 19 16*  GLUCOSE 157* 132* 117*  BUN 100* 109* 125*  CREATININE 9.93* 10.74* 10.94*  CALCIUM 6.8* 6.7* 6.6*  PHOS 8.5* 8.3* 8.7*    Recent Labs Lab 04/18/14 1311  04/20/14 1105 04/21/14 0500 04/22/14 0417  WBC 14.7*  < >  16.2* 15.1* 18.3*  NEUTROABS 13.4*  --   --   --   --   HGB 10.7*  < > 9.9* 9.1* 9.2*  HCT 31.7*  < > 28.2* 26.4* 26.5*  MCV 90.6  < > 88.7 86.0 85.8  PLT 291  < > 300 281 280  < > = values in this interval not displayed.

## 2014-04-22 NOTE — Progress Notes (Signed)
Chemotherapy administered without difficulty, patient tolerated well. Patient education completed with handouts given to patient and grand daughter. Wells Guiles RN given chemotherapy disposal bucket and gown with Chemotherapy sign for room.

## 2014-04-22 NOTE — Progress Notes (Addendum)
Patient Demographics  Miguel Compton, is a 80 y.o. male, DOB - [birth year], DTO:[id number]  Admit date - 04/18/2014   Admitting Physician Costin Karlyne Greenspan, MD  Outpatient Primary MD for the patient is [name], MD  LOS - 4   Chief Complaint  Patient presents with  . Abnormal Lab      Summary  Pleasant 80 year old highly functional male with history of COPD, hypertension, dyslipidemia and mild early dementia along with CVA in the past was admitted from PCP office for abnormal blood work. He has recently been diagnosed with renal failure, there was suspicion of multiple myeloma, this admission he has been seen by renal and hematology. He has undergone kidney biopsy and bone marrow aspiration. We are waiting for final pathology results. He is also underwent a temporary dialysis catheter placement in preparation of dialysis which most likely will be needed. He appears clinically stable.    Subjective:   Miguel Compton today has, No headache, No chest pain, No abdominal pain - No Nausea, No new weakness tingling or numbness, No Cough - SOB.    Assessment & Plan    1. Acute renal failure. 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 Renal following, post R IJ dialysis catheter placement , currently holding dialysis. Oncology and renal both following. Renal biopsy done and results pending.   2. SPEP concerning for an diagnosed multiple myeloma. Currently on Decadron. Oncology has been consulted per she and their input likely to be started on chemotherapy this admission.  IR to do bone marrow biopsy 04/21/2014 await results.   3. History of CVA. No acute issues.   4. Dyslipidemia. On statin continue.   5. Mild dementia. On Aricept.   6. Essential hypertension. On Norvasc dose increased  and Coreg added for better control.    7. Constipation. Initiated bowel regimen, + BM   8. Leukocytosis. Likely due to steroid use and possible multiple myeloma. No fever no signs of infection.      Code Status: Full  Family Communication: Son-in-law  Disposition Plan: PT eval. May require placement.   Procedures   Renal biopsy 04/19/2014 results pending Right IJ tunneled catheter for dialysis 04/19/2014 Bone marrow biopsy 04/21/2014 results pending Skeletal survey. Unremarkable  Consults  Renal, Hematology, IR   Medications  Scheduled Meds: . acyclovir  400 mg Oral BID  . amLODipine  10 mg Oral Daily  . bisacodyl  10 mg Rectal Daily  . bortezomib SQ  1.3 mg/m2 (Treatment Plan Actual) Subcutaneous Once  . carvedilol  3.125 mg Oral BID WC  . dexamethasone  4 mg Oral Q12H  . docusate sodium  200 mg Oral BID  . feeding supplement (NEPRO CARB STEADY)  237 mL Oral Q1500  . fluticasone  1 puff Inhalation BID  . ondansetron  8 mg Oral Once  . pantoprazole  40 mg Oral Daily  . pravastatin  80 mg Oral q1800  . sodium chloride  3 mL Intravenous Q12H   Continuous Infusions:  PRN Meds:.albuterol, ALPRAZolam, HYDROcodone-acetaminophen, prochlorperazine  DVT Prophylaxis    Heparin   Lab Results  Component Value Date   PLT 280 04/22/2014    Antibiotics     Anti-infectives    Start     Dose/Rate  Route Frequency Ordered Stop   04/21/14 2200  acyclovir (ZOVIRAX) 200 MG capsule 400 mg     400 mg Oral 2 times daily 04/21/14 1658     04/21/14 0000  acyclovir (ZOVIRAX) 400 MG tablet     400 mg Oral 2 times daily 04/21/14 1659     04/19/14 0930  ceFAZolin (ANCEF) IVPB 2 g/50 mL premix    Comments:  Hang ON CALL to xray 12/9   2 g100 mL/hr over 30 Minutes Intravenous On call 04/19/14 0917 04/19/14 1133          Objective:   Filed Vitals:   04/21/14 1742 04/21/14 2025 04/22/14 0459 04/22/14 0812  BP: 129/53 143/62 124/54 131/87  Pulse: 72 76 66 70  Temp: 97.8 F  (36.6 C) 98 F (36.7 C) 98.4 F (36.9 C) 97.6 F (36.4 C)  TempSrc: Oral Oral Oral Oral  Resp: $Remo'18 18 18 18  'DdFpd$ Weight:  88.3 kg (194 lb 10.7 oz)    SpO2: 96% 97% 100% 96%    Wt Readings from Last 3 Encounters:  04/21/14 88.3 kg (194 lb 10.7 oz)  03/31/14 84.823 kg (187 lb)  02/28/08 98.944 kg (218 lb 2.1 oz)     Intake/Output Summary (Last 24 hours) at 04/22/14 0908 Last data filed at 04/22/14 0500  Gross per 24 hour  Intake    480 ml  Output    200 ml  Net    280 ml     Physical Exam  Awake Alert, Oriented X 3, No new F.N deficits, Normal affect [place].AT,PERRAL Supple Neck,No JVD, No cervical lymphadenopathy appriciated.  Symmetrical Chest wall movement, Good air movement bilaterally, CTAB, right IJ tunnel catheter for dialysis RRR,No Gallops,Rubs or new Murmurs, No Parasternal Heave +ve B.Sounds, Abd Soft, No tenderness, No organomegaly appriciated, No rebound - guarding or rigidity. No Cyanosis, Clubbing or edema, No new Rash or bruise      Data Review   Micro Results No results found for this or any previous visit (from the past 240 hour(s)).  Radiology Reports Dg Chest 2 View  04/18/2014   CLINICAL DATA:  ABNORMAL LAB, shortness of breath, COPD  EXAM: CHEST - 2 VIEW  COMPARISON:  08/25/2011  FINDINGS: Coarse bilateral interstitial opacities left greater than right, with relative sparing of the apices, increased since previous exam. Heart size is normal. No effusion. Visualized skeletal structures are unremarkable.  IMPRESSION: 1. Progressive coarse bilateral asymmetric interstitial opacities, probably largely chronic.   Electronically Signed   By: Arne Cleveland M.D.   On: 04/18/2014 15:36   Ir Fluoro Guide Cv Line Right  04/19/2014   CLINICAL DATA:  Acute renal insufficiency, needs access for hemodialysis  EXAM: TUNNELED HEMODIALYSIS CATHETER PLACEMENT WITH ULTRASOUND AND FLUOROSCOPIC GUIDANCE  TECHNIQUE: The procedure, risks, benefits, and alternatives were explained  to the patient. Questions regarding the procedure were encouraged and answered. The patient understands and consents to the procedure. As antibiotic prophylaxis, cefazolin 2 g was ordered pre-procedure and administered intravenously within one hour of incision.Patency of the right IJ vein was confirmed with ultrasound with image documentation. An appropriate skin site was determined. Region was prepped using maximum barrier technique including cap and mask, sterile gown, sterile gloves, large sterile sheet, and Chlorhexidine as cutaneous antisepsis. The region was infiltrated locally with 1% lidocaine.  Intravenous Fentanyl and Versed were administered as conscious sedation during continuous cardiorespiratory monitoring by the radiology RN, with a total moderate sedation time of 25 minutes.  Under real-time ultrasound  guidance, the right IJ vein was accessed with a 21 gauge micropuncture needle; the needle tip within the vein was confirmed with ultrasound image documentation. Needle exchanged over the 018 guidewire for transitional dilator, which allowed advancement of a Benson wire into the IVC. Over this, an MPA catheter was advanced. A Hemosplit 19 hemodialysis catheter was tunneled from the right anterior chest wall approach to the right IJ dermatotomy site. The MPA catheter was exchanged over an Amplatz wire for serial vascular dilators which allow placement of a peel-away sheath, through which the catheter was advanced under intermittent fluoroscopy, positioned with its tips in the proximal and midright atrium. Spot chest radiograph confirms good catheter position. No pneumothorax. Catheter was flushed and primed per protocol. Catheter secured externally with O Prolene sutures. The right IJ dermatotomy site was closed with Dermabond.  COMPLICATIONS: COMPLICATIONS None immediate  FLUOROSCOPY TIME:  48 seconds  COMPARISON:  None  IMPRESSION: 1. Technically successful placement of tunneled right IJ hemodialysis  catheter with ultrasound and fluoroscopic guidance. Ready for routine use.  ACCESS: Remains approachable for percutaneous intervention as needed.   Electronically Signed   By: Arne Cleveland M.D.   On: 04/19/2014 12:10   Ir US Guide Vasc Access Right  04/19/2014   CLINICAL DATA:  Acute renal insufficiency, needs access for hemodialysis  EXAM: TUNNELED HEMODIALYSIS CATHETER PLACEMENT WITH ULTRASOUND AND FLUOROSCOPIC GUIDANCE  TECHNIQUE: The procedure, risks, benefits, and alternatives were explained to the patient. Questions regarding the procedure were encouraged and answered. The patient understands and consents to the procedure. As antibiotic prophylaxis, cefazolin 2 g was ordered pre-procedure and administered intravenously within one hour of incision.Patency of the right IJ vein was confirmed with ultrasound with image documentation. An appropriate skin site was determined. Region was prepped using maximum barrier technique including cap and mask, sterile gown, sterile gloves, large sterile sheet, and Chlorhexidine as cutaneous antisepsis. The region was infiltrated locally with 1% lidocaine.  Intravenous Fentanyl and Versed were administered as conscious sedation during continuous cardiorespiratory monitoring by the radiology RN, with a total moderate sedation time of 25 minutes.  Under real-time ultrasound guidance, the right IJ vein was accessed with a 21 gauge micropuncture needle; the needle tip within the vein was confirmed with ultrasound image documentation. Needle exchanged over the 018 guidewire for transitional dilator, which allowed advancement of a Benson wire into the IVC. Over this, an MPA catheter was advanced. A Hemosplit 19 hemodialysis catheter was tunneled from the right anterior chest wall approach to the right IJ dermatotomy site. The MPA catheter was exchanged over an Amplatz wire for serial vascular dilators which allow placement of a peel-away sheath, through which the catheter was  advanced under intermittent fluoroscopy, positioned with its tips in the proximal and midright atrium. Spot chest radiograph confirms good catheter position. No pneumothorax. Catheter was flushed and primed per protocol. Catheter secured externally with O Prolene sutures. The right IJ dermatotomy site was closed with Dermabond.  COMPLICATIONS: COMPLICATIONS None immediate  FLUOROSCOPY TIME:  48 seconds  COMPARISON:  None  IMPRESSION: 1. Technically successful placement of tunneled right IJ hemodialysis catheter with ultrasound and fluoroscopic guidance. Ready for routine use.  ACCESS: Remains approachable for percutaneous intervention as needed.   Electronically Signed   By: Arne Cleveland M.D.   On: 04/19/2014 12:10   Ir US Guide Bx Asp/drain  04/19/2014   CLINICAL DATA:  Acute renal insufficiency  COMPARISON:  CT 03/21/2008  TECHNIQUE: ULTRASOUND GUIDED RENAL CORE BIOPSY  Survey ultrasound was  performed and an appropriate skin entry site was localized. Site was marked, prepped with Betadine, draped in usual sterile fashion, infiltrated locally with 1% lidocaine. Intravenous Fentanyl and Versed were administered as conscious sedation during continuous cardiorespiratory monitoring by the radiology RN with a total moderate sedation time of 25 minutes.  Under real time ultrasound guidance, a 15 gauge trocar needle was advanced to the margin of the lower pole left kidney. The 16 gauge core needle was coaxially advanced for 3 passes. The core samples were submitted to pathology. The patient tolerated procedure well.  COMPLICATIONS: COMPLICATIONS none  IMPRESSION: 1. Technically successful ultrasound-guided core renal biopsy , lower pole left kidney.   Electronically Signed   By: Arne Cleveland M.D.   On: 04/19/2014 12:12   Ct Biopsy  04/21/2014   CLINICAL DATA:  Multiple myeloma  EXAM: CT GUIDED RIGHT ILIAC BONE MARROW ASPIRATION AND CORE BIOPSY  Date:  12/11/201512/03/2014 10:04 am  Radiologist:  M. Daryll Brod, MD  Guidance:  CT  FLUOROSCOPY TIME:  NONE.  MEDICATIONS AND MEDICAL HISTORY: 1 mg Versed, 50 mcg fentanyl  ANESTHESIA/SEDATION: 15 min  CONTRAST:  None.  COMPLICATIONS: None immediate  PROCEDURE: Informed consent was obtained from the patient following explanation of the procedure, risks, benefits and alternatives. The patient understands, agrees and consents for the procedure. All questions were addressed. A time out was performed.  The patient was positioned prone and noncontrast localization CT was performed of the pelvis to demonstrate the iliac marrow spaces.  Maximal barrier sterile technique utilized including caps, mask, sterile gowns, sterile gloves, large sterile drape, hand hygiene, and betadine prep.  Under sterile conditions and local anesthesia, an 11 gauge coaxial bone biopsy needle was advanced into the right iliac marrow space. Needle position was confirmed with CT imaging. Initially, bone marrow aspiration was performed. Next, the 11 gauge outer cannula was utilized to obtain a right iliac bone marrow core biopsy. Needle was removed. Hemostasis was obtained with compression. The patient tolerated the procedure well. Samples were prepared with the cytotechnologist. No immediate complications.  IMPRESSION: CT guided right iliac bone marrow aspiration and core biopsy.   Electronically Signed   By: Daryll Brod M.D.   On: 04/21/2014 10:13   Dg Bone Survey Met  04/20/2014   CLINICAL DATA:  80 year old male with multiple myeloma.  EXAM: METASTATIC BONE SURVEY  COMPARISON:  Recent prior osseous survey 04/13/2014  FINDINGS: Chest: No discrete lytic lesion in the visualized bones. Right IJ approach tunneled hemodialysis/pheresis catheter. The catheter tips project over the mid SVC. Mild cardiomegaly. Moderately large hiatal hernia. Atherosclerotic calcification noted in the transverse aorta. Stable appearance of left lingular opacity, background bronchitic changes and interstitial prominence.  No new acute cardiopulmonary finding.  Spine: Limited visualization of the cervical spine beyond the level of C6 secondary to overlap of the shoulder girdles. Multilevel cervical spondylosis. Right worse than left facet arthropathy. No discrete lytic osseous lesion. Atherosclerotic calcification present in the region of the left carotid bifurcation. Multilevel degenerative spurring throughout the thoracic spine. No evidence of thoracic compression fracture. Degenerative spurring continues to the lumbar spine. There is moderate facet arthropathy at L4-L5 and L5-S1. No discrete lytic lesion.  Skull: No discrete osseous lesion. Normal aeration of the paranasal sinuses.  Upper extremities: No discrete lytic osseous lesion. No acute fracture or malalignment. Nonunion of remote healed fracture involving the right ulnar styloid.  Pelvis: No discrete lytic osseous lesion. The bones are mildly osteopenic. Brachy therapy seeds project over the  prostate gland. Mild bilateral hip joint osteoarthritis.  Lower extremities: No discrete lytic lesion. Mild scattered atherosclerotic vascular calcifications.  IMPRESSION: No discrete lytic lesion to suggest plasmacytoma or focal multiple myeloma.  Additional ancillary findings as above.   Electronically Signed   By: Jacqulynn Cadet M.D.   On: 04/20/2014 10:50     CBC  Recent Labs Lab 04/18/14 1311 04/19/14 0538 04/20/14 1105 04/21/14 0500 04/22/14 0417  WBC 14.7* 15.9* 16.2* 15.1* 18.3*  HGB 10.7* 9.5* 9.9* 9.1* 9.2*  HCT 31.7* 27.7* 28.2* 26.4* 26.5*  PLT 291 279 300 281 280  MCV 90.6 87.4 88.7 86.0 85.8  MCH 30.6 30.0 31.1 29.6 29.8  MCHC 33.8 34.3 35.1 34.5 34.7  RDW 12.9 12.8 12.9 12.9 13.0  LYMPHSABS 1.0  --   --   --   --   MONOABS 0.3  --   --   --   --   EOSABS 0.0  --   --   --   --   BASOSABS 0.0  --   --   --   --     Chemistries   Recent Labs Lab 04/18/14 1311 04/19/14 0538 04/20/14 1105 04/21/14 0518 04/22/14 0417  NA 129* 132* 130*  131* 133*  K 5.5* 4.4 4.2 4.5 4.7  CL 87* 89* 89* 88* 91*  CO2 21 22 18* 19 16*  GLUCOSE 159* 116* 157* 132* 117*  BUN 75* 85* 100* 109* 125*  CREATININE 9.24* 9.54* 9.93* 10.74* 10.94*  CALCIUM 8.1* 7.4* 6.8* 6.7* 6.6*  AST 22 21  --   --   --   ALT 13 12  --   --   --   ALKPHOS 72 61  --   --   --   BILITOT 0.3 0.3  --   --   --    ------------------------------------------------------------------------------------------------------------------ CrCl cannot be calculated (Unknown ideal weight.). ------------------------------------------------------------------------------------------------------------------ No results for input(s): HGBA1C in the last 72 hours. ------------------------------------------------------------------------------------------------------------------ No results for input(s): CHOL, HDL, LDLCALC, TRIG, CHOLHDL, LDLDIRECT in the last 72 hours. ------------------------------------------------------------------------------------------------------------------ No results for input(s): TSH, T4TOTAL, T3FREE, THYROIDAB in the last 72 hours.  Invalid input(s): FREET3 ------------------------------------------------------------------------------------------------------------------ No results for input(s): VITAMINB12, FOLATE, FERRITIN, TIBC, IRON, RETICCTPCT in the last 72 hours.  Coagulation profile  Recent Labs Lab 04/18/14 1730 04/19/14 0538  INR 1.17 1.19    No results for input(s): DDIMER in the last 72 hours.  Cardiac Enzymes No results for input(s): CKMB, TROPONINI, MYOGLOBIN in the last 168 hours.  Invalid input(s): CK ------------------------------------------------------------------------------------------------------------------ Invalid input(s): POCBNP     Time Spent in minutes   35   SINGH,PRASHANT K M.D on 04/22/2014 at 9:08 AM  Between 7am to 7pm - Pager - [phone]  After 7pm go to [email or web address] - password [email or web address]  And look for the  night coverage person covering for me after hours  [facility] Office  [phone]

## 2014-04-22 NOTE — Progress Notes (Signed)
Pt tolerated first HD tx. Alert, no c/o, vss. Education materials given to patient and family. Report given.

## 2014-04-23 DIAGNOSIS — N178 Other acute kidney failure: Secondary | ICD-10-CM

## 2014-04-23 DIAGNOSIS — F0390 Unspecified dementia without behavioral disturbance: Secondary | ICD-10-CM

## 2014-04-23 LAB — RENAL FUNCTION PANEL
ANION GAP: 17 — AB (ref 5–15)
Albumin: 1.7 g/dL — ABNORMAL LOW (ref 3.5–5.2)
BUN: 81 mg/dL — AB (ref 6–23)
CO2: 21 mEq/L (ref 19–32)
Calcium: 7 mg/dL — ABNORMAL LOW (ref 8.4–10.5)
Chloride: 93 mEq/L — ABNORMAL LOW (ref 96–112)
Creatinine, Ser: 6.84 mg/dL — ABNORMAL HIGH (ref 0.50–1.35)
GFR calc non Af Amer: 6 mL/min — ABNORMAL LOW (ref >90)
GFR, EST AFRICAN AMERICAN: 7 mL/min — AB (ref >90)
GLUCOSE: 103 mg/dL — AB (ref 70–99)
PHOSPHORUS: 6.1 mg/dL — AB (ref 2.3–4.6)
POTASSIUM: 4.3 meq/L (ref 3.7–5.3)
Sodium: 131 mEq/L — ABNORMAL LOW (ref 137–147)

## 2014-04-23 LAB — CBC
HEMATOCRIT: 27 % — AB (ref 39.0–52.0)
Hemoglobin: 9.5 g/dL — ABNORMAL LOW (ref 13.0–17.0)
MCH: 30.4 pg (ref 26.0–34.0)
MCHC: 35.2 g/dL (ref 30.0–36.0)
MCV: 86.3 fL (ref 78.0–100.0)
Platelets: 275 10*3/uL (ref 150–400)
RBC: 3.13 MIL/uL — ABNORMAL LOW (ref 4.22–5.81)
RDW: 13.1 % (ref 11.5–15.5)
WBC: 18.4 10*3/uL — ABNORMAL HIGH (ref 4.0–10.5)

## 2014-04-23 LAB — CK: Total CK: 16 U/L (ref 7–232)

## 2014-04-23 MED ORDER — SENNA 8.6 MG PO TABS
2.0000 | ORAL_TABLET | Freq: Every day | ORAL | Status: DC
  Administered 2014-04-23: 17.2 mg via ORAL
  Filled 2014-04-23 (×2): qty 2

## 2014-04-23 MED ORDER — LACTULOSE 10 GM/15ML PO SOLN
30.0000 g | Freq: Two times a day (BID) | ORAL | Status: DC | PRN
  Administered 2014-04-24: 30 g via ORAL
  Filled 2014-04-23 (×3): qty 45

## 2014-04-23 MED ORDER — HEPARIN SODIUM (PORCINE) 5000 UNIT/ML IJ SOLN
5000.0000 [IU] | Freq: Three times a day (TID) | INTRAMUSCULAR | Status: DC
  Administered 2014-04-23 – 2014-05-04 (×32): 5000 [IU] via SUBCUTANEOUS
  Filled 2014-04-23 (×35): qty 1

## 2014-04-23 MED ORDER — POLYETHYLENE GLYCOL 3350 17 G PO PACK
17.0000 g | PACK | Freq: Two times a day (BID) | ORAL | Status: DC
  Administered 2014-04-23 – 2014-05-04 (×20): 17 g via ORAL
  Filled 2014-04-23 (×23): qty 1

## 2014-04-23 MED ORDER — ASPIRIN 81 MG PO CHEW
81.0000 mg | CHEWABLE_TABLET | Freq: Every day | ORAL | Status: DC
  Administered 2014-04-23 – 2014-05-04 (×12): 81 mg via ORAL
  Filled 2014-04-23 (×11): qty 1

## 2014-04-23 NOTE — Progress Notes (Signed)
Admit: 04/18/2014 LOS: 5  35M, independent and full IADLs, with progressive AKI, +SPEP and [employer]. Concern for plasma cell related renal disorder. Renal Bx 12/9 prelim with no evidence of myeloma cast nephropathy.    Subjective:  HD #1 yesterday, tolerated well. TDC w/o problems Minimal UF pulled COnt to have unmeasured voids Rec bortezomib yesterday, cont on decadron   12/12 0701 - 12/13 0700 In: -  Out: -500   Filed Weights   04/22/14 1010 04/22/14 1312 04/22/14 2117  Weight: 84.9 kg (187 lb 2.7 oz) 85.2 kg (187 lb 13.3 oz) 86.5 kg (190 lb 11.2 oz)    Scheduled Meds: . acyclovir  400 mg Oral BID  . amLODipine  10 mg Oral Daily  . bisacodyl  10 mg Rectal Daily  . carvedilol  3.125 mg Oral BID WC  . dexamethasone  4 mg Oral Q12H  . docusate sodium  200 mg Oral BID  . feeding supplement (NEPRO CARB STEADY)  237 mL Oral Q1500  . fluticasone  1 puff Inhalation BID  . pantoprazole  40 mg Oral Daily  . pravastatin  80 mg Oral q1800  . sodium chloride  3 mL Intravenous Q12H   Continuous Infusions:  PRN Meds:.albuterol, ALPRAZolam, HYDROcodone-acetaminophen, prochlorperazine  Current Labs: reviewed    Physical Exam:  Blood pressure 120/48, pulse 65, temperature 98.9 F (37.2 C), temperature source Oral, resp. rate 19, height 5\' 10"  (1.778 m), weight 86.5 kg (190 lb 11.2 oz), SpO2 93 %. GEN: NAD, pleasant ENT: HOH, NCAT EYES: EOMI, arcus corenalis CV: RRR, no rub. Nl s1, s2 3/6 MSM at RUSB PULM: CTAB, nl crackles ABD: obese, nabs, s/nt/nd SKIN: no rashes/lesions FVC:BSWHQ - 1+ LEE NEURO: nonfocal  A/P 1. AKI, normal GFR 2-86months ago (12/2013 1.13 --> 2.13 03/27/14) 1. Renal Bx and [facility] 04/19/14; prelim discussed with [facility] Charles Jennette: 1. Cast nephropathy is not present. 2. Some suggestion perhaps of mild LCDD, but not sufficient to explain severity of renal failure 3. Relatively healthy appearing tubules / glomeruli; mild background scarring 4. No  AIN, some deep red casts in medullary tubules; unclear etiology 2. No role for TPE at this time 3. HD #1 04/22/14 2/2 progressive azotemia, not very uremic at that time 4. Remain hopeful for renal recovery 2. + SPEP, sFLC; concern for Myeloma 1. Started decadron 04/17/14 -- contibuting to azotemia 2. Bortezomib to start 04/22/14 3. Hx/o CVA 1. On ASA 81 for secondary prevention, held for renal biopsy 4. Hyperkalemia, resolved 1. Would not treat K < 6 2. Follow, no culprit medications 5. Hyponatremia, mild, asymptomatic; stable 1. Restrict 1.5L fluids 2. Caused by fluids in excess of ability to excrete water  [name] MD 04/23/2014, 9:55 AM   Recent Labs Lab 04/21/14 0518 04/22/14 0417 04/22/14 1027 04/23/14 0500  NA 131* 133*  --  131*  K 4.5 4.7  --  4.3  CL 88* 91*  --  93*  CO2 19 16*  --  21  GLUCOSE 132* 117*  --  103*  BUN 109* 125*  --  81*  CREATININE 10.74* 10.94* 10.30* 6.84*  CALCIUM 6.7* 6.6*  --  7.0*  PHOS 8.3* 8.7*  --  6.1*    Recent Labs Lab 04/18/14 1311  04/21/14 0500 04/22/14 0417 04/23/14 0500  WBC 14.7*  < > 15.1* 18.3* 18.4*  NEUTROABS 13.4*  --   --   --   --   HGB 10.7*  < > 9.1* 9.2* 9.5*  HCT 31.7*  < > 26.4* 26.5* 27.0*  MCV 90.6  < > 86.0 85.8 86.3  PLT 291  < > 281 280 275  < > = values in this interval not displayed.

## 2014-04-23 NOTE — Progress Notes (Addendum)
PATIENT DETAILS Name: Miguel Compton Age: 80 y.o. Sex: male Date of Birth: [birth year] Admit Date: 04/18/2014 Admitting Physician Costin Karlyne Greenspan, MD IWL:NLGX, Jenny Reichmann, MD  Subjective: No major issues overnight  Assessment/Plan: Principal Problem: QJJ:HERDEYCXK suspicion for myeloma kidney, however biopsy negative myeloma cast nephropathy.Etiology of renal failure unclear, underwent first hemodialysis yesterday. Nephrology hopeful for eventual return of renal function.   Active Problems: Multiple myeloma:Hem/Onc consulted, underwent bone marrow biopsy on 04/21/14, pending results. As been started on Decadron and Velcade.  History of CVA:Non focal exam, ASA held for renal bx-will resume  Dyslipidemia: On statin continue.  Mild dementia: On Aricept. Mental status at baseline.  Essential hypertension: BP controlled, continue Coreg and amlodipine.   Constipation: Start miralax,senokot.Prn Lactulose  Leukocytosis: Likely due to steroid use and possible multiple myeloma. No fever no signs of infection.Monitor off antibiotics-UA neg for UTI, CXR-+coarse infiltrates (chronic)  Disposition: Remain inpatient  Antibiotics:  See below  Anti-infectives    Start     Dose/Rate Route Frequency Ordered Stop   04/21/14 2200  acyclovir (ZOVIRAX) 200 MG capsule 400 mg     400 mg Oral 2 times daily 04/21/14 1658     04/21/14 0000  acyclovir (ZOVIRAX) 400 MG tablet     400 mg Oral 2 times daily 04/21/14 1659     04/19/14 0930  ceFAZolin (ANCEF) IVPB 2 g/50 mL premix    Comments:  Hang ON CALL to xray 12/9   2 g100 mL/hr over 30 Minutes Intravenous On call 04/19/14 0917 04/19/14 1133      DVT Prophylaxis: Prophylactic  Heparin  Code Status: Full code   Family Communication None at bedside  Procedures:  None  CONSULTS:  nephrology and hematology/oncology  Time spent 40 minutes-which includes 50% of the time with face-to-face with patient/ family and coordinating care  related to the above assessment and plan.  MEDICATIONS: Scheduled Meds: . acyclovir  400 mg Oral BID  . amLODipine  10 mg Oral Daily  . bisacodyl  10 mg Rectal Daily  . carvedilol  3.125 mg Oral BID WC  . dexamethasone  4 mg Oral Q12H  . docusate sodium  200 mg Oral BID  . feeding supplement (NEPRO CARB STEADY)  237 mL Oral Q1500  . fluticasone  1 puff Inhalation BID  . pantoprazole  40 mg Oral Daily  . pravastatin  80 mg Oral q1800  . sodium chloride  3 mL Intravenous Q12H   Continuous Infusions:  PRN Meds:.albuterol, ALPRAZolam, HYDROcodone-acetaminophen, prochlorperazine    PHYSICAL EXAM: Vital signs in last 24 hours: Filed Vitals:   04/22/14 1454 04/22/14 2103 04/22/14 2117 04/23/14 0611  BP:  122/56  120/48  Pulse:  66  65  Temp:  98.5 F (36.9 C)  98.9 F (37.2 C)  TempSrc:  Oral    Resp:  21  19  Height: $Remove'5\' 10"'JOCAqSu$  (1.778 m)     Weight:   86.5 kg (190 lb 11.2 oz)   SpO2:  99%  93%    Weight change: -3.4 kg (-7 lb 7.9 oz) Filed Weights   04/22/14 1010 04/22/14 1312 04/22/14 2117  Weight: 84.9 kg (187 lb 2.7 oz) 85.2 kg (187 lb 13.3 oz) 86.5 kg (190 lb 11.2 oz)   Body mass index is 27.36 kg/(m^2).   Gen Exam: Awake and alert with clear speech.   Neck: Supple, No JVD.   Chest: B/L Clear.   CVS: S1 S2 Regular, no murmurs.  Abdomen: soft, BS +, non tender, non distended.  Extremities: no edema, lower extremities warm to touch. Neurologic: Non Focal.   Skin: No Rash.   Wounds: N/A.    Intake/Output from previous day: No intake or output data in the 24 hours ending 04/23/14 1440   LAB RESULTS: CBC  Recent Labs Lab 04/18/14 1311 04/19/14 0538 04/20/14 1105 04/21/14 0500 04/22/14 0417 04/23/14 0500  WBC 14.7* 15.9* 16.2* 15.1* 18.3* 18.4*  HGB 10.7* 9.5* 9.9* 9.1* 9.2* 9.5*  HCT 31.7* 27.7* 28.2* 26.4* 26.5* 27.0*  PLT 291 279 300 281 280 275  MCV 90.6 87.4 88.7 86.0 85.8 86.3  MCH 30.6 30.0 31.1 29.6 29.8 30.4  MCHC 33.8 34.3 35.1 34.5 34.7 35.2    RDW 12.9 12.8 12.9 12.9 13.0 13.1  LYMPHSABS 1.0  --   --   --   --   --   MONOABS 0.3  --   --   --   --   --   EOSABS 0.0  --   --   --   --   --   BASOSABS 0.0  --   --   --   --   --     Chemistries   Recent Labs Lab 04/19/14 0538 04/20/14 1105 04/21/14 0518 04/22/14 0417 04/22/14 1027 04/23/14 0500  NA 132* 130* 131* 133*  --  131*  K 4.4 4.2 4.5 4.7  --  4.3  CL 89* 89* 88* 91*  --  93*  CO2 22 18* 19 16*  --  21  GLUCOSE 116* 157* 132* 117*  --  103*  BUN 85* 100* 109* 125*  --  81*  CREATININE 9.54* 9.93* 10.74* 10.94* 10.30* 6.84*  CALCIUM 7.4* 6.8* 6.7* 6.6*  --  7.0*    CBG: No results for input(s): GLUCAP in the last 168 hours.  GFR Estimated Creatinine Clearance: 7.4 mL/min (by C-G formula based on Cr of 6.84).  Coagulation profile  Recent Labs Lab 04/18/14 1730 04/19/14 0538  INR 1.17 1.19    Cardiac Enzymes No results for input(s): CKMB, TROPONINI, MYOGLOBIN in the last 168 hours.  Invalid input(s): CK  Invalid input(s): POCBNP No results for input(s): DDIMER in the last 72 hours. No results for input(s): HGBA1C in the last 72 hours. No results for input(s): CHOL, HDL, LDLCALC, TRIG, CHOLHDL, LDLDIRECT in the last 72 hours. No results for input(s): TSH, T4TOTAL, T3FREE, THYROIDAB in the last 72 hours.  Invalid input(s): FREET3 No results for input(s): VITAMINB12, FOLATE, FERRITIN, TIBC, IRON, RETICCTPCT in the last 72 hours. No results for input(s): LIPASE, AMYLASE in the last 72 hours.  Urine Studies No results for input(s): UHGB, CRYS in the last 72 hours.  Invalid input(s): UACOL, UAPR, USPG, UPH, UTP, UGL, UKET, UBIL, UNIT, UROB, ULEU, UEPI, UWBC, URBC, UBAC, CAST, UCOM, BILUA  MICROBIOLOGY: No results found for this or any previous visit (from the past 240 hour(s)).  RADIOLOGY STUDIES/RESULTS: Dg Chest 2 View  04/18/2014   CLINICAL DATA:  ABNORMAL LAB, shortness of breath, COPD  EXAM: CHEST - 2 VIEW  COMPARISON:  08/25/2011   FINDINGS: Coarse bilateral interstitial opacities left greater than right, with relative sparing of the apices, increased since previous exam. Heart size is normal. No effusion. Visualized skeletal structures are unremarkable.  IMPRESSION: 1. Progressive coarse bilateral asymmetric interstitial opacities, probably largely chronic.   Electronically Signed   By: Arne Cleveland M.D.   On: 04/18/2014 15:36   Ir Fluoro Guide Cv  Line Right  04/19/2014   CLINICAL DATA:  Acute renal insufficiency, needs access for hemodialysis  EXAM: TUNNELED HEMODIALYSIS CATHETER PLACEMENT WITH ULTRASOUND AND FLUOROSCOPIC GUIDANCE  TECHNIQUE: The procedure, risks, benefits, and alternatives were explained to the patient. Questions regarding the procedure were encouraged and answered. The patient understands and consents to the procedure. As antibiotic prophylaxis, cefazolin 2 g was ordered pre-procedure and administered intravenously within one hour of incision.Patency of the right IJ vein was confirmed with ultrasound with image documentation. An appropriate skin site was determined. Region was prepped using maximum barrier technique including cap and mask, sterile gown, sterile gloves, large sterile sheet, and Chlorhexidine as cutaneous antisepsis. The region was infiltrated locally with 1% lidocaine.  Intravenous Fentanyl and Versed were administered as conscious sedation during continuous cardiorespiratory monitoring by the radiology RN, with a total moderate sedation time of 25 minutes.  Under real-time ultrasound guidance, the right IJ vein was accessed with a 21 gauge micropuncture needle; the needle tip within the vein was confirmed with ultrasound image documentation. Needle exchanged over the 018 guidewire for transitional dilator, which allowed advancement of a Benson wire into the IVC. Over this, an MPA catheter was advanced. A Hemosplit 19 hemodialysis catheter was tunneled from the right anterior chest wall approach to  the right IJ dermatotomy site. The MPA catheter was exchanged over an Amplatz wire for serial vascular dilators which allow placement of a peel-away sheath, through which the catheter was advanced under intermittent fluoroscopy, positioned with its tips in the proximal and midright atrium. Spot chest radiograph confirms good catheter position. No pneumothorax. Catheter was flushed and primed per protocol. Catheter secured externally with O Prolene sutures. The right IJ dermatotomy site was closed with Dermabond.  COMPLICATIONS: COMPLICATIONS None immediate  FLUOROSCOPY TIME:  48 seconds  COMPARISON:  None  IMPRESSION: 1. Technically successful placement of tunneled right IJ hemodialysis catheter with ultrasound and fluoroscopic guidance. Ready for routine use.  ACCESS: Remains approachable for percutaneous intervention as needed.   Electronically Signed   By: Oley Balm M.D.   On: 04/19/2014 12:10   Ir US Guide Vasc Access Right  04/19/2014   CLINICAL DATA:  Acute renal insufficiency, needs access for hemodialysis  EXAM: TUNNELED HEMODIALYSIS CATHETER PLACEMENT WITH ULTRASOUND AND FLUOROSCOPIC GUIDANCE  TECHNIQUE: The procedure, risks, benefits, and alternatives were explained to the patient. Questions regarding the procedure were encouraged and answered. The patient understands and consents to the procedure. As antibiotic prophylaxis, cefazolin 2 g was ordered pre-procedure and administered intravenously within one hour of incision.Patency of the right IJ vein was confirmed with ultrasound with image documentation. An appropriate skin site was determined. Region was prepped using maximum barrier technique including cap and mask, sterile gown, sterile gloves, large sterile sheet, and Chlorhexidine as cutaneous antisepsis. The region was infiltrated locally with 1% lidocaine.  Intravenous Fentanyl and Versed were administered as conscious sedation during continuous cardiorespiratory monitoring by the  radiology RN, with a total moderate sedation time of 25 minutes.  Under real-time ultrasound guidance, the right IJ vein was accessed with a 21 gauge micropuncture needle; the needle tip within the vein was confirmed with ultrasound image documentation. Needle exchanged over the 018 guidewire for transitional dilator, which allowed advancement of a Benson wire into the IVC. Over this, an MPA catheter was advanced. A Hemosplit 19 hemodialysis catheter was tunneled from the right anterior chest wall approach to the right IJ dermatotomy site. The MPA catheter was exchanged over an Amplatz wire for serial vascular  dilators which allow placement of a peel-away sheath, through which the catheter was advanced under intermittent fluoroscopy, positioned with its tips in the proximal and midright atrium. Spot chest radiograph confirms good catheter position. No pneumothorax. Catheter was flushed and primed per protocol. Catheter secured externally with O Prolene sutures. The right IJ dermatotomy site was closed with Dermabond.  COMPLICATIONS: COMPLICATIONS None immediate  FLUOROSCOPY TIME:  48 seconds  COMPARISON:  None  IMPRESSION: 1. Technically successful placement of tunneled right IJ hemodialysis catheter with ultrasound and fluoroscopic guidance. Ready for routine use.  ACCESS: Remains approachable for percutaneous intervention as needed.   Electronically Signed   By: Arne Cleveland M.D.   On: 04/19/2014 12:10   Ir US Guide Bx Asp/drain  04/19/2014   CLINICAL DATA:  Acute renal insufficiency  COMPARISON:  CT 03/21/2008  TECHNIQUE: ULTRASOUND GUIDED RENAL CORE BIOPSY  Survey ultrasound was performed and an appropriate skin entry site was localized. Site was marked, prepped with Betadine, draped in usual sterile fashion, infiltrated locally with 1% lidocaine. Intravenous Fentanyl and Versed were administered as conscious sedation during continuous cardiorespiratory monitoring by the radiology RN with a total moderate  sedation time of 25 minutes.  Under real time ultrasound guidance, a 15 gauge trocar needle was advanced to the margin of the lower pole left kidney. The 16 gauge core needle was coaxially advanced for 3 passes. The core samples were submitted to pathology. The patient tolerated procedure well.  COMPLICATIONS: COMPLICATIONS none  IMPRESSION: 1. Technically successful ultrasound-guided core renal biopsy , lower pole left kidney.   Electronically Signed   By: Arne Cleveland M.D.   On: 04/19/2014 12:12   Ct Biopsy  04/21/2014   CLINICAL DATA:  Multiple myeloma  EXAM: CT GUIDED RIGHT ILIAC BONE MARROW ASPIRATION AND CORE BIOPSY  Date:  12/11/201512/03/2014 10:04 am  Radiologist:  M. Daryll Brod, MD  Guidance:  CT  FLUOROSCOPY TIME:  NONE.  MEDICATIONS AND MEDICAL HISTORY: 1 mg Versed, 50 mcg fentanyl  ANESTHESIA/SEDATION: 15 min  CONTRAST:  None.  COMPLICATIONS: None immediate  PROCEDURE: Informed consent was obtained from the patient following explanation of the procedure, risks, benefits and alternatives. The patient understands, agrees and consents for the procedure. All questions were addressed. A time out was performed.  The patient was positioned prone and noncontrast localization CT was performed of the pelvis to demonstrate the iliac marrow spaces.  Maximal barrier sterile technique utilized including caps, mask, sterile gowns, sterile gloves, large sterile drape, hand hygiene, and betadine prep.  Under sterile conditions and local anesthesia, an 11 gauge coaxial bone biopsy needle was advanced into the right iliac marrow space. Needle position was confirmed with CT imaging. Initially, bone marrow aspiration was performed. Next, the 11 gauge outer cannula was utilized to obtain a right iliac bone marrow core biopsy. Needle was removed. Hemostasis was obtained with compression. The patient tolerated the procedure well. Samples were prepared with the cytotechnologist. No immediate complications.  IMPRESSION:  CT guided right iliac bone marrow aspiration and core biopsy.   Electronically Signed   By: Daryll Brod M.D.   On: 04/21/2014 10:13   Dg Bone Survey Met  04/20/2014   CLINICAL DATA:  80 year old male with multiple myeloma.  EXAM: METASTATIC BONE SURVEY  COMPARISON:  Recent prior osseous survey 04/13/2014  FINDINGS: Chest: No discrete lytic lesion in the visualized bones. Right IJ approach tunneled hemodialysis/pheresis catheter. The catheter tips project over the mid SVC. Mild cardiomegaly. Moderately large hiatal hernia. Atherosclerotic calcification  noted in the transverse aorta. Stable appearance of left lingular opacity, background bronchitic changes and interstitial prominence. No new acute cardiopulmonary finding.  Spine: Limited visualization of the cervical spine beyond the level of C6 secondary to overlap of the shoulder girdles. Multilevel cervical spondylosis. Right worse than left facet arthropathy. No discrete lytic osseous lesion. Atherosclerotic calcification present in the region of the left carotid bifurcation. Multilevel degenerative spurring throughout the thoracic spine. No evidence of thoracic compression fracture. Degenerative spurring continues to the lumbar spine. There is moderate facet arthropathy at L4-L5 and L5-S1. No discrete lytic lesion.  Skull: No discrete osseous lesion. Normal aeration of the paranasal sinuses.  Upper extremities: No discrete lytic osseous lesion. No acute fracture or malalignment. Nonunion of remote healed fracture involving the right ulnar styloid.  Pelvis: No discrete lytic osseous lesion. The bones are mildly osteopenic. Brachy therapy seeds project over the prostate gland. Mild bilateral hip joint osteoarthritis.  Lower extremities: No discrete lytic lesion. Mild scattered atherosclerotic vascular calcifications.  IMPRESSION: No discrete lytic lesion to suggest plasmacytoma or focal multiple myeloma.  Additional ancillary findings as above.    Electronically Signed   By: Jacqulynn Cadet M.D.   On: 04/20/2014 10:50    Oren Binet, MD  [facility] Pager:[phone]  If 7PM-7AM, please contact night-coverage [email or web address] Password [email or web address] 04/23/2014, 2:40 PM   LOS: 5 days

## 2014-04-24 LAB — RENAL FUNCTION PANEL
Albumin: 1.6 g/dL — ABNORMAL LOW (ref 3.5–5.2)
Anion gap: 18 — ABNORMAL HIGH (ref 5–15)
BUN: 98 mg/dL — AB (ref 6–23)
CALCIUM: 7.1 mg/dL — AB (ref 8.4–10.5)
CHLORIDE: 95 meq/L — AB (ref 96–112)
CO2: 21 mEq/L (ref 19–32)
CREATININE: 7.12 mg/dL — AB (ref 0.50–1.35)
GFR calc Af Amer: 7 mL/min — ABNORMAL LOW (ref >90)
GFR calc non Af Amer: 6 mL/min — ABNORMAL LOW (ref >90)
Glucose, Bld: 123 mg/dL — ABNORMAL HIGH (ref 70–99)
Phosphorus: 7.3 mg/dL — ABNORMAL HIGH (ref 2.3–4.6)
Potassium: 4.9 mEq/L (ref 3.7–5.3)
Sodium: 134 mEq/L — ABNORMAL LOW (ref 137–147)

## 2014-04-24 LAB — CBC
HEMATOCRIT: 25.8 % — AB (ref 39.0–52.0)
Hemoglobin: 8.8 g/dL — ABNORMAL LOW (ref 13.0–17.0)
MCH: 29.5 pg (ref 26.0–34.0)
MCHC: 34.1 g/dL (ref 30.0–36.0)
MCV: 86.6 fL (ref 78.0–100.0)
Platelets: 250 10*3/uL (ref 150–400)
RBC: 2.98 MIL/uL — ABNORMAL LOW (ref 4.22–5.81)
RDW: 13.1 % (ref 11.5–15.5)
WBC: 17 10*3/uL — AB (ref 4.0–10.5)

## 2014-04-24 MED ORDER — AMLODIPINE BESYLATE 5 MG PO TABS
5.0000 mg | ORAL_TABLET | Freq: Every day | ORAL | Status: DC
  Administered 2014-04-24 – 2014-04-25 (×2): 5 mg via ORAL
  Filled 2014-04-24 (×3): qty 1

## 2014-04-24 MED ORDER — RENA-VITE PO TABS
1.0000 | ORAL_TABLET | Freq: Every day | ORAL | Status: DC
  Administered 2014-04-24 – 2014-05-03 (×10): 1 via ORAL
  Filled 2014-04-24 (×12): qty 1

## 2014-04-24 MED ORDER — BISACODYL 10 MG RE SUPP: 10.0000 mg | Freq: Every day | RECTAL | Status: DC | PRN

## 2014-04-24 MED ORDER — DARBEPOETIN ALFA 150 MCG/0.3ML IJ SOSY
150.0000 ug | PREFILLED_SYRINGE | INTRAMUSCULAR | Status: DC
  Filled 2014-04-24: qty 0.3

## 2014-04-24 MED ORDER — SENNA 8.6 MG PO TABS
2.0000 | ORAL_TABLET | Freq: Two times a day (BID) | ORAL | Status: DC
  Administered 2014-04-24 – 2014-05-04 (×18): 17.2 mg via ORAL
  Filled 2014-04-24 (×23): qty 2

## 2014-04-24 MED ORDER — LANTHANUM CARBONATE 500 MG PO CHEW
1000.0000 mg | CHEWABLE_TABLET | Freq: Three times a day (TID) | ORAL | Status: DC
  Administered 2014-04-24 – 2014-05-04 (×24): 1000 mg via ORAL
  Filled 2014-04-24 (×31): qty 2

## 2014-04-24 NOTE — Progress Notes (Signed)
Physical Therapy Treatment Patient Details Name: Miguel Compton MRN: [id number] DOB: November 14, 1921 Today's Date: 04/24/2014    History of Present Illness Pt is a 80 y.o. male with Past medical history of COPD, dementia, depression, CVA, overflow incontinence. The patient is presenting with complaints of an abnormal lab. He mentions that since last 2 months he has been having generalized malaise, leg swelling, and poor appetite with weight loss as throughout the year he has been busy taking care of his wife who has been hospitalized multiple times. He has noticed he has some right-sided flank pain ongoing since last one week. He also mentions that he has occasional episodes of constipation followed by episodes of bowel movements which are consistent only mucousy output for last a year or so.    PT Comments    Pt very fatigued during session however was motivated to participate, and demonstrated a good rehab effort. Pt improving with ambulation and was generally steady and safe with ambulation this session. Would continue to recommend that pt have supervision when ambulating if not using the RW.   Follow Up Recommendations  Supervision - Intermittent;Home health PT     Equipment Recommendations  None recommended by PT    Recommendations for Other Services       Precautions / Restrictions Precautions Precautions: Fall Restrictions Weight Bearing Restrictions: No    Mobility  Bed Mobility Overal bed mobility: Needs Assistance Bed Mobility: Supine to Sit;Sit to Supine     Supine to sit: Supervision Sit to supine: Supervision   General bed mobility comments: Pt was able to transition to/from EOB with no physical assistance.   Transfers Overall transfer level: Needs assistance Equipment used: None Transfers: Sit to/from Stand Sit to Stand: Supervision         General transfer comment: Pt was able to power-up to full standing without assistance. No unsteadiness or LOB noted.    Ambulation/Gait Ambulation/Gait assistance: Supervision Ambulation Distance (Feet): 480 Feet Assistive device: None Gait Pattern/deviations: Step-through pattern;Decreased stride length;Trendelenburg;Trunk flexed Gait velocity: Decreased Gait velocity interpretation: Below normal speed for age/gender General Gait Details: Pt was able to ambulate the length of the unit and back to his room with no AD. Pt did not demonstrate any LOB or unsteadiness. Supervision for safety.    Stairs            Wheelchair Mobility    Modified Rankin (Stroke Patients Only)       Balance Overall balance assessment: Needs assistance Sitting-balance support: Feet supported;No upper extremity supported Sitting balance-Leahy Scale: Fair     Standing balance support: Single extremity supported Standing balance-Leahy Scale: Fair                      Cognition Arousal/Alertness: Awake/alert Behavior During Therapy: WFL for tasks assessed/performed Overall Cognitive Status: Within Functional Limits for tasks assessed                      Exercises      General Comments        Pertinent Vitals/Pain Pain Assessment: No/denies pain    Home Living                      Prior Function            PT Goals (current goals can now be found in the care plan section) Acute Rehab PT Goals Patient Stated Goal: Return home to his wife of 3 years  PT Goal Formulation: With patient Time For Goal Achievement: 04/27/14 Potential to Achieve Goals: Good Progress towards PT goals: Progressing toward goals    Frequency  Min 3X/week    PT Plan Current plan remains appropriate    Co-evaluation             End of Session Equipment Utilized During Treatment: Gait belt Activity Tolerance: Patient tolerated treatment well Patient left: in bed;with call bell/phone within reach     Time: 0677-0340 PT Time Calculation (min) (ACUTE ONLY): 21 min  Charges:  $Gait  Training: 8-22 mins                    G Codes:      Rolinda Roan 2014/05/06, 5:16 PM  Rolinda Roan, PT, DPT Acute Rehabilitation Services Pager: [phone]

## 2014-04-24 NOTE — Progress Notes (Signed)
PATIENT DETAILS Name: Miguel Compton Age: 80 y.o. Sex: male Date of Birth: 21-Nov-1921 Admit Date: 04/18/2014 Admitting Physician Costin Karlyne Greenspan, MD ZOX:[id number], Jenny Reichmann, MD  Brief Narrative: 80 year old highly functional male with history of COPD, hypertension, dyslipidemia and mild early dementia along with CVA in the past was admitted from PCP office for worsening renal failure. Patient was recently diagnosed with multiple myeloma. Initially thought to have renal failure from multiple myeloma, however preliminary pathology from kidney biopsy negative for this. Also underwent bone marrow biopsy which is currently pending report. Continues to have significant renal dysfunction, being followed by both nephrology and oncology. Has been started on Decadron and Velcade by oncology. Nephrology following and patient has undergone hemodialysis on 04/22/14, current plans are to follow renal function with prn dialysis and close monitoring  Subjective: No major issues overnight-denies any major compaints this am-sleeping comfortably  Assessment/Plan: Principal Problem: AVW:[id number] suspicion for myeloma kidney, however biopsy negative myeloma cast nephropathy.Etiology of renal failure unclear, underwent first hemodialysis 12/12. Nephrology hopeful for eventual return of renal function, however may require further HD depending on renal function/output. Continue to monitor and closely follow.  Active Problems: Multiple myeloma:Hem/Onc consulted, underwent bone marrow biopsy on 04/21/14, pending results. As been started on Decadron and Velcade.Oncology following.  History of CVA:Non focal exam, ASA held for renal bx-has been resumed  Dyslipidemia: On statin -continue.  Mild dementia: On Aricept.Currently awake/alert X4  Essential hypertension: BP controlled, continue Coreg and amlodipine.   Constipation: Start miralax,senokot.Prn Lactulose. Small BM this am.  Leukocytosis: Likely due to  steroid use and possible multiple myeloma. No fever no signs of infection.Monitor off antibiotics-UA neg for UTI, CXR-+coarse infiltrates (chronic)  Disposition: Remain inpatient  Antibiotics:  See below  Anti-infectives    Start     Dose/Rate Route Frequency Ordered Stop   04/21/14 2200  acyclovir (ZOVIRAX) 200 MG capsule 400 mg     400 mg Oral 2 times daily 04/21/14 1658     04/21/14 0000  acyclovir (ZOVIRAX) 400 MG tablet     400 mg Oral 2 times daily 04/21/14 1659     04/19/14 0930  ceFAZolin (ANCEF) IVPB 2 g/50 mL premix    Comments:  Hang ON CALL to xray 12/9   2 g100 mL/hr over 30 Minutes Intravenous On call 04/19/14 0917 04/19/14 1133      DVT Prophylaxis: Prophylactic  Heparin  Code Status: Full code   Family Communication None at bedside  Procedures:  None  CONSULTS:  nephrology and hematology/oncology  Time spent 40 minutes-which includes 50% of the time with face-to-face with patient/ family and coordinating care related to the above assessment and plan.  MEDICATIONS: Scheduled Meds: . acyclovir  400 mg Oral BID  . amLODipine  5 mg Oral QHS  . aspirin  81 mg Oral Daily  . carvedilol  3.125 mg Oral BID WC  . [START ON 04/25/2014] darbepoetin (ARANESP) injection - DIALYSIS  150 mcg Intravenous Q Tue-HD  . dexamethasone  4 mg Oral Q12H  . docusate sodium  200 mg Oral BID  . feeding supplement (NEPRO CARB STEADY)  237 mL Oral Q1500  . fluticasone  1 puff Inhalation BID  . heparin subcutaneous  5,000 Units Subcutaneous 3 times per day  . lanthanum  1,000 mg Oral TID WC  . multivitamin  1 tablet Oral QHS  . pantoprazole  40 mg Oral Daily  . polyethylene glycol  17 g Oral BID  .  pravastatin  80 mg Oral q1800  . senna  2 tablet Oral QHS  . sodium chloride  3 mL Intravenous Q12H   Continuous Infusions:  PRN Meds:.albuterol, ALPRAZolam, bisacodyl, HYDROcodone-acetaminophen, lactulose, prochlorperazine    PHYSICAL EXAM: Vital signs in last 24  hours: Filed Vitals:   04/23/14 2109 04/24/14 0454 04/24/14 0749 04/24/14 0922  BP:  134/62 117/53   Pulse:  63 62   Temp:  98.2 F (36.8 C) 98.1 F (36.7 C)   TempSrc:   Oral   Resp:  18 18   Height:      Weight:      SpO2: 98% 94% 94% 96%    Weight change: 2.644 kg (5 lb 13.3 oz) Filed Weights   04/22/14 1312 04/22/14 2117 04/23/14 1954  Weight: 85.2 kg (187 lb 13.3 oz) 86.5 kg (190 lb 11.2 oz) 87.544 kg (193 lb)   Body mass index is 27.69 kg/(m^2).   Gen Exam: Awake and alert with clear speech.   Neck: Supple, No JVD.   Chest: B/L Clear.   CVS: S1 S2 Regular, no murmurs.  Abdomen: soft, BS +, non tender, non distended.  Extremities: no edema, lower extremities warm to touch. Neurologic: Non Focal.   Skin: No Rash.   Wounds: N/A.    Intake/Output from previous day:  Intake/Output Summary (Last 24 hours) at 04/24/14 1040 Last data filed at 04/24/14 1018  Gross per 24 hour  Intake    320 ml  Output    230 ml  Net     90 ml     LAB RESULTS: CBC  Recent Labs Lab 04/18/14 1311  04/20/14 1105 04/21/14 0500 04/22/14 0417 04/23/14 0500 04/24/14 0442  WBC 14.7*  < > 16.2* 15.1* 18.3* 18.4* 17.0*  HGB 10.7*  < > 9.9* 9.1* 9.2* 9.5* 8.8*  HCT 31.7*  < > 28.2* 26.4* 26.5* 27.0* 25.8*  PLT 291  < > 300 281 280 275 250  MCV 90.6  < > 88.7 86.0 85.8 86.3 86.6  MCH 30.6  < > 31.1 29.6 29.8 30.4 29.5  MCHC 33.8  < > 35.1 34.5 34.7 35.2 34.1  RDW 12.9  < > 12.9 12.9 13.0 13.1 13.1  LYMPHSABS 1.0  --   --   --   --   --   --   MONOABS 0.3  --   --   --   --   --   --   EOSABS 0.0  --   --   --   --   --   --   BASOSABS 0.0  --   --   --   --   --   --   < > = values in this interval not displayed.  Chemistries   Recent Labs Lab 04/20/14 1105 04/21/14 0518 04/22/14 0417 04/22/14 1027 04/23/14 0500 04/24/14 0442  NA 130* 131* 133*  --  131* 134*  K 4.2 4.5 4.7  --  4.3 4.9  CL 89* 88* 91*  --  93* 95*  CO2 18* 19 16*  --  21 21  GLUCOSE 157* 132* 117*   --  103* 123*  BUN 100* 109* 125*  --  81* 98*  CREATININE 9.93* 10.74* 10.94* 10.30* 6.84* 7.12*  CALCIUM 6.8* 6.7* 6.6*  --  7.0* 7.1*    CBG: No results for input(s): GLUCAP in the last 168 hours.  GFR Estimated Creatinine Clearance: 7.1 mL/min (by C-G formula based on Cr of  7.12).  Coagulation profile  Recent Labs Lab 04/18/14 1730 04/19/14 0538  INR 1.17 1.19    Cardiac Enzymes No results for input(s): CKMB, TROPONINI, MYOGLOBIN in the last 168 hours.  Invalid input(s): CK  Invalid input(s): POCBNP No results for input(s): DDIMER in the last 72 hours. No results for input(s): HGBA1C in the last 72 hours. No results for input(s): CHOL, HDL, LDLCALC, TRIG, CHOLHDL, LDLDIRECT in the last 72 hours. No results for input(s): TSH, T4TOTAL, T3FREE, THYROIDAB in the last 72 hours.  Invalid input(s): FREET3 No results for input(s): VITAMINB12, FOLATE, FERRITIN, TIBC, IRON, RETICCTPCT in the last 72 hours. No results for input(s): LIPASE, AMYLASE in the last 72 hours.  Urine Studies No results for input(s): UHGB, CRYS in the last 72 hours.  Invalid input(s): UACOL, UAPR, USPG, UPH, UTP, UGL, UKET, UBIL, UNIT, UROB, ULEU, UEPI, UWBC, URBC, UBAC, CAST, UCOM, BILUA  MICROBIOLOGY: No results found for this or any previous visit (from the past 240 hour(s)).  RADIOLOGY STUDIES/RESULTS: Dg Chest 2 View  04/18/2014   CLINICAL DATA:  ABNORMAL LAB, shortness of breath, COPD  EXAM: CHEST - 2 VIEW  COMPARISON:  08/25/2011  FINDINGS: Coarse bilateral interstitial opacities left greater than right, with relative sparing of the apices, increased since previous exam. Heart size is normal. No effusion. Visualized skeletal structures are unremarkable.  IMPRESSION: 1. Progressive coarse bilateral asymmetric interstitial opacities, probably largely chronic.   Electronically Signed   By: Arne Cleveland M.D.   On: 04/18/2014 15:36   Ir Fluoro Guide Cv Line Right  04/19/2014   CLINICAL DATA:   Acute renal insufficiency, needs access for hemodialysis  EXAM: TUNNELED HEMODIALYSIS CATHETER PLACEMENT WITH ULTRASOUND AND FLUOROSCOPIC GUIDANCE  TECHNIQUE: The procedure, risks, benefits, and alternatives were explained to the patient. Questions regarding the procedure were encouraged and answered. The patient understands and consents to the procedure. As antibiotic prophylaxis, cefazolin 2 g was ordered pre-procedure and administered intravenously within one hour of incision.Patency of the right IJ vein was confirmed with ultrasound with image documentation. An appropriate skin site was determined. Region was prepped using maximum barrier technique including cap and mask, sterile gown, sterile gloves, large sterile sheet, and Chlorhexidine as cutaneous antisepsis. The region was infiltrated locally with 1% lidocaine.  Intravenous Fentanyl and Versed were administered as conscious sedation during continuous cardiorespiratory monitoring by the radiology RN, with a total moderate sedation time of 25 minutes.  Under real-time ultrasound guidance, the right IJ vein was accessed with a 21 gauge micropuncture needle; the needle tip within the vein was confirmed with ultrasound image documentation. Needle exchanged over the 018 guidewire for transitional dilator, which allowed advancement of a Benson wire into the IVC. Over this, an MPA catheter was advanced. A Hemosplit 19 hemodialysis catheter was tunneled from the right anterior chest wall approach to the right IJ dermatotomy site. The MPA catheter was exchanged over an Amplatz wire for serial vascular dilators which allow placement of a peel-away sheath, through which the catheter was advanced under intermittent fluoroscopy, positioned with its tips in the proximal and midright atrium. Spot chest radiograph confirms good catheter position. No pneumothorax. Catheter was flushed and primed per protocol. Catheter secured externally with O Prolene sutures. The right IJ  dermatotomy site was closed with Dermabond.  COMPLICATIONS: COMPLICATIONS None immediate  FLUOROSCOPY TIME:  48 seconds  COMPARISON:  None  IMPRESSION: 1. Technically successful placement of tunneled right IJ hemodialysis catheter with ultrasound and fluoroscopic guidance. Ready for routine use.  ACCESS:  Remains approachable for percutaneous intervention as needed.   Electronically Signed   By: Arne Cleveland M.D.   On: 04/19/2014 12:10   Ir US Guide Vasc Access Right  04/19/2014   CLINICAL DATA:  Acute renal insufficiency, needs access for hemodialysis  EXAM: TUNNELED HEMODIALYSIS CATHETER PLACEMENT WITH ULTRASOUND AND FLUOROSCOPIC GUIDANCE  TECHNIQUE: The procedure, risks, benefits, and alternatives were explained to the patient. Questions regarding the procedure were encouraged and answered. The patient understands and consents to the procedure. As antibiotic prophylaxis, cefazolin 2 g was ordered pre-procedure and administered intravenously within one hour of incision.Patency of the right IJ vein was confirmed with ultrasound with image documentation. An appropriate skin site was determined. Region was prepped using maximum barrier technique including cap and mask, sterile gown, sterile gloves, large sterile sheet, and Chlorhexidine as cutaneous antisepsis. The region was infiltrated locally with 1% lidocaine.  Intravenous Fentanyl and Versed were administered as conscious sedation during continuous cardiorespiratory monitoring by the radiology RN, with a total moderate sedation time of 25 minutes.  Under real-time ultrasound guidance, the right IJ vein was accessed with a 21 gauge micropuncture needle; the needle tip within the vein was confirmed with ultrasound image documentation. Needle exchanged over the 018 guidewire for transitional dilator, which allowed advancement of a Benson wire into the IVC. Over this, an MPA catheter was advanced. A Hemosplit 19 hemodialysis catheter was tunneled from the right  anterior chest wall approach to the right IJ dermatotomy site. The MPA catheter was exchanged over an Amplatz wire for serial vascular dilators which allow placement of a peel-away sheath, through which the catheter was advanced under intermittent fluoroscopy, positioned with its tips in the proximal and midright atrium. Spot chest radiograph confirms good catheter position. No pneumothorax. Catheter was flushed and primed per protocol. Catheter secured externally with O Prolene sutures. The right IJ dermatotomy site was closed with Dermabond.  COMPLICATIONS: COMPLICATIONS None immediate  FLUOROSCOPY TIME:  48 seconds  COMPARISON:  None  IMPRESSION: 1. Technically successful placement of tunneled right IJ hemodialysis catheter with ultrasound and fluoroscopic guidance. Ready for routine use.  ACCESS: Remains approachable for percutaneous intervention as needed.   Electronically Signed   By: Arne Cleveland M.D.   On: 04/19/2014 12:10   Ir US Guide Bx Asp/drain  04/19/2014   CLINICAL DATA:  Acute renal insufficiency  COMPARISON:  CT 03/21/2008  TECHNIQUE: ULTRASOUND GUIDED RENAL CORE BIOPSY  Survey ultrasound was performed and an appropriate skin entry site was localized. Site was marked, prepped with Betadine, draped in usual sterile fashion, infiltrated locally with 1% lidocaine. Intravenous Fentanyl and Versed were administered as conscious sedation during continuous cardiorespiratory monitoring by the radiology RN with a total moderate sedation time of 25 minutes.  Under real time ultrasound guidance, a 15 gauge trocar needle was advanced to the margin of the lower pole left kidney. The 16 gauge core needle was coaxially advanced for 3 passes. The core samples were submitted to pathology. The patient tolerated procedure well.  COMPLICATIONS: COMPLICATIONS none  IMPRESSION: 1. Technically successful ultrasound-guided core renal biopsy , lower pole left kidney.   Electronically Signed   By: Arne Cleveland M.D.    On: 04/19/2014 12:12   Ct Biopsy  04/21/2014   CLINICAL DATA:  Multiple myeloma  EXAM: CT GUIDED RIGHT ILIAC BONE MARROW ASPIRATION AND CORE BIOPSY  Date:  12/11/201512/03/2014 10:04 am  Radiologist:  M. Daryll Brod, MD  Guidance:  CT  FLUOROSCOPY TIME:  NONE.  MEDICATIONS AND  MEDICAL HISTORY: 1 mg Versed, 50 mcg fentanyl  ANESTHESIA/SEDATION: 15 min  CONTRAST:  None.  COMPLICATIONS: None immediate  PROCEDURE: Informed consent was obtained from the patient following explanation of the procedure, risks, benefits and alternatives. The patient understands, agrees and consents for the procedure. All questions were addressed. A time out was performed.  The patient was positioned prone and noncontrast localization CT was performed of the pelvis to demonstrate the iliac marrow spaces.  Maximal barrier sterile technique utilized including caps, mask, sterile gowns, sterile gloves, large sterile drape, hand hygiene, and betadine prep.  Under sterile conditions and local anesthesia, an 11 gauge coaxial bone biopsy needle was advanced into the right iliac marrow space. Needle position was confirmed with CT imaging. Initially, bone marrow aspiration was performed. Next, the 11 gauge outer cannula was utilized to obtain a right iliac bone marrow core biopsy. Needle was removed. Hemostasis was obtained with compression. The patient tolerated the procedure well. Samples were prepared with the cytotechnologist. No immediate complications.  IMPRESSION: CT guided right iliac bone marrow aspiration and core biopsy.   Electronically Signed   By: Daryll Brod M.D.   On: 04/21/2014 10:13   Dg Bone Survey Met  04/20/2014   CLINICAL DATA:  80 year old male with multiple myeloma.  EXAM: METASTATIC BONE SURVEY  COMPARISON:  Recent prior osseous survey 04/13/2014  FINDINGS: Chest: No discrete lytic lesion in the visualized bones. Right IJ approach tunneled hemodialysis/pheresis catheter. The catheter tips project over the mid SVC.  Mild cardiomegaly. Moderately large hiatal hernia. Atherosclerotic calcification noted in the transverse aorta. Stable appearance of left lingular opacity, background bronchitic changes and interstitial prominence. No new acute cardiopulmonary finding.  Spine: Limited visualization of the cervical spine beyond the level of C6 secondary to overlap of the shoulder girdles. Multilevel cervical spondylosis. Right worse than left facet arthropathy. No discrete lytic osseous lesion. Atherosclerotic calcification present in the region of the left carotid bifurcation. Multilevel degenerative spurring throughout the thoracic spine. No evidence of thoracic compression fracture. Degenerative spurring continues to the lumbar spine. There is moderate facet arthropathy at L4-L5 and L5-S1. No discrete lytic lesion.  Skull: No discrete osseous lesion. Normal aeration of the paranasal sinuses.  Upper extremities: No discrete lytic osseous lesion. No acute fracture or malalignment. Nonunion of remote healed fracture involving the right ulnar styloid.  Pelvis: No discrete lytic osseous lesion. The bones are mildly osteopenic. Brachy therapy seeds project over the prostate gland. Mild bilateral hip joint osteoarthritis.  Lower extremities: No discrete lytic lesion. Mild scattered atherosclerotic vascular calcifications.  IMPRESSION: No discrete lytic lesion to suggest plasmacytoma or focal multiple myeloma.  Additional ancillary findings as above.   Electronically Signed   By: Jacqulynn Cadet M.D.   On: 04/20/2014 10:50    Oren Binet, MD  [facility] Pager:[phone]  If 7PM-7AM, please contact night-coverage [email or web address] Password [email or web address] 04/24/2014, 10:40 AM   LOS: 6 days

## 2014-04-24 NOTE — Progress Notes (Signed)
Subjective: Interval History: has no complaint but wants to get up and walk some more.  Objective: Vital signs in last 24 hours: Temp:  [98.1 F (36.7 C)-98.9 F (37.2 C)] 98.1 F (36.7 C) (12/14 0749) Pulse Rate:  [62-68] 62 (12/14 0749) Resp:  [18] 18 (12/14 0749) BP: (117-134)/(53-62) 117/53 mmHg (12/14 0749) SpO2:  [94 %-98 %] 96 % (12/14 0922) Weight:  [87.544 kg (193 lb)] 87.544 kg (193 lb) (12/13 1954) Weight change: 2.644 kg (5 lb 13.3 oz)  Intake/Output from previous day:   Intake/Output this shift: Total I/O In: 320 [P.O.:320] Out: 110 [Urine:110]  General appearance: alert, cooperative, mildly obese and pale Resp: clear to auscultation bilaterally Chest wall: RIJ PC Cardio: S1, S2 normal and systolic murmur: systolic ejection 2/6, decrescendo at 2nd left intercostal space GI: obese, pos bs, liver down 5 cm Extremities: edema 1+  Lab Results:  Recent Labs  04/23/14 0500 04/24/14 0442  WBC 18.4* 17.0*  HGB 9.5* 8.8*  HCT 27.0* 25.8*  PLT 275 250   BMET:  Recent Labs  04/23/14 0500 04/24/14 0442  NA 131* 134*  K 4.3 4.9  CL 93* 95*  CO2 21 21  GLUCOSE 103* 123*  BUN 81* 98*  CREATININE 6.84* 7.12*  CALCIUM 7.0* 7.1*   No results for input(s): PTH in the last 72 hours. Iron Studies: No results for input(s): IRON, TIBC, TRANSFERRIN, FERRITIN in the last 72 hours.  Studies/Results: No results found.  I have reviewed the patient's current medications.  Assessment/Plan: 1 AKI myeloma kidney.  Nonoliguric and not rapid rise Cr.  Will see what it is in am and decide re HD 2 MM per Onc 3 Anemia use epo, Fe ok 4 HTN low, cut meds 5 Obesity 6 GERD P follow chem, epo, chemo, mobilize    LOS: 6 days   Carmel Garfield L 04/24/2014,10:00 AM

## 2014-04-25 ENCOUNTER — Other Ambulatory Visit: Payer: Self-pay

## 2014-04-25 DIAGNOSIS — C9000 Multiple myeloma not having achieved remission: Secondary | ICD-10-CM

## 2014-04-25 LAB — RENAL FUNCTION PANEL
ANION GAP: 18 — AB (ref 5–15)
Albumin: 1.6 g/dL — ABNORMAL LOW (ref 3.5–5.2)
BUN: 107 mg/dL — AB (ref 6–23)
CO2: 20 meq/L (ref 19–32)
Calcium: 7.3 mg/dL — ABNORMAL LOW (ref 8.4–10.5)
Chloride: 94 mEq/L — ABNORMAL LOW (ref 96–112)
Creatinine, Ser: 7.31 mg/dL — ABNORMAL HIGH (ref 0.50–1.35)
GFR calc non Af Amer: 6 mL/min — ABNORMAL LOW (ref >90)
GFR, EST AFRICAN AMERICAN: 7 mL/min — AB (ref >90)
GLUCOSE: 153 mg/dL — AB (ref 70–99)
POTASSIUM: 5.3 meq/L (ref 3.7–5.3)
Phosphorus: 7 mg/dL — ABNORMAL HIGH (ref 2.3–4.6)
Sodium: 132 mEq/L — ABNORMAL LOW (ref 137–147)

## 2014-04-25 MED ORDER — ZOLPIDEM TARTRATE 5 MG PO TABS
2.5000 mg | ORAL_TABLET | ORAL | Status: AC | PRN
  Administered 2014-04-25 – 2014-04-26 (×2): 2.5 mg via ORAL
  Filled 2014-04-25 (×2): qty 1

## 2014-04-25 NOTE — Progress Notes (Signed)
PATIENT DETAILS Name: Miguel Compton Age: 80 y.o. Sex: male Date of Birth: November 10, 1921 Admit Date: 04/18/2014 Admitting Physician Costin Karlyne Greenspan, MD TGY:BWLS, Jenny Reichmann, MD  Brief Narrative: 80 year old highly functional male with history of COPD, hypertension, dyslipidemia and mild early dementia along with CVA in the past was admitted from PCP office for worsening renal failure. Patient was recently diagnosed with multiple myeloma. Initially thought to have renal failure from multiple myeloma, however preliminary pathology from kidney biopsy negative for this. Also underwent bone marrow biopsy which is currently pending report. Continues to have significant renal dysfunction, being followed by both nephrology and oncology. Has been started on Decadron and Velcade by oncology. Nephrology following and patient has undergone hemodialysis on 04/22/14, current plans are to follow renal function with prn dialysis and close monitoring  Subjective: No major issues overnight. BM yesterday. No complaints.  Assessment/Plan: Principal Problem: LHT:DSKAJGOTL suspicion for myeloma kidney, however biopsy negative myeloma cast nephropathy.Etiology of renal failure unclear, underwent first hemodialysis 12/12. Nephrology hopeful for eventual return of renal function, however may require further HD depending on renal function/output. Continue to monitor and closely follow.  Active Problems: Multiple myeloma:Hem/Onc consulted, underwent bone marrow biopsy on 04/21/14, pending results. As been started on Decadron and Velcade.Oncology following.  History of CVA:Non focal exam, ASA held for renal bx-has been resumed  Dyslipidemia: On statin -continue.  Mild dementia: On Aricept.Currently awake/alert X4  Essential hypertension: BP controlled, continue Coreg and amlodipine.   Constipation: Start miralax,senokot.Prn Lactulose. Having almost daily BM's  Leukocytosis: Likely due to steroid use and  possible multiple myeloma. No fever no signs of infection.Monitor off antibiotics-UA neg for UTI, CXR-+coarse infiltrates (chronic). Monitor CBC periodically  Disposition: Remain inpatient  Antibiotics:  See below  Anti-infectives    Start     Dose/Rate Route Frequency Ordered Stop   04/21/14 2200  acyclovir (ZOVIRAX) 200 MG capsule 400 mg     400 mg Oral 2 times daily 04/21/14 1658     04/21/14 0000  acyclovir (ZOVIRAX) 400 MG tablet     400 mg Oral 2 times daily 04/21/14 1659     04/19/14 0930  ceFAZolin (ANCEF) IVPB 2 g/50 mL premix    Comments:  Hang ON CALL to xray 12/9   2 g100 mL/hr over 30 Minutes Intravenous On call 04/19/14 0917 04/19/14 1133      DVT Prophylaxis: Prophylactic  Heparin  Code Status: Full code   Family Communication None at bedside  Procedures:  None  CONSULTS:  nephrology and hematology/oncology   MEDICATIONS: Scheduled Meds: . acyclovir  400 mg Oral BID  . amLODipine  5 mg Oral QHS  . aspirin  81 mg Oral Daily  . carvedilol  3.125 mg Oral BID WC  . darbepoetin (ARANESP) injection - DIALYSIS  150 mcg Intravenous Q Tue-HD  . dexamethasone  4 mg Oral Q12H  . feeding supplement (NEPRO CARB STEADY)  237 mL Oral Q1500  . fluticasone  1 puff Inhalation BID  . heparin subcutaneous  5,000 Units Subcutaneous 3 times per day  . lanthanum  1,000 mg Oral TID WC  . multivitamin  1 tablet Oral QHS  . pantoprazole  40 mg Oral Daily  . polyethylene glycol  17 g Oral BID  . pravastatin  80 mg Oral q1800  . senna  2 tablet Oral BID  . sodium chloride  3 mL Intravenous Q12H   Continuous Infusions:  PRN Meds:.albuterol, ALPRAZolam, bisacodyl, HYDROcodone-acetaminophen, lactulose,  prochlorperazine    PHYSICAL EXAM: Vital signs in last 24 hours: Filed Vitals:   04/24/14 2156 04/25/14 0515 04/25/14 0805 04/25/14 1000  BP: 130/67 129/52  126/60  Pulse: 69 61  66  Temp: 97.8 F (36.6 C) 98.4 F (36.9 C)  98 F (36.7 C)  TempSrc: Oral Oral   Oral  Resp: $Remo'18 18  18  'GWFAq$ Height:      Weight: 87.544 kg (193 lb)     SpO2: 96% 94% 95% 96%    Weight change: 0 kg (0 lb) Filed Weights   04/22/14 2117 04/23/14 1954 04/24/14 2156  Weight: 86.5 kg (190 lb 11.2 oz) 87.544 kg (193 lb) 87.544 kg (193 lb)   Body mass index is 27.69 kg/(m^2).   Gen Exam: Awake and alert with clear speech.   Neck: Supple, No JVD.   Chest: B/L Clear.  No rales CVS: S1 S2 Regular, no murmurs.  Abdomen: soft, BS +, non tender, non distended.  Extremities: no edema, lower extremities warm to touch. Neurologic: Non Focal.   Skin: No Rash.   Wounds: N/A.    Intake/Output from previous day:  Intake/Output Summary (Last 24 hours) at 04/25/14 1122 Last data filed at 04/25/14 0900  Gross per 24 hour  Intake    720 ml  Output   1225 ml  Net   -505 ml     LAB RESULTS: CBC  Recent Labs Lab 04/18/14 1311  04/20/14 1105 04/21/14 0500 04/22/14 0417 04/23/14 0500 04/24/14 0442  WBC 14.7*  < > 16.2* 15.1* 18.3* 18.4* 17.0*  HGB 10.7*  < > 9.9* 9.1* 9.2* 9.5* 8.8*  HCT 31.7*  < > 28.2* 26.4* 26.5* 27.0* 25.8*  PLT 291  < > 300 281 280 275 250  MCV 90.6  < > 88.7 86.0 85.8 86.3 86.6  MCH 30.6  < > 31.1 29.6 29.8 30.4 29.5  MCHC 33.8  < > 35.1 34.5 34.7 35.2 34.1  RDW 12.9  < > 12.9 12.9 13.0 13.1 13.1  LYMPHSABS 1.0  --   --   --   --   --   --   MONOABS 0.3  --   --   --   --   --   --   EOSABS 0.0  --   --   --   --   --   --   BASOSABS 0.0  --   --   --   --   --   --   < > = values in this interval not displayed.  Chemistries   Recent Labs Lab 04/21/14 0518 04/22/14 0417 04/22/14 1027 04/23/14 0500 04/24/14 0442 04/25/14 0520  NA 131* 133*  --  131* 134* 132*  K 4.5 4.7  --  4.3 4.9 5.3  CL 88* 91*  --  93* 95* 94*  CO2 19 16*  --  $R'21 21 20  'vQ$ GLUCOSE 132* 117*  --  103* 123* 153*  BUN 109* 125*  --  81* 98* 107*  CREATININE 10.74* 10.94* 10.30* 6.84* 7.12* 7.31*  CALCIUM 6.7* 6.6*  --  7.0* 7.1* 7.3*    CBG: No results for  input(s): GLUCAP in the last 168 hours.  GFR Estimated Creatinine Clearance: 6.9 mL/min (by C-G formula based on Cr of 7.31).  Coagulation profile  Recent Labs Lab 04/18/14 1730 04/19/14 0538  INR 1.17 1.19    Cardiac Enzymes No results for input(s): CKMB, TROPONINI, MYOGLOBIN in the last 168 hours.  Invalid input(s): CK  Invalid input(s): POCBNP No results for input(s): DDIMER in the last 72 hours. No results for input(s): HGBA1C in the last 72 hours. No results for input(s): CHOL, HDL, LDLCALC, TRIG, CHOLHDL, LDLDIRECT in the last 72 hours. No results for input(s): TSH, T4TOTAL, T3FREE, THYROIDAB in the last 72 hours.  Invalid input(s): FREET3 No results for input(s): VITAMINB12, FOLATE, FERRITIN, TIBC, IRON, RETICCTPCT in the last 72 hours. No results for input(s): LIPASE, AMYLASE in the last 72 hours.  Urine Studies No results for input(s): UHGB, CRYS in the last 72 hours.  Invalid input(s): UACOL, UAPR, USPG, UPH, UTP, UGL, UKET, UBIL, UNIT, UROB, ULEU, UEPI, UWBC, URBC, UBAC, CAST, UCOM, BILUA  MICROBIOLOGY: No results found for this or any previous visit (from the past 240 hour(s)).  RADIOLOGY STUDIES/RESULTS: Dg Chest 2 View  04/18/2014   CLINICAL DATA:  ABNORMAL LAB, shortness of breath, COPD  EXAM: CHEST - 2 VIEW  COMPARISON:  08/25/2011  FINDINGS: Coarse bilateral interstitial opacities left greater than right, with relative sparing of the apices, increased since previous exam. Heart size is normal. No effusion. Visualized skeletal structures are unremarkable.  IMPRESSION: 1. Progressive coarse bilateral asymmetric interstitial opacities, probably largely chronic.   Electronically Signed   By: Arne Cleveland M.D.   On: 04/18/2014 15:36   Ir Fluoro Guide Cv Line Right  04/19/2014   CLINICAL DATA:  Acute renal insufficiency, needs access for hemodialysis  EXAM: TUNNELED HEMODIALYSIS CATHETER PLACEMENT WITH ULTRASOUND AND FLUOROSCOPIC GUIDANCE  TECHNIQUE: The  procedure, risks, benefits, and alternatives were explained to the patient. Questions regarding the procedure were encouraged and answered. The patient understands and consents to the procedure. As antibiotic prophylaxis, cefazolin 2 g was ordered pre-procedure and administered intravenously within one hour of incision.Patency of the right IJ vein was confirmed with ultrasound with image documentation. An appropriate skin site was determined. Region was prepped using maximum barrier technique including cap and mask, sterile gown, sterile gloves, large sterile sheet, and Chlorhexidine as cutaneous antisepsis. The region was infiltrated locally with 1% lidocaine.  Intravenous Fentanyl and Versed were administered as conscious sedation during continuous cardiorespiratory monitoring by the radiology RN, with a total moderate sedation time of 25 minutes.  Under real-time ultrasound guidance, the right IJ vein was accessed with a 21 gauge micropuncture needle; the needle tip within the vein was confirmed with ultrasound image documentation. Needle exchanged over the 018 guidewire for transitional dilator, which allowed advancement of a Benson wire into the IVC. Over this, an MPA catheter was advanced. A Hemosplit 19 hemodialysis catheter was tunneled from the right anterior chest wall approach to the right IJ dermatotomy site. The MPA catheter was exchanged over an Amplatz wire for serial vascular dilators which allow placement of a peel-away sheath, through which the catheter was advanced under intermittent fluoroscopy, positioned with its tips in the proximal and midright atrium. Spot chest radiograph confirms good catheter position. No pneumothorax. Catheter was flushed and primed per protocol. Catheter secured externally with O Prolene sutures. The right IJ dermatotomy site was closed with Dermabond.  COMPLICATIONS: COMPLICATIONS None immediate  FLUOROSCOPY TIME:  48 seconds  COMPARISON:  None  IMPRESSION: 1.  Technically successful placement of tunneled right IJ hemodialysis catheter with ultrasound and fluoroscopic guidance. Ready for routine use.  ACCESS: Remains approachable for percutaneous intervention as needed.   Electronically Signed   By: Arne Cleveland M.D.   On: 04/19/2014 12:10   Ir US Guide Vasc Access Right  04/19/2014  CLINICAL DATA:  Acute renal insufficiency, needs access for hemodialysis  EXAM: TUNNELED HEMODIALYSIS CATHETER PLACEMENT WITH ULTRASOUND AND FLUOROSCOPIC GUIDANCE  TECHNIQUE: The procedure, risks, benefits, and alternatives were explained to the patient. Questions regarding the procedure were encouraged and answered. The patient understands and consents to the procedure. As antibiotic prophylaxis, cefazolin 2 g was ordered pre-procedure and administered intravenously within one hour of incision.Patency of the right IJ vein was confirmed with ultrasound with image documentation. An appropriate skin site was determined. Region was prepped using maximum barrier technique including cap and mask, sterile gown, sterile gloves, large sterile sheet, and Chlorhexidine as cutaneous antisepsis. The region was infiltrated locally with 1% lidocaine.  Intravenous Fentanyl and Versed were administered as conscious sedation during continuous cardiorespiratory monitoring by the radiology RN, with a total moderate sedation time of 25 minutes.  Under real-time ultrasound guidance, the right IJ vein was accessed with a 21 gauge micropuncture needle; the needle tip within the vein was confirmed with ultrasound image documentation. Needle exchanged over the 018 guidewire for transitional dilator, which allowed advancement of a Benson wire into the IVC. Over this, an MPA catheter was advanced. A Hemosplit 19 hemodialysis catheter was tunneled from the right anterior chest wall approach to the right IJ dermatotomy site. The MPA catheter was exchanged over an Amplatz wire for serial vascular dilators which  allow placement of a peel-away sheath, through which the catheter was advanced under intermittent fluoroscopy, positioned with its tips in the proximal and midright atrium. Spot chest radiograph confirms good catheter position. No pneumothorax. Catheter was flushed and primed per protocol. Catheter secured externally with O Prolene sutures. The right IJ dermatotomy site was closed with Dermabond.  COMPLICATIONS: COMPLICATIONS None immediate  FLUOROSCOPY TIME:  48 seconds  COMPARISON:  None  IMPRESSION: 1. Technically successful placement of tunneled right IJ hemodialysis catheter with ultrasound and fluoroscopic guidance. Ready for routine use.  ACCESS: Remains approachable for percutaneous intervention as needed.   Electronically Signed   By: Arne Cleveland M.D.   On: 04/19/2014 12:10   Ir US Guide Bx Asp/drain  04/19/2014   CLINICAL DATA:  Acute renal insufficiency  COMPARISON:  CT 03/21/2008  TECHNIQUE: ULTRASOUND GUIDED RENAL CORE BIOPSY  Survey ultrasound was performed and an appropriate skin entry site was localized. Site was marked, prepped with Betadine, draped in usual sterile fashion, infiltrated locally with 1% lidocaine. Intravenous Fentanyl and Versed were administered as conscious sedation during continuous cardiorespiratory monitoring by the radiology RN with a total moderate sedation time of 25 minutes.  Under real time ultrasound guidance, a 15 gauge trocar needle was advanced to the margin of the lower pole left kidney. The 16 gauge core needle was coaxially advanced for 3 passes. The core samples were submitted to pathology. The patient tolerated procedure well.  COMPLICATIONS: COMPLICATIONS none  IMPRESSION: 1. Technically successful ultrasound-guided core renal biopsy , lower pole left kidney.   Electronically Signed   By: Arne Cleveland M.D.   On: 04/19/2014 12:12   Ct Biopsy  04/21/2014   CLINICAL DATA:  Multiple myeloma  EXAM: CT GUIDED RIGHT ILIAC BONE MARROW ASPIRATION AND CORE  BIOPSY  Date:  12/11/201512/03/2014 10:04 am  Radiologist:  M. Daryll Brod, MD  Guidance:  CT  FLUOROSCOPY TIME:  NONE.  MEDICATIONS AND MEDICAL HISTORY: 1 mg Versed, 50 mcg fentanyl  ANESTHESIA/SEDATION: 15 min  CONTRAST:  None.  COMPLICATIONS: None immediate  PROCEDURE: Informed consent was obtained from the patient following explanation of the procedure, risks,  benefits and alternatives. The patient understands, agrees and consents for the procedure. All questions were addressed. A time out was performed.  The patient was positioned prone and noncontrast localization CT was performed of the pelvis to demonstrate the iliac marrow spaces.  Maximal barrier sterile technique utilized including caps, mask, sterile gowns, sterile gloves, large sterile drape, hand hygiene, and betadine prep.  Under sterile conditions and local anesthesia, an 11 gauge coaxial bone biopsy needle was advanced into the right iliac marrow space. Needle position was confirmed with CT imaging. Initially, bone marrow aspiration was performed. Next, the 11 gauge outer cannula was utilized to obtain a right iliac bone marrow core biopsy. Needle was removed. Hemostasis was obtained with compression. The patient tolerated the procedure well. Samples were prepared with the cytotechnologist. No immediate complications.  IMPRESSION: CT guided right iliac bone marrow aspiration and core biopsy.   Electronically Signed   By: Daryll Brod M.D.   On: 04/21/2014 10:13   Dg Bone Survey Met  04/20/2014   CLINICAL DATA:  80 year old male with multiple myeloma.  EXAM: METASTATIC BONE SURVEY  COMPARISON:  Recent prior osseous survey 04/13/2014  FINDINGS: Chest: No discrete lytic lesion in the visualized bones. Right IJ approach tunneled hemodialysis/pheresis catheter. The catheter tips project over the mid SVC. Mild cardiomegaly. Moderately large hiatal hernia. Atherosclerotic calcification noted in the transverse aorta. Stable appearance of left lingular  opacity, background bronchitic changes and interstitial prominence. No new acute cardiopulmonary finding.  Spine: Limited visualization of the cervical spine beyond the level of C6 secondary to overlap of the shoulder girdles. Multilevel cervical spondylosis. Right worse than left facet arthropathy. No discrete lytic osseous lesion. Atherosclerotic calcification present in the region of the left carotid bifurcation. Multilevel degenerative spurring throughout the thoracic spine. No evidence of thoracic compression fracture. Degenerative spurring continues to the lumbar spine. There is moderate facet arthropathy at L4-L5 and L5-S1. No discrete lytic lesion.  Skull: No discrete osseous lesion. Normal aeration of the paranasal sinuses.  Upper extremities: No discrete lytic osseous lesion. No acute fracture or malalignment. Nonunion of remote healed fracture involving the right ulnar styloid.  Pelvis: No discrete lytic osseous lesion. The bones are mildly osteopenic. Brachy therapy seeds project over the prostate gland. Mild bilateral hip joint osteoarthritis.  Lower extremities: No discrete lytic lesion. Mild scattered atherosclerotic vascular calcifications.  IMPRESSION: No discrete lytic lesion to suggest plasmacytoma or focal multiple myeloma.  Additional ancillary findings as above.   Electronically Signed   By: Jacqulynn Cadet M.D.   On: 04/20/2014 10:50    Oren Binet, MD  [facility] Pager:[phone]  If 7PM-7AM, please contact night-coverage [email or web address] Password [email or web address] 04/25/2014, 11:22 AM   LOS: 7 days

## 2014-04-25 NOTE — Progress Notes (Signed)
Subjective: Interval History: has no complaint, but wants to walk.  Objective: Vital signs in last 24 hours: Temp:  [97.8 F (36.6 C)-98.4 F (36.9 C)] 98 F (36.7 C) (12/15 1000) Pulse Rate:  [61-69] 66 (12/15 1000) Resp:  [18] 18 (12/15 1000) BP: (124-130)/(52-67) 126/60 mmHg (12/15 1000) SpO2:  [94 %-98 %] 96 % (12/15 1000) Weight:  [87.544 kg (193 lb)] 87.544 kg (193 lb) (12/14 2156) Weight change: 0 kg (0 lb)  Intake/Output from previous day: 12/14 0701 - 12/15 0700 In: 560 [P.O.:560] Out: 1255 [Urine:1255] Intake/Output this shift: Total I/O In: 480 [P.O.:480] Out: 200 [Urine:200]  General appearance: alert, cooperative, moderately obese and pale Resp: diminished breath sounds bilaterally Chest wall: R IJ cath Cardio: S1, S2 normal and systolic murmur: systolic ejection 3/6, decrescendo at 2nd left intercostal space GI: pos bs, obese, soft Extremities: extremities normal, atraumatic, no cyanosis or edema  Lab Results:  Recent Labs  04/23/14 0500 04/24/14 0442  WBC 18.4* 17.0*  HGB 9.5* 8.8*  HCT 27.0* 25.8*  PLT 275 250   BMET:  Recent Labs  04/24/14 0442 04/25/14 0520  NA 134* 132*  K 4.9 5.3  CL 95* 94*  CO2 21 20  GLUCOSE 123* 153*  BUN 98* 107*  CREATININE 7.12* 7.31*  CALCIUM 7.1* 7.3*   No results for input(s): PTH in the last 72 hours. Iron Studies: No results for input(s): IRON, TIBC, TRANSFERRIN, FERRITIN in the last 72 hours.  Studies/Results: No results found.  I have reviewed the patient's current medications.  Assessment/Plan: 1 AKI very little function but some. If no better in 24 h put on tiw regimen of HD and see if improves 2 Anemia 3 MM 4 HPTH pending 5 GERD  P check chem in am ., PTH   LOS: 7 days   Fuad Forget L 04/25/2014,11:01 AM

## 2014-04-25 NOTE — Progress Notes (Signed)
Physical Therapy Treatment Patient Details Name: Miguel Compton MRN: [id number] DOB: 06-Jan-1922 Today's Date: 04/25/2014    History of Present Illness Pt is a 80 y.o. male with Past medical history of COPD, dementia, depression, CVA, overflow incontinence. The patient is presenting with complaints of an abnormal lab. He mentions that since last 2 months he has been having generalized malaise, leg swelling, and poor appetite with weight loss as throughout the year he has been busy taking care of his wife who has been hospitalized multiple times. He has noticed he has some right-sided flank pain ongoing since last one week. He also mentions that he has occasional episodes of constipation followed by episodes of bowel movements which are consistent only mucousy output for last a year or so.    PT Comments    Pt progressing towards physical therapy goals. Was able to improve ambulation distance this session and was motivated to walk without rest breaks. At end of gait training pt reports fatigue and noted mild SOB. Pt asks to get back in bed and rest for a little while before lunch at end of session, and appeared that pt was almost asleep before PT left the room. RN notified.  Follow Up Recommendations  Supervision - Intermittent;Home health PT     Equipment Recommendations  None recommended by PT    Recommendations for Other Services       Precautions / Restrictions Precautions Precautions: Fall Restrictions Weight Bearing Restrictions: No    Mobility  Bed Mobility Overal bed mobility: Needs Assistance Bed Mobility: Sit to Supine       Sit to supine: Min guard   General bed mobility comments: Pt asking for assist to elevate LE's to up onto bed. With contact pt was able to bring legs up onto bed without any real assistance.   Transfers Overall transfer level: Modified independent Equipment used: None Transfers: Sit to/from Stand Sit to Stand: Supervision         General  transfer comment: No assist required; no unsteadines noted with dynamic activity.  Ambulation/Gait Ambulation/Gait assistance: Supervision Ambulation Distance (Feet): 600 Feet Assistive device: None Gait Pattern/deviations: Step-through pattern;Decreased stride length;Trendelenburg;Trunk flexed Gait velocity: Decreased Gait velocity interpretation: Below normal speed for age/gender General Gait Details: Pt was motivated to walk and to gain distance today. Pt reports fatigue and slight SOB at end of gait training, however did not accept offered standing or sitting rest breaks. Wanted to get back to the room to use the bathroom.    Stairs            Wheelchair Mobility    Modified Rankin (Stroke Patients Only)       Balance Overall balance assessment: Needs assistance Sitting-balance support: Feet supported;No upper extremity supported Sitting balance-Leahy Scale: Fair     Standing balance support: No upper extremity supported Standing balance-Leahy Scale: Fair                      Cognition Arousal/Alertness: Awake/alert Behavior During Therapy: WFL for tasks assessed/performed Overall Cognitive Status: Within Functional Limits for tasks assessed                      Exercises      General Comments        Pertinent Vitals/Pain Pain Assessment: Faces Faces Pain Scale: Hurts a little bit Pain Location: Low back Pain Descriptors / Indicators: Aching Pain Intervention(s): Limited activity within patient's tolerance;Monitored during session  Home Living                      Prior Function            PT Goals (current goals can now be found in the care plan section) Acute Rehab PT Goals Patient Stated Goal: Return home to his wife of 70 years PT Goal Formulation: With patient Time For Goal Achievement: 04/27/14 Potential to Achieve Goals: Good Progress towards PT goals: Progressing toward goals    Frequency  Min 3X/week     PT Plan Current plan remains appropriate    Co-evaluation             End of Session Equipment Utilized During Treatment: Gait belt Activity Tolerance: Patient tolerated treatment well Patient left: in bed;with call bell/phone within reach     Time: 1037-1100 PT Time Calculation (min) (ACUTE ONLY): 23 min  Charges:  $Gait Training: 8-22 mins $Therapeutic Activity: 8-22 mins                    G Codes:      Rolinda Roan 04-30-2014, 1:29 PM  Rolinda Roan, PT, DPT Acute Rehabilitation Services Pager: [phone]

## 2014-04-25 NOTE — Addendum Note (Signed)
Addended by: Prentiss Bells on: 04/25/2014 05:27 PM   Modules accepted: Orders

## 2014-04-26 ENCOUNTER — Telehealth: Payer: Self-pay | Admitting: Hematology and Oncology

## 2014-04-26 LAB — IMMUNOFIXATION ELECTROPHORESIS
IgA: 73 mg/dL (ref 68–379)
IgG (Immunoglobin G), Serum: 2420 mg/dL — ABNORMAL HIGH (ref 650–1600)
IgM, Serum: 47 mg/dL — ABNORMAL LOW (ref 41–251)
TOTAL PROTEIN ELP: 6.9 g/dL (ref 6.0–8.3)

## 2014-04-26 LAB — RENAL FUNCTION PANEL
ANION GAP: 21 — AB (ref 5–15)
Albumin: 1.7 g/dL — ABNORMAL LOW (ref 3.5–5.2)
BUN: 111 mg/dL — ABNORMAL HIGH (ref 6–23)
CO2: 17 mEq/L — ABNORMAL LOW (ref 19–32)
Calcium: 7.7 mg/dL — ABNORMAL LOW (ref 8.4–10.5)
Chloride: 94 mEq/L — ABNORMAL LOW (ref 96–112)
Creatinine, Ser: 7.46 mg/dL — ABNORMAL HIGH (ref 0.50–1.35)
GFR, EST AFRICAN AMERICAN: 7 mL/min — AB (ref >90)
GFR, EST NON AFRICAN AMERICAN: 6 mL/min — AB (ref >90)
Glucose, Bld: 118 mg/dL — ABNORMAL HIGH (ref 70–99)
PHOSPHORUS: 5.8 mg/dL — AB (ref 2.3–4.6)
POTASSIUM: 5.1 meq/L (ref 3.7–5.3)
SODIUM: 132 meq/L — AB (ref 137–147)

## 2014-04-26 LAB — UIFE/LIGHT CHAINS/TP QN, 24-HR UR
ALPHA 2 UR: DETECTED — AB
Albumin, U: DETECTED
Alpha 1, Urine: DETECTED — AB
Beta, Urine: DETECTED — AB
Gamma Globulin, Urine: DETECTED — AB
Total Protein, Urine: 105 mg/dL — ABNORMAL HIGH (ref 5–25)

## 2014-04-26 LAB — CBC
HEMATOCRIT: 29 % — AB (ref 39.0–52.0)
Hemoglobin: 9.8 g/dL — ABNORMAL LOW (ref 13.0–17.0)
MCH: 29.6 pg (ref 26.0–34.0)
MCHC: 33.8 g/dL (ref 30.0–36.0)
MCV: 87.6 fL (ref 78.0–100.0)
Platelets: 320 10*3/uL (ref 150–400)
RBC: 3.31 MIL/uL — ABNORMAL LOW (ref 4.22–5.81)
RDW: 13 % (ref 11.5–15.5)
WBC: 19.2 10*3/uL — ABNORMAL HIGH (ref 4.0–10.5)

## 2014-04-26 LAB — PARATHYROID HORMONE, INTACT (NO CA): PTH: 276 pg/mL — AB (ref 14–64)

## 2014-04-26 MED ORDER — SODIUM CHLORIDE 0.9 % IV SOLN: 100.0000 mL | INTRAVENOUS | Status: DC | PRN

## 2014-04-26 MED ORDER — DARBEPOETIN ALFA 150 MCG/0.3ML IJ SOSY
150.0000 ug | PREFILLED_SYRINGE | INTRAMUSCULAR | Status: DC
  Administered 2014-04-28: 150 ug via INTRAVENOUS
  Filled 2014-04-26: qty 0.3

## 2014-04-26 MED ORDER — HEPARIN SODIUM (PORCINE) 1000 UNIT/ML DIALYSIS
1000.0000 [IU] | INTRAMUSCULAR | Status: DC | PRN
  Filled 2014-04-26: qty 1

## 2014-04-26 MED ORDER — ALTEPLASE 2 MG IJ SOLR
2.0000 mg | Freq: Once | INTRAMUSCULAR | Status: AC | PRN
  Filled 2014-04-26: qty 2

## 2014-04-26 MED ORDER — HEPARIN SODIUM (PORCINE) 1000 UNIT/ML DIALYSIS
100.0000 [IU]/kg | INTRAMUSCULAR | Status: DC | PRN
  Filled 2014-04-26: qty 9

## 2014-04-26 MED ORDER — LIDOCAINE-PRILOCAINE 2.5-2.5 % EX CREA: 1.0000 "application " | TOPICAL_CREAM | CUTANEOUS | Status: DC | PRN

## 2014-04-26 MED ORDER — LIDOCAINE HCL (PF) 1 % IJ SOLN: 5.0000 mL | INTRAMUSCULAR | Status: DC | PRN

## 2014-04-26 MED ORDER — PENTAFLUOROPROP-TETRAFLUOROETH EX AERO: 1.0000 "application " | INHALATION_SPRAY | CUTANEOUS | Status: DC | PRN

## 2014-04-26 MED ORDER — NEPRO/CARBSTEADY PO LIQD: 237.0000 mL | ORAL | Status: DC | PRN

## 2014-04-26 NOTE — Procedures (Signed)
I was present at this session.  I have reviewed the session itself and made appropriate changes.  Bps in 120s.  Low flows with 2nd HD.  tol well.    [place]au Code L 12/16/20152:29 PM

## 2014-04-26 NOTE — Telephone Encounter (Signed)
lvm for pt regarding to 12.18 appt.Marland KitchenMarland Kitchen

## 2014-04-26 NOTE — Progress Notes (Signed)
PATIENT DETAILS Name: Miguel Compton Age: 80 y.o. Sex: male Date of Birth: 1922-01-13 Admit Date: 04/18/2014 Admitting Physician Costin Karlyne Greenspan, MD WUJ:[id number], Jenny Reichmann, MD  Brief Narrative: 80 year old highly functional male with history of COPD, hypertension, dyslipidemia and mild early dementia along with CVA in the past was admitted from PCP office for worsening renal failure. Patient was recently diagnosed with multiple myeloma. Initially thought to have renal failure from multiple myeloma, however preliminary pathology from kidney biopsy negative for this. Also underwent bone marrow biopsy which is currently pending report. Continues to have significant renal dysfunction, being followed by both nephrology and oncology. Has been started on Decadron and Velcade by oncology. Nephrology following and patient has undergone hemodialysis on 04/22/14, current plans are to follow renal function with prn dialysis and close monitoring  Subjective: No major issues overnight.  No complaints.  Assessment/Plan: Principal Problem: JYN:[id number] suspicion for myeloma kidney, however biopsy negative myeloma cast nephropathy.Etiology of renal failure unclear, underwent hemodialysis 12/12, Nephrology planning repeat hemodialysis on 12/16. May require further HD depending on renal function/output. Continue to monitor and closely follow.  Active Problems: Multiple myeloma:Hem/Onc consulted, underwent bone marrow biopsy on 04/21/14, pending results. As been started on Decadron and Velcade.Oncology following-spoke with Dr Lindi Adie 12/16-plans to continue Velcade/Decadron every Friday.  History of CVA:Non focal exam, ASA held for renal bx-has been resumed  Dyslipidemia: On statin -continue.  Mild dementia: On Aricept.Currently awake/alert X4  Essential hypertension: BP controlled, continue Coreg and amlodipine.   Constipation: Start miralax,senokot.Prn Lactulose. Having almost daily  BM's  Leukocytosis: Likely due to steroid use and possible multiple myeloma. No fever no signs of infection.Monitor off antibiotics-UA neg for UTI, CXR-+coarse infiltrates (chronic). Monitor CBC periodically, and follow closely  Disposition: Remain inpatient  Antibiotics:  See below  Anti-infectives    Start     Dose/Rate Route Frequency Ordered Stop   04/21/14 2200  acyclovir (ZOVIRAX) 200 MG capsule 400 mg     400 mg Oral 2 times daily 04/21/14 1658     04/21/14 0000  acyclovir (ZOVIRAX) 400 MG tablet     400 mg Oral 2 times daily 04/21/14 1659     04/19/14 0930  ceFAZolin (ANCEF) IVPB 2 g/50 mL premix    Comments:  Hang ON CALL to xray 12/9   2 g100 mL/hr over 30 Minutes Intravenous On call 04/19/14 0917 04/19/14 1133      DVT Prophylaxis: Prophylactic  Heparin  Code Status: Full code   Family Communication None at bedside  Procedures:  None  CONSULTS:  nephrology and hematology/oncology   MEDICATIONS: Scheduled Meds: . acyclovir  400 mg Oral BID  . aspirin  81 mg Oral Daily  . carvedilol  3.125 mg Oral BID WC  . darbepoetin (ARANESP) injection - DIALYSIS  150 mcg Intravenous Q Tue-HD  . dexamethasone  4 mg Oral Q12H  . feeding supplement (NEPRO CARB STEADY)  237 mL Oral Q1500  . fluticasone  1 puff Inhalation BID  . heparin subcutaneous  5,000 Units Subcutaneous 3 times per day  . lanthanum  1,000 mg Oral TID WC  . multivitamin  1 tablet Oral QHS  . pantoprazole  40 mg Oral Daily  . polyethylene glycol  17 g Oral BID  . pravastatin  80 mg Oral q1800  . senna  2 tablet Oral BID  . sodium chloride  3 mL Intravenous Q12H   Continuous Infusions:  PRN Meds:.albuterol, ALPRAZolam, bisacodyl, HYDROcodone-acetaminophen, lactulose, prochlorperazine,  zolpidem    PHYSICAL EXAM: Vital signs in last 24 hours: Filed Vitals:   04/25/14 2107 04/26/14 0547 04/26/14 0600 04/26/14 0737  BP: 152/65 126/72 138/55   Pulse: 65 65 63   Temp: 98.4 F (36.9 C) 97.6 F  (36.4 C) 98.4 F (36.9 C)   TempSrc: Oral Oral Oral   Resp: _0 Height:      Weight: 87.544 kg (193 lb)     SpO2: 97% 97% 95% 95%    Weight change: 0 kg (0 lb) Filed Weights   04/23/14 1954 04/24/14 2156 04/25/14 2107  Weight: 87.544 kg (193 lb) 87.544 kg (193 lb) 87.544 kg (193 lb)   Body mass index is 27.69 kg/(m^2).   Gen Exam: Awake and alert with clear speech.Lying comfortably in bed Neck: Supple, No JVD.   Chest: B/L Clear.  No rales/rhonchi CVS: S1 S2 Regular, no murmurs.  Abdomen: soft, BS +, non tender, non distended.  Extremities: no edema, lower extremities warm to touch. Neurologic: Non Focal.   Skin: No Rash.   Wounds: N/A.    Intake/Output from previous day:  Intake/Output Summary (Last 24 hours) at 04/26/14 1017 Last data filed at 04/26/14 0548  Gross per 24 hour  Intake    600 ml  Output   1000 ml  Net   -400 ml     LAB RESULTS: CBC  Recent Labs Lab 04/21/14 0500 04/22/14 0417 04/23/14 0500 04/24/14 0442 04/26/14 0554  WBC 15.1* 18.3* 18.4* 17.0* 19.2*  HGB 9.1* 9.2* 9.5* 8.8* 9.8*  HCT 26.4* 26.5* 27.0* 25.8* 29.0*  PLT 281 280 275 250 320  MCV 86.0 85.8 86.3 86.6 87.6  MCH 29.6 29.8 30.4 29.5 29.6  MCHC 34.5 34.7 35.2 34.1 33.8  RDW 12.9 13.0 13.1 13.1 13.0    Chemistries   Recent Labs Lab 04/22/14 0417 04/22/14 1027 04/23/14 0500 04/24/14 0442 04/25/14 0520 04/26/14 0554  NA 133*  --  131* 134* 132* 132*  K 4.7  --  4.3 4.9 5.3 5.1  CL 91*  --  93* 95* 94* 94*  CO2 16*  --  _1 17*  GLUCOSE 117*  --  103* 123* 153* 118*  BUN 125*  --  81* 98* 107* 111*  CREATININE 10.94* 10.30* 6.84* 7.12* 7.31* 7.46*  CALCIUM 6.6*  --  7.0* 7.1* 7.3* 7.7*    CBG: No results for input(s): GLUCAP in the last 168 hours.  GFR Estimated Creatinine Clearance: 6.8 mL/min (by C-G formula based on Cr of 7.46).  Coagulation profile No results for input(s): INR, PROTIME in the last 168 hours.  Cardiac Enzymes No results for  input(s): CKMB, TROPONINI, MYOGLOBIN in the last 168 hours.  Invalid input(s): CK  Invalid input(s): POCBNP No results for input(s): DDIMER in the last 72 hours. No results for input(s): HGBA1C in the last 72 hours. No results for input(s): CHOL, HDL, LDLCALC, TRIG, CHOLHDL, LDLDIRECT in the last 72 hours. No results for input(s): TSH, T4TOTAL, T3FREE, THYROIDAB in the last 72 hours.  Invalid input(s): FREET3 No results for input(s): VITAMINB12, FOLATE, FERRITIN, TIBC, IRON, RETICCTPCT in the last 72 hours. No results for input(s): LIPASE, AMYLASE in the last 72 hours.  Urine Studies No results for input(s): UHGB, CRYS in the last 72 hours.  Invalid input(s): UACOL, UAPR, USPG, UPH, UTP, UGL, UKET, UBIL, UNIT, UROB, ULEU, UEPI, UWBC, URBC, UBAC, CAST, UCOM, BILUA  MICROBIOLOGY: No results found for this or any previous visit (  from the past 240 hour(s)).  RADIOLOGY STUDIES/RESULTS: Dg Chest 2 View  04/18/2014   CLINICAL DATA:  ABNORMAL LAB, shortness of breath, COPD  EXAM: CHEST - 2 VIEW  COMPARISON:  08/25/2011  FINDINGS: Coarse bilateral interstitial opacities left greater than right, with relative sparing of the apices, increased since previous exam. Heart size is normal. No effusion. Visualized skeletal structures are unremarkable.  IMPRESSION: 1. Progressive coarse bilateral asymmetric interstitial opacities, probably largely chronic.   Electronically Signed   By: Arne Cleveland M.D.   On: 04/18/2014 15:36   Ir Fluoro Guide Cv Line Right  04/19/2014   CLINICAL DATA:  Acute renal insufficiency, needs access for hemodialysis  EXAM: TUNNELED HEMODIALYSIS CATHETER PLACEMENT WITH ULTRASOUND AND FLUOROSCOPIC GUIDANCE  TECHNIQUE: The procedure, risks, benefits, and alternatives were explained to the patient. Questions regarding the procedure were encouraged and answered. The patient understands and consents to the procedure. As antibiotic prophylaxis, cefazolin 2 g was ordered pre-procedure  and administered intravenously within one hour of incision.Patency of the right IJ vein was confirmed with ultrasound with image documentation. An appropriate skin site was determined. Region was prepped using maximum barrier technique including cap and mask, sterile gown, sterile gloves, large sterile sheet, and Chlorhexidine as cutaneous antisepsis. The region was infiltrated locally with 1% lidocaine.  Intravenous Fentanyl and Versed were administered as conscious sedation during continuous cardiorespiratory monitoring by the radiology RN, with a total moderate sedation time of 25 minutes.  Under real-time ultrasound guidance, the right IJ vein was accessed with a 21 gauge micropuncture needle; the needle tip within the vein was confirmed with ultrasound image documentation. Needle exchanged over the 018 guidewire for transitional dilator, which allowed advancement of a Benson wire into the IVC. Over this, an MPA catheter was advanced. A Hemosplit 19 hemodialysis catheter was tunneled from the right anterior chest wall approach to the right IJ dermatotomy site. The MPA catheter was exchanged over an Amplatz wire for serial vascular dilators which allow placement of a peel-away sheath, through which the catheter was advanced under intermittent fluoroscopy, positioned with its tips in the proximal and midright atrium. Spot chest radiograph confirms good catheter position. No pneumothorax. Catheter was flushed and primed per protocol. Catheter secured externally with O Prolene sutures. The right IJ dermatotomy site was closed with Dermabond.  COMPLICATIONS: COMPLICATIONS None immediate  FLUOROSCOPY TIME:  48 seconds  COMPARISON:  None  IMPRESSION: 1. Technically successful placement of tunneled right IJ hemodialysis catheter with ultrasound and fluoroscopic guidance. Ready for routine use.  ACCESS: Remains approachable for percutaneous intervention as needed.   Electronically Signed   By: Arne Cleveland M.D.   On:  04/19/2014 12:10   Ir US Guide Vasc Access Right  04/19/2014   CLINICAL DATA:  Acute renal insufficiency, needs access for hemodialysis  EXAM: TUNNELED HEMODIALYSIS CATHETER PLACEMENT WITH ULTRASOUND AND FLUOROSCOPIC GUIDANCE  TECHNIQUE: The procedure, risks, benefits, and alternatives were explained to the patient. Questions regarding the procedure were encouraged and answered. The patient understands and consents to the procedure. As antibiotic prophylaxis, cefazolin 2 g was ordered pre-procedure and administered intravenously within one hour of incision.Patency of the right IJ vein was confirmed with ultrasound with image documentation. An appropriate skin site was determined. Region was prepped using maximum barrier technique including cap and mask, sterile gown, sterile gloves, large sterile sheet, and Chlorhexidine as cutaneous antisepsis. The region was infiltrated locally with 1% lidocaine.  Intravenous Fentanyl and Versed were administered as conscious sedation during continuous cardiorespiratory monitoring  by the radiology RN, with a total moderate sedation time of 25 minutes.  Under real-time ultrasound guidance, the right IJ vein was accessed with a 21 gauge micropuncture needle; the needle tip within the vein was confirmed with ultrasound image documentation. Needle exchanged over the 018 guidewire for transitional dilator, which allowed advancement of a Benson wire into the IVC. Over this, an MPA catheter was advanced. A Hemosplit 19 hemodialysis catheter was tunneled from the right anterior chest wall approach to the right IJ dermatotomy site. The MPA catheter was exchanged over an Amplatz wire for serial vascular dilators which allow placement of a peel-away sheath, through which the catheter was advanced under intermittent fluoroscopy, positioned with its tips in the proximal and midright atrium. Spot chest radiograph confirms good catheter position. No pneumothorax. Catheter was flushed and  primed per protocol. Catheter secured externally with O Prolene sutures. The right IJ dermatotomy site was closed with Dermabond.  COMPLICATIONS: COMPLICATIONS None immediate  FLUOROSCOPY TIME:  48 seconds  COMPARISON:  None  IMPRESSION: 1. Technically successful placement of tunneled right IJ hemodialysis catheter with ultrasound and fluoroscopic guidance. Ready for routine use.  ACCESS: Remains approachable for percutaneous intervention as needed.   Electronically Signed   By: Arne Cleveland M.D.   On: 04/19/2014 12:10   Ir US Guide Bx Asp/drain  04/19/2014   CLINICAL DATA:  Acute renal insufficiency  COMPARISON:  CT 03/21/2008  TECHNIQUE: ULTRASOUND GUIDED RENAL CORE BIOPSY  Survey ultrasound was performed and an appropriate skin entry site was localized. Site was marked, prepped with Betadine, draped in usual sterile fashion, infiltrated locally with 1% lidocaine. Intravenous Fentanyl and Versed were administered as conscious sedation during continuous cardiorespiratory monitoring by the radiology RN with a total moderate sedation time of 25 minutes.  Under real time ultrasound guidance, a 15 gauge trocar needle was advanced to the margin of the lower pole left kidney. The 16 gauge core needle was coaxially advanced for 3 passes. The core samples were submitted to pathology. The patient tolerated procedure well.  COMPLICATIONS: COMPLICATIONS none  IMPRESSION: 1. Technically successful ultrasound-guided core renal biopsy , lower pole left kidney.   Electronically Signed   By: Arne Cleveland M.D.   On: 04/19/2014 12:12   Ct Biopsy  04/21/2014   CLINICAL DATA:  Multiple myeloma  EXAM: CT GUIDED RIGHT ILIAC BONE MARROW ASPIRATION AND CORE BIOPSY  Date:  12/11/201512/03/2014 10:04 am  Radiologist:  M. Daryll Brod, MD  Guidance:  CT  FLUOROSCOPY TIME:  NONE.  MEDICATIONS AND MEDICAL HISTORY: 1 mg Versed, 50 mcg fentanyl  ANESTHESIA/SEDATION: 15 min  CONTRAST:  None.  COMPLICATIONS: None immediate  PROCEDURE:  Informed consent was obtained from the patient following explanation of the procedure, risks, benefits and alternatives. The patient understands, agrees and consents for the procedure. All questions were addressed. A time out was performed.  The patient was positioned prone and noncontrast localization CT was performed of the pelvis to demonstrate the iliac marrow spaces.  Maximal barrier sterile technique utilized including caps, mask, sterile gowns, sterile gloves, large sterile drape, hand hygiene, and betadine prep.  Under sterile conditions and local anesthesia, an 11 gauge coaxial bone biopsy needle was advanced into the right iliac marrow space. Needle position was confirmed with CT imaging. Initially, bone marrow aspiration was performed. Next, the 11 gauge outer cannula was utilized to obtain a right iliac bone marrow core biopsy. Needle was removed. Hemostasis was obtained with compression. The patient tolerated the procedure well. Samples  were prepared with the cytotechnologist. No immediate complications.  IMPRESSION: CT guided right iliac bone marrow aspiration and core biopsy.   Electronically Signed   By: Daryll Brod M.D.   On: 04/21/2014 10:13   Dg Bone Survey Met  04/20/2014   CLINICAL DATA:  80 year old male with multiple myeloma.  EXAM: METASTATIC BONE SURVEY  COMPARISON:  Recent prior osseous survey 04/13/2014  FINDINGS: Chest: No discrete lytic lesion in the visualized bones. Right IJ approach tunneled hemodialysis/pheresis catheter. The catheter tips project over the mid SVC. Mild cardiomegaly. Moderately large hiatal hernia. Atherosclerotic calcification noted in the transverse aorta. Stable appearance of left lingular opacity, background bronchitic changes and interstitial prominence. No new acute cardiopulmonary finding.  Spine: Limited visualization of the cervical spine beyond the level of C6 secondary to overlap of the shoulder girdles. Multilevel cervical spondylosis. Right worse  than left facet arthropathy. No discrete lytic osseous lesion. Atherosclerotic calcification present in the region of the left carotid bifurcation. Multilevel degenerative spurring throughout the thoracic spine. No evidence of thoracic compression fracture. Degenerative spurring continues to the lumbar spine. There is moderate facet arthropathy at L4-L5 and L5-S1. No discrete lytic lesion.  Skull: No discrete osseous lesion. Normal aeration of the paranasal sinuses.  Upper extremities: No discrete lytic osseous lesion. No acute fracture or malalignment. Nonunion of remote healed fracture involving the right ulnar styloid.  Pelvis: No discrete lytic osseous lesion. The bones are mildly osteopenic. Brachy therapy seeds project over the prostate gland. Mild bilateral hip joint osteoarthritis.  Lower extremities: No discrete lytic lesion. Mild scattered atherosclerotic vascular calcifications.  IMPRESSION: No discrete lytic lesion to suggest plasmacytoma or focal multiple myeloma.  Additional ancillary findings as above.   Electronically Signed   By: Jacqulynn Cadet M.D.   On: 04/20/2014 10:50    Oren Binet, MD  [facility] Pager:[phone]  If 7PM-7AM, please contact night-coverage [email or web address] Password [email or web address] 04/26/2014, 10:17 AM   LOS: 8 days

## 2014-04-26 NOTE — Progress Notes (Signed)
Subjective: Interval History: has no complaint .  Objective: Vital signs in last 24 hours: Temp:  [97.6 F (36.4 C)-98.6 F (37 C)] 98.4 F (36.9 C) (12/16 0600) Pulse Rate:  [63-68] 63 (12/16 0600) Resp:  [18] 18 (12/16 0600) BP: (126-158)/(55-72) 138/55 mmHg (12/16 0600) SpO2:  [95 %-98 %] 95 % (12/16 0737) Weight:  [87.544 kg (193 lb)] 87.544 kg (193 lb) (12/15 2107) Weight change: 0 kg (0 lb)  Intake/Output from previous day: 12/15 0701 - 12/16 0700 In: 1080 [P.O.:1080] Out: 1200 [Urine:1200] Intake/Output this shift:    General appearance: alert, cooperative, mildly obese and pale Resp: diminished breath sounds bilaterally and rales bibasilar Chest wall: RIJ cath Cardio: S1, S2 normal and systolic murmur: holosystolic 2/6, blowing at apex GI: pos bs, liver down 5 cm, soft Extremities: extremities normal, atraumatic, no cyanosis or edema  Lab Results:  Recent Labs  04/24/14 0442 04/26/14 0554  WBC 17.0* 19.2*  HGB 8.8* 9.8*  HCT 25.8* 29.0*  PLT 250 320   BMET:  Recent Labs  04/25/14 0520 04/26/14 0554  NA 132* 132*  K 5.3 5.1  CL 94* 94*  CO2 20 17*  GLUCOSE 153* 118*  BUN 107* 111*  CREATININE 7.31* 7.46*  CALCIUM 7.3* 7.7*   No results for input(s): PTH in the last 72 hours. Iron Studies: No results for input(s): IRON, TIBC, TRANSFERRIN, FERRITIN in the last 72 hours.  Studies/Results: No results found.  I have reviewed the patient's current medications.  Assessment/Plan: 1 AKI  Needs ongoing HD, will do tiw, follow pre HD chem.  Vol ok and nonoliguric, no clearance 2 MM will do HD on Fri and do chemo 3 Anemia on epo 4 GERD 5 HTN coming down, stop meds P HD, epo, chemo on Fri    LOS: 8 days   Rabecca Birge L 04/26/2014,7:48 AM

## 2014-04-27 LAB — RENAL FUNCTION PANEL
ANION GAP: 15 (ref 5–15)
Albumin: 1.8 g/dL — ABNORMAL LOW (ref 3.5–5.2)
BUN: 72 mg/dL — AB (ref 6–23)
CO2: 22 mEq/L (ref 19–32)
Calcium: 7.8 mg/dL — ABNORMAL LOW (ref 8.4–10.5)
Chloride: 94 mEq/L — ABNORMAL LOW (ref 96–112)
Creatinine, Ser: 5.07 mg/dL — ABNORMAL HIGH (ref 0.50–1.35)
GFR calc Af Amer: 10 mL/min — ABNORMAL LOW (ref >90)
GFR calc non Af Amer: 9 mL/min — ABNORMAL LOW (ref >90)
Glucose, Bld: 128 mg/dL — ABNORMAL HIGH (ref 70–99)
Phosphorus: 4.7 mg/dL — ABNORMAL HIGH (ref 2.3–4.6)
Potassium: 5.1 mEq/L (ref 3.7–5.3)
SODIUM: 131 meq/L — AB (ref 137–147)

## 2014-04-27 MED ORDER — CALCITRIOL 0.5 MCG PO CAPS
0.5000 ug | ORAL_CAPSULE | ORAL | Status: DC
  Administered 2014-04-28 – 2014-05-03 (×3): 0.5 ug via ORAL
  Filled 2014-04-27 (×3): qty 1

## 2014-04-27 MED ORDER — DEXAMETHASONE 6 MG PO TABS
20.0000 mg | ORAL_TABLET | ORAL | Status: DC
  Administered 2014-04-28: 20 mg via ORAL
  Filled 2014-04-27: qty 1

## 2014-04-27 NOTE — Progress Notes (Signed)
PATIENT DETAILS Name: Miguel Compton Age: 80 y.o. Sex: male Date of Birth: May 31, 1921 Admit Date: 04/18/2014 Admitting Physician Costin Karlyne Greenspan, MD [id number], Jenny Reichmann, MD  Brief Narrative: 80 year old highly functional male with history of COPD, hypertension, dyslipidemia and mild early dementia along with CVA in the past was admitted from PCP office for worsening renal failure. Patient was recently diagnosed with multiple myeloma. Initially thought to have renal failure from multiple myeloma, however preliminary pathology from kidney biopsy negative for this. Also underwent bone marrow biopsy which is currently pending report. Continues to have significant renal dysfunction, being followed by both nephrology and oncology. Has been started on Decadron and Velcade by oncology. Nephrology following and patient has undergone hemodialysis on 04/22/14, current plans are to follow renal function with prn dialysis and close monitoring  Subjective: No major issues overnight. BM yesterday. Frustrated about continued hospitalization  Assessment/Plan: Principal Problem: EKC:[id number] suspicion for myeloma kidney, however biopsy negative myeloma cast nephropathy.Etiology of renal failure unclear, underwent first hemodialysis 12/12, and second HD on 12/16. Nephrology hopeful for eventual return of renal function, however may require further HD depending on renal function/output. Continue to monitor and closely follow.  Active Problems: Multiple myeloma:Hem/Onc consulted, underwent bone marrow biopsy on 04/21/14, pathology confirms myeloma.Has been started on Decadron and Velcade.Oncology following-Dr Lindi Adie planning chemoTx on 12/18  History of CVA:Non focal exam, ASA held for renal bx-has been resumed  Dyslipidemia: On statin -continue.  Mild dementia: On Aricept.Currently awake/alert X4  Essential hypertension: BP moderatly controlled, continue Coreg, Amlodipine discontinued-as vol being  removed with HD. Marland Kitchen   Constipation: continue  miralax,senokot.Prn Lactulose. Having almost daily BM's  Leukocytosis: Likely due to steroid use and possible multiple myeloma. No fever no signs of infection.Monitor off antibiotics-UA neg for UTI, CXR-+coarse infiltrates (chronic). Monitor CBC periodically  Disposition: Remain inpatient  Antibiotics:  See below  Anti-infectives    Start     Dose/Rate Route Frequency Ordered Stop   04/21/14 2200  acyclovir (ZOVIRAX) 200 MG capsule 400 mg     400 mg Oral 2 times daily 04/21/14 1658     04/21/14 0000  acyclovir (ZOVIRAX) 400 MG tablet     400 mg Oral 2 times daily 04/21/14 1659     04/19/14 0930  ceFAZolin (ANCEF) IVPB 2 g/50 mL premix    Comments:  Hang ON CALL to xray 12/9   2 g100 mL/hr over 30 Minutes Intravenous On call 04/19/14 0917 04/19/14 1133      DVT Prophylaxis: Prophylactic  Heparin  Code Status: Full code   Family Communication Son in law at bedside  Procedures:  None  CONSULTS:  nephrology and hematology/oncology   MEDICATIONS: Scheduled Meds: . acyclovir  400 mg Oral BID  . aspirin  81 mg Oral Daily  . [START ON 04/28/2014] calcitRIOL  0.5 mcg Oral Q M,W,F-HD  . carvedilol  3.125 mg Oral BID WC  . [START ON 04/28/2014] darbepoetin (ARANESP) injection - DIALYSIS  150 mcg Intravenous Q Fri-HD  . dexamethasone  4 mg Oral Q12H  . feeding supplement (NEPRO CARB STEADY)  237 mL Oral Q1500  . fluticasone  1 puff Inhalation BID  . heparin subcutaneous  5,000 Units Subcutaneous 3 times per day  . lanthanum  1,000 mg Oral TID WC  . multivitamin  1 tablet Oral QHS  . pantoprazole  40 mg Oral Daily  . polyethylene glycol  17 g Oral BID  . pravastatin  80 mg Oral  q38  . senna  2 tablet Oral BID  . sodium chloride  3 mL Intravenous Q12H   Continuous Infusions:  PRN Meds:.sodium chloride, sodium chloride, albuterol, ALPRAZolam, bisacodyl, feeding supplement (NEPRO CARB STEADY), heparin, heparin,  HYDROcodone-acetaminophen, lactulose, lidocaine (PF), lidocaine-prilocaine, pentafluoroprop-tetrafluoroeth, prochlorperazine    PHYSICAL EXAM: Vital signs in last 24 hours: Filed Vitals:   04/26/14 2048 04/27/14 0431 04/27/14 0857 04/27/14 1000  BP: 162/68 163/70  166/70  Pulse: 65 64 65 65  Temp: 97.9 F (36.6 C) 97.7 F (36.5 C)  98.4 F (36.9 C)  TempSrc: Oral Oral  Oral  Resp: _0 Height:      Weight: 84.1 kg (185 lb 6.5 oz)     SpO2: 98% 94% 95% 98%    Weight change: -1.144 kg (-2 lb 8.4 oz) Filed Weights   04/26/14 1145 04/26/14 1455 04/26/14 2048  Weight: 86.4 kg (190 lb 7.6 oz) 84.4 kg (186 lb 1.1 oz) 84.1 kg (185 lb 6.5 oz)   Body mass index is 26.6 kg/(m^2).   Gen Exam: Awake and alert with clear speech.   Neck: Supple, No JVD.   Chest: B/L Clear.  No rales or rhonchi CVS: S1 S2 Regular, no murmurs.  Abdomen: soft, BS +, non tender, non distended.  Extremities: no edema, lower extremities warm to touch. Neurologic: Non Focal.   Skin: No Rash.   Wounds: N/A.    Intake/Output from previous day:  Intake/Output Summary (Last 24 hours) at 04/27/14 1141 Last data filed at 04/27/14 0906  Gross per 24 hour  Intake    480 ml  Output   1200 ml  Net   -720 ml     LAB RESULTS: CBC  Recent Labs Lab 04/21/14 0500 04/22/14 0417 04/23/14 0500 04/24/14 0442 04/26/14 0554  WBC 15.1* 18.3* 18.4* 17.0* 19.2*  HGB 9.1* 9.2* 9.5* 8.8* 9.8*  HCT 26.4* 26.5* 27.0* 25.8* 29.0*  PLT 281 280 275 250 320  MCV 86.0 85.8 86.3 86.6 87.6  MCH 29.6 29.8 30.4 29.5 29.6  MCHC 34.5 34.7 35.2 34.1 33.8  RDW 12.9 13.0 13.1 13.1 13.0    Chemistries   Recent Labs Lab 04/23/14 0500 04/24/14 0442 04/25/14 0520 04/26/14 0554 04/27/14 0430  NA 131* 134* 132* 132* 131*  K 4.3 4.9 5.3 5.1 5.1  CL 93* 95* 94* 94* 94*  CO2 _1 17* 22  GLUCOSE 103* 123* 153* 118* 128*  BUN 81* 98* 107* 111* 72*  CREATININE 6.84* 7.12* 7.31* 7.46* 5.07*  CALCIUM 7.0* 7.1*  7.3* 7.7* 7.8*    CBG: No results for input(s): GLUCAP in the last 168 hours.  GFR Estimated Creatinine Clearance: 10 mL/min (by C-G formula based on Cr of 5.07).  Coagulation profile No results for input(s): INR, PROTIME in the last 168 hours.  Cardiac Enzymes No results for input(s): CKMB, TROPONINI, MYOGLOBIN in the last 168 hours.  Invalid input(s): CK  Invalid input(s): POCBNP No results for input(s): DDIMER in the last 72 hours. No results for input(s): HGBA1C in the last 72 hours. No results for input(s): CHOL, HDL, LDLCALC, TRIG, CHOLHDL, LDLDIRECT in the last 72 hours. No results for input(s): TSH, T4TOTAL, T3FREE, THYROIDAB in the last 72 hours.  Invalid input(s): FREET3 No results for input(s): VITAMINB12, FOLATE, FERRITIN, TIBC, IRON, RETICCTPCT in the last 72 hours. No results for input(s): LIPASE, AMYLASE in the last 72 hours.  Urine Studies No results for input(s): UHGB, CRYS in the last 72 hours.  Invalid input(s): UACOL, UAPR, USPG, UPH, UTP, UGL, UKET, UBIL, UNIT, UROB, ULEU, UEPI, UWBC, URBC, UBAC, CAST, UCOM, BILUA  MICROBIOLOGY: No results found for this or any previous visit (from the past 240 hour(s)).  RADIOLOGY STUDIES/RESULTS: Dg Chest 2 View  04/18/2014   CLINICAL DATA:  ABNORMAL LAB, shortness of breath, COPD  EXAM: CHEST - 2 VIEW  COMPARISON:  08/25/2011  FINDINGS: Coarse bilateral interstitial opacities left greater than right, with relative sparing of the apices, increased since previous exam. Heart size is normal. No effusion. Visualized skeletal structures are unremarkable.  IMPRESSION: 1. Progressive coarse bilateral asymmetric interstitial opacities, probably largely chronic.   Electronically Signed   By: Arne Cleveland M.D.   On: 04/18/2014 15:36   Ir Fluoro Guide Cv Line Right  04/19/2014   CLINICAL DATA:  Acute renal insufficiency, needs access for hemodialysis  EXAM: TUNNELED HEMODIALYSIS CATHETER PLACEMENT WITH ULTRASOUND AND  FLUOROSCOPIC GUIDANCE  TECHNIQUE: The procedure, risks, benefits, and alternatives were explained to the patient. Questions regarding the procedure were encouraged and answered. The patient understands and consents to the procedure. As antibiotic prophylaxis, cefazolin 2 g was ordered pre-procedure and administered intravenously within one hour of incision.Patency of the right IJ vein was confirmed with ultrasound with image documentation. An appropriate skin site was determined. Region was prepped using maximum barrier technique including cap and mask, sterile gown, sterile gloves, large sterile sheet, and Chlorhexidine as cutaneous antisepsis. The region was infiltrated locally with 1% lidocaine.  Intravenous Fentanyl and Versed were administered as conscious sedation during continuous cardiorespiratory monitoring by the radiology RN, with a total moderate sedation time of 25 minutes.  Under real-time ultrasound guidance, the right IJ vein was accessed with a 21 gauge micropuncture needle; the needle tip within the vein was confirmed with ultrasound image documentation. Needle exchanged over the 018 guidewire for transitional dilator, which allowed advancement of a Benson wire into the IVC. Over this, an MPA catheter was advanced. A Hemosplit 19 hemodialysis catheter was tunneled from the right anterior chest wall approach to the right IJ dermatotomy site. The MPA catheter was exchanged over an Amplatz wire for serial vascular dilators which allow placement of a peel-away sheath, through which the catheter was advanced under intermittent fluoroscopy, positioned with its tips in the proximal and midright atrium. Spot chest radiograph confirms good catheter position. No pneumothorax. Catheter was flushed and primed per protocol. Catheter secured externally with O Prolene sutures. The right IJ dermatotomy site was closed with Dermabond.  COMPLICATIONS: COMPLICATIONS None immediate  FLUOROSCOPY TIME:  48 seconds   COMPARISON:  None  IMPRESSION: 1. Technically successful placement of tunneled right IJ hemodialysis catheter with ultrasound and fluoroscopic guidance. Ready for routine use.  ACCESS: Remains approachable for percutaneous intervention as needed.   Electronically Signed   By: Arne Cleveland M.D.   On: 04/19/2014 12:10   Ir US Guide Vasc Access Right  04/19/2014   CLINICAL DATA:  Acute renal insufficiency, needs access for hemodialysis  EXAM: TUNNELED HEMODIALYSIS CATHETER PLACEMENT WITH ULTRASOUND AND FLUOROSCOPIC GUIDANCE  TECHNIQUE: The procedure, risks, benefits, and alternatives were explained to the patient. Questions regarding the procedure were encouraged and answered. The patient understands and consents to the procedure. As antibiotic prophylaxis, cefazolin 2 g was ordered pre-procedure and administered intravenously within one hour of incision.Patency of the right IJ vein was confirmed with ultrasound with image documentation. An appropriate skin site was determined. Region was prepped using maximum barrier technique including cap and mask, sterile gown, sterile  gloves, large sterile sheet, and Chlorhexidine as cutaneous antisepsis. The region was infiltrated locally with 1% lidocaine.  Intravenous Fentanyl and Versed were administered as conscious sedation during continuous cardiorespiratory monitoring by the radiology RN, with a total moderate sedation time of 25 minutes.  Under real-time ultrasound guidance, the right IJ vein was accessed with a 21 gauge micropuncture needle; the needle tip within the vein was confirmed with ultrasound image documentation. Needle exchanged over the 018 guidewire for transitional dilator, which allowed advancement of a Benson wire into the IVC. Over this, an MPA catheter was advanced. A Hemosplit 19 hemodialysis catheter was tunneled from the right anterior chest wall approach to the right IJ dermatotomy site. The MPA catheter was exchanged over an Amplatz wire for  serial vascular dilators which allow placement of a peel-away sheath, through which the catheter was advanced under intermittent fluoroscopy, positioned with its tips in the proximal and midright atrium. Spot chest radiograph confirms good catheter position. No pneumothorax. Catheter was flushed and primed per protocol. Catheter secured externally with O Prolene sutures. The right IJ dermatotomy site was closed with Dermabond.  COMPLICATIONS: COMPLICATIONS None immediate  FLUOROSCOPY TIME:  48 seconds  COMPARISON:  None  IMPRESSION: 1. Technically successful placement of tunneled right IJ hemodialysis catheter with ultrasound and fluoroscopic guidance. Ready for routine use.  ACCESS: Remains approachable for percutaneous intervention as needed.   Electronically Signed   By: Arne Cleveland M.D.   On: 04/19/2014 12:10   Ir US Guide Bx Asp/drain  04/19/2014   CLINICAL DATA:  Acute renal insufficiency  COMPARISON:  CT 03/21/2008  TECHNIQUE: ULTRASOUND GUIDED RENAL CORE BIOPSY  Survey ultrasound was performed and an appropriate skin entry site was localized. Site was marked, prepped with Betadine, draped in usual sterile fashion, infiltrated locally with 1% lidocaine. Intravenous Fentanyl and Versed were administered as conscious sedation during continuous cardiorespiratory monitoring by the radiology RN with a total moderate sedation time of 25 minutes.  Under real time ultrasound guidance, a 15 gauge trocar needle was advanced to the margin of the lower pole left kidney. The 16 gauge core needle was coaxially advanced for 3 passes. The core samples were submitted to pathology. The patient tolerated procedure well.  COMPLICATIONS: COMPLICATIONS none  IMPRESSION: 1. Technically successful ultrasound-guided core renal biopsy , lower pole left kidney.   Electronically Signed   By: Arne Cleveland M.D.   On: 04/19/2014 12:12   Ct Biopsy  04/21/2014   CLINICAL DATA:  Multiple myeloma  EXAM: CT GUIDED RIGHT ILIAC BONE  MARROW ASPIRATION AND CORE BIOPSY  Date:  12/11/201512/03/2014 10:04 am  Radiologist:  M. Daryll Brod, MD  Guidance:  CT  FLUOROSCOPY TIME:  NONE.  MEDICATIONS AND MEDICAL HISTORY: 1 mg Versed, 50 mcg fentanyl  ANESTHESIA/SEDATION: 15 min  CONTRAST:  None.  COMPLICATIONS: None immediate  PROCEDURE: Informed consent was obtained from the patient following explanation of the procedure, risks, benefits and alternatives. The patient understands, agrees and consents for the procedure. All questions were addressed. A time out was performed.  The patient was positioned prone and noncontrast localization CT was performed of the pelvis to demonstrate the iliac marrow spaces.  Maximal barrier sterile technique utilized including caps, mask, sterile gowns, sterile gloves, large sterile drape, hand hygiene, and betadine prep.  Under sterile conditions and local anesthesia, an 11 gauge coaxial bone biopsy needle was advanced into the right iliac marrow space. Needle position was confirmed with CT imaging. Initially, bone marrow aspiration was performed. Next,  the 11 gauge outer cannula was utilized to obtain a right iliac bone marrow core biopsy. Needle was removed. Hemostasis was obtained with compression. The patient tolerated the procedure well. Samples were prepared with the cytotechnologist. No immediate complications.  IMPRESSION: CT guided right iliac bone marrow aspiration and core biopsy.   Electronically Signed   By: Daryll Brod M.D.   On: 04/21/2014 10:13   Dg Bone Survey Met  04/20/2014   CLINICAL DATA:  80 year old male with multiple myeloma.  EXAM: METASTATIC BONE SURVEY  COMPARISON:  Recent prior osseous survey 04/13/2014  FINDINGS: Chest: No discrete lytic lesion in the visualized bones. Right IJ approach tunneled hemodialysis/pheresis catheter. The catheter tips project over the mid SVC. Mild cardiomegaly. Moderately large hiatal hernia. Atherosclerotic calcification noted in the transverse aorta. Stable  appearance of left lingular opacity, background bronchitic changes and interstitial prominence. No new acute cardiopulmonary finding.  Spine: Limited visualization of the cervical spine beyond the level of C6 secondary to overlap of the shoulder girdles. Multilevel cervical spondylosis. Right worse than left facet arthropathy. No discrete lytic osseous lesion. Atherosclerotic calcification present in the region of the left carotid bifurcation. Multilevel degenerative spurring throughout the thoracic spine. No evidence of thoracic compression fracture. Degenerative spurring continues to the lumbar spine. There is moderate facet arthropathy at L4-L5 and L5-S1. No discrete lytic lesion.  Skull: No discrete osseous lesion. Normal aeration of the paranasal sinuses.  Upper extremities: No discrete lytic osseous lesion. No acute fracture or malalignment. Nonunion of remote healed fracture involving the right ulnar styloid.  Pelvis: No discrete lytic osseous lesion. The bones are mildly osteopenic. Brachy therapy seeds project over the prostate gland. Mild bilateral hip joint osteoarthritis.  Lower extremities: No discrete lytic lesion. Mild scattered atherosclerotic vascular calcifications.  IMPRESSION: No discrete lytic lesion to suggest plasmacytoma or focal multiple myeloma.  Additional ancillary findings as above.   Electronically Signed   By: Jacqulynn Cadet M.D.   On: 04/20/2014 10:50    Oren Binet, MD  [facility] Pager:[phone]  If 7PM-7AM, please contact night-coverage [email or web address] Password [email or web address] 04/27/2014, 11:41 AM   LOS: 9 days

## 2014-04-27 NOTE — Progress Notes (Signed)
Subjective: Interval History: has complaints frustrated about not getting better.  Objective: Vital signs in last 24 hours: Temp:  [97.6 F (36.4 C)-98.4 F (36.9 C)] 97.7 F (36.5 C) (12/17 0431) Pulse Rate:  [58-96] 65 (12/17 0857) Resp:  [14-23] 18 (12/17 0857) BP: (109-166)/(65-83) 163/70 mmHg (12/17 0431) SpO2:  [94 %-98 %] 95 % (12/17 0857) Weight:  [84.1 kg (185 lb 6.5 oz)-86.4 kg (190 lb 7.6 oz)] 84.1 kg (185 lb 6.5 oz) (12/16 2048) Weight change: -1.144 kg (-2 lb 8.4 oz)  Intake/Output from previous day: 12/16 0701 - 12/17 0700 In: 480 [P.O.:480] Out: 1300 [Urine:300] Intake/Output this shift:    General appearance: alert, cooperative, mildly obese and pale Neck: RIJ cath Resp: rales bibasilar Cardio: S1, S2 normal and systolic murmur: systolic ejection 2/6, decrescendo at 2nd left intercostal space GI: obese, pos bs, liver down 5 cm Extremities: edema 1+  Lab Results:  Recent Labs  04/26/14 0554  WBC 19.2*  HGB 9.8*  HCT 29.0*  PLT 320   BMET:  Recent Labs  04/26/14 0554 04/27/14 0430  NA 132* 131*  K 5.1 5.1  CL 94* 94*  CO2 17* 22  GLUCOSE 118* 128*  BUN 111* 72*  CREATININE 7.46* 5.07*  CALCIUM 7.7* 7.8*    Recent Labs  04/25/14 1418  PTH 276*   Iron Studies: No results for input(s): IRON, TIBC, TRANSFERRIN, FERRITIN in the last 72 hours.  Studies/Results: No results found.  I have reviewed the patient's current medications.  Assessment/Plan: 1 AKI vol xs mild.  Will check pre HD chem in am.  Nonoliguric 2 Anemia on epo 3 MM to get chemo 4 HPTH tx with vit D 5 HTN control vol , meds 6 GERD P HD in am, epo, Vit D, chemo, spent 30 min discussing status, AKI with patient/son    LOS: 9 days   [place] Spainhower L 04/27/2014,9:47 AM

## 2014-04-27 NOTE — Progress Notes (Signed)
NUTRITION FOLLOW UP  INTERVENTION: Continue Nepro Shake po once daily, each supplement provides 425 kcal and 19 grams protein  Encourage adequate PO intake.   NUTRITION DIAGNOSIS: Increased nutrient needs related to acute renal failure as evidenced by estimated nutrition needs; ongoing  Goal: Pt to meet >/= 90% of their estimated nutrition needs; met  Monitor:  PO intake, weight trends, labs, I/O's  80 y.o. male  Admitting Dx: Renal insufficiency  ASSESSMENT: Pt with a past medical history significant for COPD, hypertension, hyperlipidemia, mild dementia, history of CVA, presents to the emergency room as he was sent by his doctor for abnormal blood work.   12/9: R IJ HD cathter placement with [place] and fluoroscopy and renal bx 12/11: RT ILIAC BM ASP AND CORE BX  Meal completion has been 80-100%. HD started 12/12. Pt with multiple myeloma-plan for chemo on Friday. Will continue with current orders. Encourage adequate PO intake.  Labs: Low sodium, calcium, chloride, and GFR. High BUN, creatinine, phosphorous (4.7).   Height: Ht Readings from Last 1 Encounters:  04/22/14 $RemoveB'5\' 10"'[id number]$  (1.778 m)    Weight: Wt Readings from Last 1 Encounters:  04/26/14 185 lb 6.5 oz (84.1 kg)  12/10  187 lbs  Body mass index is 26.6 kg/(m^2).   Re-Estimated Nutritional Needs: Kcal: 1850-2100 Protein: 90-110 grams Fluid: 1.2 L/day  Skin: non-pitting LE edema; incision lower back, and right pelvis  Diet Order: Diet renal W/1222mL fluid restriction    Intake/Output Summary (Last 24 hours) at 04/27/14 0931 Last data filed at 04/26/14 1700  Gross per 24 hour  Intake    240 ml  Output   1100 ml  Net   -860 ml    Last BM: 12/16  Labs:   Recent Labs Lab 04/25/14 0520 04/26/14 0554 04/27/14 0430  NA 132* 132* 131*  K 5.3 5.1 5.1  CL 94* 94* 94*  CO2 20 17* 22  BUN 107* 111* 72*  CREATININE 7.31* 7.46* 5.07*  CALCIUM 7.3* 7.7* 7.8*  PHOS 7.0* 5.8* 4.7*  GLUCOSE 153* 118* 128*     CBG (last 3)  No results for input(s): GLUCAP in the last 72 hours.  Scheduled Meds: . acyclovir  400 mg Oral BID  . aspirin  81 mg Oral Daily  . carvedilol  3.125 mg Oral BID WC  . [START ON 04/28/2014] darbepoetin (ARANESP) injection - DIALYSIS  150 mcg Intravenous Q Fri-HD  . dexamethasone  4 mg Oral Q12H  . feeding supplement (NEPRO CARB STEADY)  237 mL Oral Q1500  . fluticasone  1 puff Inhalation BID  . heparin subcutaneous  5,000 Units Subcutaneous 3 times per day  . lanthanum  1,000 mg Oral TID WC  . multivitamin  1 tablet Oral QHS  . pantoprazole  40 mg Oral Daily  . polyethylene glycol  17 g Oral BID  . pravastatin  80 mg Oral q1800  . senna  2 tablet Oral BID  . sodium chloride  3 mL Intravenous Q12H    Continuous Infusions:   Past Medical History  Diagnosis Date  . High cholesterol   . Depression   . GERD (gastroesophageal reflux disease)   . Hypertension   . Arthritis     "slight; in my hands" (04/18/2014)  . Anxiety   . Renal insufficiency   . Peripheral neuropathy     "fingers go to sleep"  . Dementia     "mild"  . Skin cancer of face     "I believe they  burned them off"  . Prostate cancer     Past Surgical History  Procedure Laterality Date  . Tonsillectomy  1920's  . Cataract extraction, bilateral Bilateral 2000's  . Insertion prostate radiation seed  04/2001  . Cardiac catheterization  1985's    Kallie Locks, MS, RD, LDN Pager # [phone] After hours/ weekend pager # [phone]

## 2014-04-27 NOTE — Progress Notes (Signed)
Physical Therapy Treatment Patient Details Name: Miguel Compton MRN: [id number] DOB: August 21, 1921 Today's Date: 04/27/2014    History of Present Illness Pt is a 80 y.o. male with Past medical history of COPD, dementia, depression, CVA, overflow incontinence. The patient is presenting with complaints of an abnormal lab. He mentions that since last 2 months he has been having generalized malaise, leg swelling, and poor appetite with weight loss as throughout the year he has been busy taking care of his wife who has been hospitalized multiple times. He has noticed he has some right-sided flank pain ongoing since last one week. He also mentions that he has occasional episodes of constipation followed by episodes of bowel movements which are consistent only mucousy output for last a year or so.    PT Comments    Pt progressing towards physical therapy goals. Session limited today by visitors, as pt eager to spend time with them. Pt was able to perform transfers with mod I including sit<>stand from the low commode, and perform dynamic balance with peri-care without assist. Will continue to follow.  Follow Up Recommendations  Supervision - Intermittent;Home health PT     Equipment Recommendations  None recommended by PT    Recommendations for Other Services       Precautions / Restrictions Precautions Precautions: Fall Restrictions Weight Bearing Restrictions: No    Mobility  Bed Mobility               General bed mobility comments: Pt sitting up in recliner chair upon PT arrival.   Transfers Overall transfer level: Modified independent Equipment used: None Transfers: Sit to/from Stand           General transfer comment: No assist required; no unsteadines noted with dynamic activity.  Ambulation/Gait Ambulation/Gait assistance: Modified independent (Device/Increase time) Ambulation Distance (Feet): 25 Feet Assistive device: None Gait Pattern/deviations: Step-through  pattern;Decreased stride length;Trunk flexed;Narrow base of support Gait velocity: Decreased Gait velocity interpretation: Below normal speed for age/gender General Gait Details: Pt only agreeable to ambulate in the room as he had visitors come in while he was in the bathroom.    Stairs            Wheelchair Mobility    Modified Rankin (Stroke Patients Only)       Balance Overall balance assessment: Needs assistance Sitting-balance support: Feet supported;No upper extremity supported Sitting balance-Leahy Scale: Fair     Standing balance support: No upper extremity supported Standing balance-Leahy Scale: Fair                      Cognition Arousal/Alertness: Awake/alert Behavior During Therapy: WFL for tasks assessed/performed Overall Cognitive Status: Within Functional Limits for tasks assessed                      Exercises      General Comments        Pertinent Vitals/Pain Pain Assessment: No/denies pain    Home Living                      Prior Function            PT Goals (current goals can now be found in the care plan section) Acute Rehab PT Goals Patient Stated Goal: Return home to his wife of 76 years PT Goal Formulation: With patient Time For Goal Achievement: 04/27/14 Potential to Achieve Goals: Good Progress towards PT goals: Progressing toward goals  Frequency  Min 3X/week    PT Plan Current plan remains appropriate    Co-evaluation             End of Session Equipment Utilized During Treatment: Gait belt Activity Tolerance: Patient tolerated treatment well Patient left: in chair;with call bell/phone within reach;with family/visitor present     Time: 9244-6286 PT Time Calculation (min) (ACUTE ONLY): 14 min  Charges:  $Therapeutic Activity: 8-22 mins                    G Codes:      Rolinda Roan 05-May-2014, 4:07 PM   Rolinda Roan, PT, DPT Acute Rehabilitation Services Pager:  [phone]

## 2014-04-28 ENCOUNTER — Telehealth: Payer: Self-pay | Admitting: Hematology and Oncology

## 2014-04-28 ENCOUNTER — Ambulatory Visit: Payer: Medicare Other

## 2014-04-28 ENCOUNTER — Telehealth: Payer: Self-pay

## 2014-04-28 ENCOUNTER — Other Ambulatory Visit: Payer: Medicare Other

## 2014-04-28 ENCOUNTER — Ambulatory Visit: Payer: Medicare Other | Admitting: Hematology and Oncology

## 2014-04-28 LAB — RENAL FUNCTION PANEL
ALBUMIN: 1.7 g/dL — AB (ref 3.5–5.2)
ANION GAP: 13 (ref 5–15)
BUN: 86 mg/dL — ABNORMAL HIGH (ref 6–23)
CALCIUM: 8.1 mg/dL — AB (ref 8.4–10.5)
CO2: 25 mEq/L (ref 19–32)
Chloride: 96 mEq/L (ref 96–112)
Creatinine, Ser: 5.94 mg/dL — ABNORMAL HIGH (ref 0.50–1.35)
GFR calc non Af Amer: 7 mL/min — ABNORMAL LOW (ref >90)
GFR, EST AFRICAN AMERICAN: 9 mL/min — AB (ref >90)
Glucose, Bld: 103 mg/dL — ABNORMAL HIGH (ref 70–99)
POTASSIUM: 5.4 meq/L — AB (ref 3.7–5.3)
Phosphorus: 5 mg/dL — ABNORMAL HIGH (ref 2.3–4.6)
SODIUM: 134 meq/L — AB (ref 137–147)

## 2014-04-28 LAB — CBC
HCT: 27.4 % — ABNORMAL LOW (ref 39.0–52.0)
Hemoglobin: 9 g/dL — ABNORMAL LOW (ref 13.0–17.0)
MCH: 29.2 pg (ref 26.0–34.0)
MCHC: 32.8 g/dL (ref 30.0–36.0)
MCV: 89 fL (ref 78.0–100.0)
Platelets: 282 10*3/uL (ref 150–400)
RBC: 3.08 MIL/uL — ABNORMAL LOW (ref 4.22–5.81)
RDW: 13.1 % (ref 11.5–15.5)
WBC: 24.7 10*3/uL — ABNORMAL HIGH (ref 4.0–10.5)

## 2014-04-28 LAB — HEPATIC FUNCTION PANEL
ALBUMIN: 1.8 g/dL — AB (ref 3.5–5.2)
ALT: 11 U/L (ref 0–53)
AST: 24 U/L (ref 0–37)
Alkaline Phosphatase: 45 U/L (ref 39–117)
BILIRUBIN TOTAL: 0.3 mg/dL (ref 0.3–1.2)
Total Protein: 6.7 g/dL (ref 6.0–8.3)

## 2014-04-28 MED ORDER — HEPARIN SODIUM (PORCINE) 1000 UNIT/ML DIALYSIS: 1000.0000 [IU] | INTRAMUSCULAR | Status: DC | PRN

## 2014-04-28 MED ORDER — LIDOCAINE-PRILOCAINE 2.5-2.5 % EX CREA: 1.0000 "application " | TOPICAL_CREAM | CUTANEOUS | Status: DC | PRN

## 2014-04-28 MED ORDER — DARBEPOETIN ALFA 150 MCG/0.3ML IJ SOSY
PREFILLED_SYRINGE | INTRAMUSCULAR | Status: AC
  Administered 2014-04-28: 150 ug via INTRAVENOUS
  Filled 2014-04-28: qty 0.3

## 2014-04-28 MED ORDER — SODIUM CHLORIDE 0.9 % IV SOLN: 100.0000 mL | INTRAVENOUS | Status: DC | PRN

## 2014-04-28 MED ORDER — ALTEPLASE 2 MG IJ SOLR
2.0000 mg | Freq: Once | INTRAMUSCULAR | Status: DC | PRN
  Filled 2014-04-28: qty 2

## 2014-04-28 MED ORDER — NEPRO/CARBSTEADY PO LIQD: 237.0000 mL | ORAL | Status: DC | PRN

## 2014-04-28 MED ORDER — HEPARIN SODIUM (PORCINE) 1000 UNIT/ML DIALYSIS
100.0000 [IU]/kg | INTRAMUSCULAR | Status: DC | PRN
  Filled 2014-04-28: qty 9

## 2014-04-28 MED ORDER — BORTEZOMIB CHEMO SQ INJECTION 3.5 MG (2.5MG/ML)
1.3000 mg/m2 | Freq: Once | INTRAMUSCULAR | Status: AC
  Administered 2014-04-28: 2.75 mg via SUBCUTANEOUS
  Filled 2014-04-28: qty 1.1

## 2014-04-28 MED ORDER — ONDANSETRON HCL 8 MG PO TABS
8.0000 mg | ORAL_TABLET | Freq: Once | ORAL | Status: AC
  Administered 2014-04-28: 8 mg via ORAL
  Filled 2014-04-28: qty 1

## 2014-04-28 MED ORDER — PENTAFLUOROPROP-TETRAFLUOROETH EX AERO: 1.0000 "application " | INHALATION_SPRAY | CUTANEOUS | Status: DC | PRN

## 2014-04-28 MED ORDER — LIDOCAINE HCL (PF) 1 % IJ SOLN: 5.0000 mL | INTRAMUSCULAR | Status: DC | PRN

## 2014-04-28 NOTE — Progress Notes (Signed)
Subjective: Interval History: has complaints tired of being here. .  Objective: Vital signs in last 24 hours: Temp:  [97.6 F (36.4 C)-98.6 F (37 C)] 97.6 F (36.4 C) (12/18 0636) Pulse Rate:  [53-66] 63 (12/18 0900) Resp:  [14-18] 14 (12/18 0636) BP: (123-191)/(57-96) 123/64 mmHg (12/18 0900) SpO2:  [96 %-98 %] 97 % (12/18 0636) Weight:  [83.3 kg (183 lb 10.3 oz)-84.9 kg (187 lb 2.7 oz)] 84.9 kg (187 lb 2.7 oz) (12/18 0636) Weight change: -3.1 kg (-6 lb 13.4 oz)  Intake/Output from previous day: 12/17 0701 - 12/18 0700 In: 480 [P.O.:480] Out: 250 [Urine:250] Intake/Output this shift:    General appearance: alert, cooperative, moderately obese and pale Resp: rales bibasilar Chest wall: RIJ PC Cardio: S1, S2 normal and systolic murmur: systolic ejection 3/6, decrescendo at 2nd left intercostal space GI: Pos bs,liver down  4 cm Extremities: extremities normal, atraumatic, no cyanosis or edema  Lab Results:  Recent Labs  04/26/14 0554 04/28/14 0500  WBC 19.2* 24.7*  HGB 9.8* 9.0*  HCT 29.0* 27.4*  PLT 320 282   BMET:  Recent Labs  04/27/14 0430 04/28/14 0500  NA 131* 134*  K 5.1 5.4*  CL 94* 96  CO2 22 25  GLUCOSE 128* 103*  BUN 72* 86*  CREATININE 5.07* 5.94*  CALCIUM 7.8* 8.1*    Recent Labs  04/25/14 1418  PTH 276*   Iron Studies: No results for input(s): IRON, TIBC, TRANSFERRIN, FERRITIN in the last 72 hours.  Studies/Results: No results found.  I have reviewed the patient's current medications.  Assessment/Plan: 1  AKI nonoliguric. For HD, will follow pre HD chem.  Will check on Sun. 2 MM for chemo 3 GERD 4 Anemia epo 5 HPTH Vit D P HD, epo,  Follow chem, Chemo.  counsel    LOS: 10 days   Kyara Boxer L 04/28/2014,9:18 AM

## 2014-04-28 NOTE — Telephone Encounter (Signed)
[facility], pt son-in-law, would like to know when Dr Lindi Adie will be in to see patient - reports pt does not remember everything and they would like to have a family member there.  Per Dr. Lindi Adie, he will see the patient tonight but cannot promise a specific time.  Mr. Edwinna Areola voiced understanding.

## 2014-04-28 NOTE — Procedures (Signed)
I was present at this session.  I have reviewed the session itself and made appropriate changes. HD via R IJ PC.  bp tol HD. 3rd HD  Mariely Mahr L 12/18/20159:16 AM

## 2014-04-28 NOTE — Progress Notes (Signed)
PATIENT DETAILS Name: Miguel Compton Age: 80 y.o. Sex: male Date of Birth: 02-Nov-1921 Admit Date: 04/18/2014 Admitting Physician Costin Karlyne Greenspan, MD MAU:[id number], Jenny Reichmann, MD  Brief Narrative: 80 year old highly functional male with history of COPD, hypertension, dyslipidemia and mild early dementia along with CVA in the past was admitted from PCP office for worsening renal failure. Patient was recently diagnosed with multiple myeloma. Initially thought to have renal failure from multiple myeloma, however preliminary pathology from kidney biopsy negative for this. Also underwent bone marrow biopsy which is currently pending report. Continues to have significant renal dysfunction, being followed by both nephrology and oncology. Has been started on Decadron and Velcade by oncology. Nephrology following and patient has undergone hemodialysis on 04/22/14, current plans are to follow renal function with prn dialysis and close monitoring  Subjective: No major issues overnight.Continues to be frustrated regarding prolonged hospital stay  Assessment/Plan: Principal Problem: LKT:[id number] suspicion for myeloma kidney, however biopsy negative myeloma cast nephropathy.Etiology of renal failure unclear, underwent first hemodialysis 12/12, and second HD on 12/16. Nephrology hopeful for eventual return of renal function, however may require further HD depending on renal function/output. Continue to monitor and closely follow.  Active Problems: Multiple myeloma:Hem/Onc consulted, underwent bone marrow biopsy on 04/21/14, pathology confirms myeloma.Has been started on Decadron and Velcade.Oncology following-Dr Lindi Adie planning chemoTx on 12/18-and every friday  History of CVA:Non focal exam, ASA held for renal bx-has been resumed  Dyslipidemia: On statin -continue.  Mild dementia: On Aricept.Currently awake/alert X4  Essential hypertension: BP moderatly controlled, continue Coreg, Amlodipine  discontinued-as vol being removed with HD. Marland Kitchen   Constipation: continue  miralax,senokot.Prn Lactulose. Having almost daily BM's  Leukocytosis: Likely due to steroid use and possible multiple myeloma. No fever no signs of infection.Monitor off antibiotics-UA neg for UTI, CXR-+coarse infiltrates (chronic). Monitor CBC periodically, if febrile blood culture will be obtained.  Disposition: Remain inpatient  Antibiotics:  See below  Anti-infectives    Start     Dose/Rate Route Frequency Ordered Stop   04/21/14 2200  acyclovir (ZOVIRAX) 200 MG capsule 400 mg     400 mg Oral 2 times daily 04/21/14 1658     04/21/14 0000  acyclovir (ZOVIRAX) 400 MG tablet     400 mg Oral 2 times daily 04/21/14 1659     04/19/14 0930  ceFAZolin (ANCEF) IVPB 2 g/50 mL premix    Comments:  Hang ON CALL to xray 12/9   2 g100 mL/hr over 30 Minutes Intravenous On call 04/19/14 0917 04/19/14 1133      DVT Prophylaxis: Prophylactic  Heparin  Code Status: Full code   Family Communication Son in law at bedside on 12/17  Procedures:  None  CONSULTS:  nephrology and hematology/oncology   MEDICATIONS: Scheduled Meds: . acyclovir  400 mg Oral BID  . aspirin  81 mg Oral Daily  . calcitRIOL  0.5 mcg Oral Q M,W,F-HD  . carvedilol  3.125 mg Oral BID WC  . darbepoetin (ARANESP) injection - DIALYSIS  150 mcg Intravenous Q Fri-HD  . dexamethasone  20 mg Oral Weekly  . feeding supplement (NEPRO CARB STEADY)  237 mL Oral Q1500  . fluticasone  1 puff Inhalation BID  . heparin subcutaneous  5,000 Units Subcutaneous 3 times per day  . lanthanum  1,000 mg Oral TID WC  . multivitamin  1 tablet Oral QHS  . pantoprazole  40 mg Oral Daily  . polyethylene glycol  17 g Oral BID  .  pravastatin  80 mg Oral q1800  . senna  2 tablet Oral BID  . sodium chloride  3 mL Intravenous Q12H   Continuous Infusions:  PRN Meds:.albuterol, ALPRAZolam, bisacodyl, HYDROcodone-acetaminophen, lactulose,  prochlorperazine    PHYSICAL EXAM: Vital signs in last 24 hours: Filed Vitals:   04/28/14 0900 04/28/14 0930 04/28/14 0945 04/28/14 1016  BP: 123/64 133/68 134/60 136/55  Pulse: 63 61 62 63  Temp:   98.1 F (36.7 C) 97.8 F (36.6 C)  TempSrc:   Oral Oral  Resp:   16 12  Height:      Weight:   82.9 kg (182 lb 12.2 oz)   SpO2:   98% 95%    Weight change: -3.1 kg (-6 lb 13.4 oz) Filed Weights   04/27/14 2100 04/28/14 0636 04/28/14 0945  Weight: 83.3 kg (183 lb 10.3 oz) 84.9 kg (187 lb 2.7 oz) 82.9 kg (182 lb 12.2 oz)   Body mass index is 26.22 kg/(m^2).   Gen Exam: Awake and alert with clear speech.   Neck: Supple, No JVD.   Chest: B/L Clear.  No rales or rhonchi CVS: S1 S2 Regular, no murmurs.  Abdomen: soft, BS +, non tender, non distended.  Extremities: no edema, lower extremities warm to touch. Neurologic: Non Focal.   Skin: No Rash.   Wounds: N/A.    Intake/Output from previous day:  Intake/Output Summary (Last 24 hours) at 04/28/14 1357 Last data filed at 04/28/14 0945  Gross per 24 hour  Intake      0 ml  Output   2000 ml  Net  -2000 ml     LAB RESULTS: CBC  Recent Labs Lab 04/22/14 0417 04/23/14 0500 04/24/14 0442 04/26/14 0554 04/28/14 0500  WBC 18.3* 18.4* 17.0* 19.2* 24.7*  HGB 9.2* 9.5* 8.8* 9.8* 9.0*  HCT 26.5* 27.0* 25.8* 29.0* 27.4*  PLT 280 275 250 320 282  MCV 85.8 86.3 86.6 87.6 89.0  MCH 29.8 30.4 29.5 29.6 29.2  MCHC 34.7 35.2 34.1 33.8 32.8  RDW 13.0 13.1 13.1 13.0 13.1    Chemistries   Recent Labs Lab 04/24/14 0442 04/25/14 0520 04/26/14 0554 04/27/14 0430 04/28/14 0500  NA 134* 132* 132* 131* 134*  K 4.9 5.3 5.1 5.1 5.4*  CL 95* 94* 94* 94* 96  CO2 21 20 17* 22 25  GLUCOSE 123* 153* 118* 128* 103*  BUN 98* 107* 111* 72* 86*  CREATININE 7.12* 7.31* 7.46* 5.07* 5.94*  CALCIUM 7.1* 7.3* 7.7* 7.8* 8.1*    CBG: No results for input(s): GLUCAP in the last 168 hours.  GFR Estimated Creatinine Clearance: 8.5 mL/min  (by C-G formula based on Cr of 5.94).  Coagulation profile No results for input(s): INR, PROTIME in the last 168 hours.  Cardiac Enzymes No results for input(s): CKMB, TROPONINI, MYOGLOBIN in the last 168 hours.  Invalid input(s): CK  Invalid input(s): POCBNP No results for input(s): DDIMER in the last 72 hours. No results for input(s): HGBA1C in the last 72 hours. No results for input(s): CHOL, HDL, LDLCALC, TRIG, CHOLHDL, LDLDIRECT in the last 72 hours. No results for input(s): TSH, T4TOTAL, T3FREE, THYROIDAB in the last 72 hours.  Invalid input(s): FREET3 No results for input(s): VITAMINB12, FOLATE, FERRITIN, TIBC, IRON, RETICCTPCT in the last 72 hours. No results for input(s): LIPASE, AMYLASE in the last 72 hours.  Urine Studies No results for input(s): UHGB, CRYS in the last 72 hours.  Invalid input(s): UACOL, UAPR, USPG, UPH, UTP, UGL, UKET, UBIL, UNIT, UROB,  ULEU, UEPI, UWBC, URBC, UBAC, CAST, UCOM, BILUA  MICROBIOLOGY: No results found for this or any previous visit (from the past 240 hour(s)).  RADIOLOGY STUDIES/RESULTS: Dg Chest 2 View  04/18/2014   CLINICAL DATA:  ABNORMAL LAB, shortness of breath, COPD  EXAM: CHEST - 2 VIEW  COMPARISON:  08/25/2011  FINDINGS: Coarse bilateral interstitial opacities left greater than right, with relative sparing of the apices, increased since previous exam. Heart size is normal. No effusion. Visualized skeletal structures are unremarkable.  IMPRESSION: 1. Progressive coarse bilateral asymmetric interstitial opacities, probably largely chronic.   Electronically Signed   By: Arne Cleveland M.D.   On: 04/18/2014 15:36   Ir Fluoro Guide Cv Line Right  04/19/2014   CLINICAL DATA:  Acute renal insufficiency, needs access for hemodialysis  EXAM: TUNNELED HEMODIALYSIS CATHETER PLACEMENT WITH ULTRASOUND AND FLUOROSCOPIC GUIDANCE  TECHNIQUE: The procedure, risks, benefits, and alternatives were explained to the patient. Questions regarding the  procedure were encouraged and answered. The patient understands and consents to the procedure. As antibiotic prophylaxis, cefazolin 2 g was ordered pre-procedure and administered intravenously within one hour of incision.Patency of the right IJ vein was confirmed with ultrasound with image documentation. An appropriate skin site was determined. Region was prepped using maximum barrier technique including cap and mask, sterile gown, sterile gloves, large sterile sheet, and Chlorhexidine as cutaneous antisepsis. The region was infiltrated locally with 1% lidocaine.  Intravenous Fentanyl and Versed were administered as conscious sedation during continuous cardiorespiratory monitoring by the radiology RN, with a total moderate sedation time of 25 minutes.  Under real-time ultrasound guidance, the right IJ vein was accessed with a 21 gauge micropuncture needle; the needle tip within the vein was confirmed with ultrasound image documentation. Needle exchanged over the 018 guidewire for transitional dilator, which allowed advancement of a Benson wire into the IVC. Over this, an MPA catheter was advanced. A Hemosplit 19 hemodialysis catheter was tunneled from the right anterior chest wall approach to the right IJ dermatotomy site. The MPA catheter was exchanged over an Amplatz wire for serial vascular dilators which allow placement of a peel-away sheath, through which the catheter was advanced under intermittent fluoroscopy, positioned with its tips in the proximal and midright atrium. Spot chest radiograph confirms good catheter position. No pneumothorax. Catheter was flushed and primed per protocol. Catheter secured externally with O Prolene sutures. The right IJ dermatotomy site was closed with Dermabond.  COMPLICATIONS: COMPLICATIONS None immediate  FLUOROSCOPY TIME:  48 seconds  COMPARISON:  None  IMPRESSION: 1. Technically successful placement of tunneled right IJ hemodialysis catheter with ultrasound and fluoroscopic  guidance. Ready for routine use.  ACCESS: Remains approachable for percutaneous intervention as needed.   Electronically Signed   By: Arne Cleveland M.D.   On: 04/19/2014 12:10   Ir US Guide Vasc Access Right  04/19/2014   CLINICAL DATA:  Acute renal insufficiency, needs access for hemodialysis  EXAM: TUNNELED HEMODIALYSIS CATHETER PLACEMENT WITH ULTRASOUND AND FLUOROSCOPIC GUIDANCE  TECHNIQUE: The procedure, risks, benefits, and alternatives were explained to the patient. Questions regarding the procedure were encouraged and answered. The patient understands and consents to the procedure. As antibiotic prophylaxis, cefazolin 2 g was ordered pre-procedure and administered intravenously within one hour of incision.Patency of the right IJ vein was confirmed with ultrasound with image documentation. An appropriate skin site was determined. Region was prepped using maximum barrier technique including cap and mask, sterile gown, sterile gloves, large sterile sheet, and Chlorhexidine as cutaneous antisepsis. The region was  infiltrated locally with 1% lidocaine.  Intravenous Fentanyl and Versed were administered as conscious sedation during continuous cardiorespiratory monitoring by the radiology RN, with a total moderate sedation time of 25 minutes.  Under real-time ultrasound guidance, the right IJ vein was accessed with a 21 gauge micropuncture needle; the needle tip within the vein was confirmed with ultrasound image documentation. Needle exchanged over the 018 guidewire for transitional dilator, which allowed advancement of a Benson wire into the IVC. Over this, an MPA catheter was advanced. A Hemosplit 19 hemodialysis catheter was tunneled from the right anterior chest wall approach to the right IJ dermatotomy site. The MPA catheter was exchanged over an Amplatz wire for serial vascular dilators which allow placement of a peel-away sheath, through which the catheter was advanced under intermittent fluoroscopy,  positioned with its tips in the proximal and midright atrium. Spot chest radiograph confirms good catheter position. No pneumothorax. Catheter was flushed and primed per protocol. Catheter secured externally with O Prolene sutures. The right IJ dermatotomy site was closed with Dermabond.  COMPLICATIONS: COMPLICATIONS None immediate  FLUOROSCOPY TIME:  48 seconds  COMPARISON:  None  IMPRESSION: 1. Technically successful placement of tunneled right IJ hemodialysis catheter with ultrasound and fluoroscopic guidance. Ready for routine use.  ACCESS: Remains approachable for percutaneous intervention as needed.   Electronically Signed   By: Arne Cleveland M.D.   On: 04/19/2014 12:10   Ir US Guide Bx Asp/drain  04/19/2014   CLINICAL DATA:  Acute renal insufficiency  COMPARISON:  CT 03/21/2008  TECHNIQUE: ULTRASOUND GUIDED RENAL CORE BIOPSY  Survey ultrasound was performed and an appropriate skin entry site was localized. Site was marked, prepped with Betadine, draped in usual sterile fashion, infiltrated locally with 1% lidocaine. Intravenous Fentanyl and Versed were administered as conscious sedation during continuous cardiorespiratory monitoring by the radiology RN with a total moderate sedation time of 25 minutes.  Under real time ultrasound guidance, a 15 gauge trocar needle was advanced to the margin of the lower pole left kidney. The 16 gauge core needle was coaxially advanced for 3 passes. The core samples were submitted to pathology. The patient tolerated procedure well.  COMPLICATIONS: COMPLICATIONS none  IMPRESSION: 1. Technically successful ultrasound-guided core renal biopsy , lower pole left kidney.   Electronically Signed   By: Arne Cleveland M.D.   On: 04/19/2014 12:12   Ct Biopsy  04/21/2014   CLINICAL DATA:  Multiple myeloma  EXAM: CT GUIDED RIGHT ILIAC BONE MARROW ASPIRATION AND CORE BIOPSY  Date:  12/11/201512/03/2014 10:04 am  Radiologist:  M. Daryll Brod, MD  Guidance:  CT  FLUOROSCOPY TIME:   NONE.  MEDICATIONS AND MEDICAL HISTORY: 1 mg Versed, 50 mcg fentanyl  ANESTHESIA/SEDATION: 15 min  CONTRAST:  None.  COMPLICATIONS: None immediate  PROCEDURE: Informed consent was obtained from the patient following explanation of the procedure, risks, benefits and alternatives. The patient understands, agrees and consents for the procedure. All questions were addressed. A time out was performed.  The patient was positioned prone and noncontrast localization CT was performed of the pelvis to demonstrate the iliac marrow spaces.  Maximal barrier sterile technique utilized including caps, mask, sterile gowns, sterile gloves, large sterile drape, hand hygiene, and betadine prep.  Under sterile conditions and local anesthesia, an 11 gauge coaxial bone biopsy needle was advanced into the right iliac marrow space. Needle position was confirmed with CT imaging. Initially, bone marrow aspiration was performed. Next, the 11 gauge outer cannula was utilized to obtain a right iliac  bone marrow core biopsy. Needle was removed. Hemostasis was obtained with compression. The patient tolerated the procedure well. Samples were prepared with the cytotechnologist. No immediate complications.  IMPRESSION: CT guided right iliac bone marrow aspiration and core biopsy.   Electronically Signed   By: Daryll Brod M.D.   On: 04/21/2014 10:13   Dg Bone Survey Met  04/20/2014   CLINICAL DATA:  80 year old male with multiple myeloma.  EXAM: METASTATIC BONE SURVEY  COMPARISON:  Recent prior osseous survey 04/13/2014  FINDINGS: Chest: No discrete lytic lesion in the visualized bones. Right IJ approach tunneled hemodialysis/pheresis catheter. The catheter tips project over the mid SVC. Mild cardiomegaly. Moderately large hiatal hernia. Atherosclerotic calcification noted in the transverse aorta. Stable appearance of left lingular opacity, background bronchitic changes and interstitial prominence. No new acute cardiopulmonary finding.  Spine:  Limited visualization of the cervical spine beyond the level of C6 secondary to overlap of the shoulder girdles. Multilevel cervical spondylosis. Right worse than left facet arthropathy. No discrete lytic osseous lesion. Atherosclerotic calcification present in the region of the left carotid bifurcation. Multilevel degenerative spurring throughout the thoracic spine. No evidence of thoracic compression fracture. Degenerative spurring continues to the lumbar spine. There is moderate facet arthropathy at L4-L5 and L5-S1. No discrete lytic lesion.  Skull: No discrete osseous lesion. Normal aeration of the paranasal sinuses.  Upper extremities: No discrete lytic osseous lesion. No acute fracture or malalignment. Nonunion of remote healed fracture involving the right ulnar styloid.  Pelvis: No discrete lytic osseous lesion. The bones are mildly osteopenic. Brachy therapy seeds project over the prostate gland. Mild bilateral hip joint osteoarthritis.  Lower extremities: No discrete lytic lesion. Mild scattered atherosclerotic vascular calcifications.  IMPRESSION: No discrete lytic lesion to suggest plasmacytoma or focal multiple myeloma.  Additional ancillary findings as above.   Electronically Signed   By: Jacqulynn Cadet M.D.   On: 04/20/2014 10:50    Oren Binet, MD  [facility] Pager:[phone]  If 7PM-7AM, please contact night-coverage [email or web address] Password [email or web address] 04/28/2014, 1:57 PM   LOS: 10 days

## 2014-04-28 NOTE — Telephone Encounter (Signed)
, °

## 2014-04-28 NOTE — Progress Notes (Signed)
SUBJECTIVE: Patient is sitting up in chair, undergoing hemodialysis and Velcade injection today.  OBJECTIVE PHYSICAL EXAMINATION: ECOG PERFORMANCE STATUS: 2 - Symptomatic, <50% confined to bed  Filed Vitals:   04/28/14 1016  BP: 136/55  Pulse: 63  Temp: 97.8 F (36.6 C)  Resp: 12   Filed Weights   04/27/14 2100 04/28/14 0636 04/28/14 0945  Weight: 183 lb 10.3 oz (83.3 kg) 187 lb 2.7 oz (84.9 kg) 182 lb 12.2 oz (82.9 kg)    GENERAL:alert, no distress and comfortable SKIN: skin color, texture, turgor are normal, no rashes or significant lesions EYES: normal, Conjunctiva are pink and non-injected, sclera clear OROPHARYNX:no exudate, no erythema and lips, buccal mucosa, and tongue normal  LUNGS: clear to auscultation and percussion with normal breathing effort HEART: regular rate & rhythm and no murmurs and no lower extremity edema ABDOMEN:abdomen soft, non-tender and normal bowel sounds Musculoskeletal:no cyanosis of digits and no clubbing  NEURO: alert & oriented x 3 with fluent speech, no focal motor/sensory deficits  LABORATORY DATA:  I have reviewed the data as listed _0 @  Lab Results  Component Value Date   WBC 24.7* 04/28/2014   HGB 9.0* 04/28/2014   HCT 27.4* 04/28/2014   MCV 89.0 04/28/2014   PLT 282 04/28/2014   NEUTROABS 13.4* 04/18/2014    ASSESSMENT AND PLAN:  1. Multiple myeloma IgG lambda, bone marrow biopsy showed 29% plasma cells with acute renal failure: Patient is currently on hemodialysis Monday Wednesday and Friday. I started him on Velcade Decadron. Today his second dose of Velcade. He appears to be tolerating Velcade extremely well.  I discussed with the family extensively that it is too soon for Velcade to show response. I will obtain a serum protein electrophoresis and Kappa lambda ratio even though it is only 2 doses of Velcade. I do not anticipate any major change in his light chains. But the patient really wanted to find out and hence  we're ordering it.  Bone survey did not show any lytic bone lesions.  Our plan is to continue Velcade treatments once a week on Fridays. 2. Anemia related to multiple myeloma and renal failure: Stable at 9 g. Patient does not have any evidence of hypercalcemia.  Continue the same current treatment plan and I will follow him periodically during his hospitalization.

## 2014-04-29 LAB — RENAL FUNCTION PANEL
ALBUMIN: 1.8 g/dL — AB (ref 3.5–5.2)
Albumin: 1.9 g/dL — ABNORMAL LOW (ref 3.5–5.2)
Anion gap: 15 (ref 5–15)
Anion gap: 20 — ABNORMAL HIGH (ref 5–15)
BUN: 73 mg/dL — ABNORMAL HIGH (ref 6–23)
BUN: 86 mg/dL — ABNORMAL HIGH (ref 6–23)
CALCIUM: 8.2 mg/dL — AB (ref 8.4–10.5)
CO2: 23 mEq/L (ref 19–32)
CO2: 21 meq/L (ref 19–32)
Calcium: 8.2 mg/dL — ABNORMAL LOW (ref 8.4–10.5)
Chloride: 94 mEq/L — ABNORMAL LOW (ref 96–112)
Chloride: 94 mEq/L — ABNORMAL LOW (ref 96–112)
Creatinine, Ser: 5.38 mg/dL — ABNORMAL HIGH (ref 0.50–1.35)
Creatinine, Ser: 6.08 mg/dL — ABNORMAL HIGH (ref 0.50–1.35)
GFR calc non Af Amer: 7 mL/min — ABNORMAL LOW (ref >90)
GFR, EST AFRICAN AMERICAN: 10 mL/min — AB (ref >90)
GFR, EST AFRICAN AMERICAN: 8 mL/min — AB (ref >90)
GFR, EST NON AFRICAN AMERICAN: 8 mL/min — AB (ref >90)
GLUCOSE: 90 mg/dL (ref 70–99)
GLUCOSE: 163 mg/dL — AB (ref 70–99)
POTASSIUM: 5.8 meq/L — AB (ref 3.7–5.3)
Phosphorus: 5.8 mg/dL — ABNORMAL HIGH (ref 2.3–4.6)
Phosphorus: 7.4 mg/dL — ABNORMAL HIGH (ref 2.3–4.6)
Potassium: 5 mEq/L (ref 3.7–5.3)
SODIUM: 132 meq/L — AB (ref 137–147)
Sodium: 135 mEq/L — ABNORMAL LOW (ref 137–147)

## 2014-04-29 MED ORDER — SODIUM POLYSTYRENE SULFONATE 15 GM/60ML PO SUSP
50.0000 g | Freq: Once | ORAL | Status: AC
  Administered 2014-04-29: 50 g via ORAL
  Filled 2014-04-29: qty 240

## 2014-04-29 NOTE — Progress Notes (Signed)
PATIENT DETAILS Name: Miguel Compton Age: 80 y.o. Sex: male Date of Birth: 1922/04/12 Admit Date: 04/18/2014 Admitting Physician Costin Karlyne Greenspan, MD JQG:BEEF, Jenny Reichmann, MD  Brief Narrative: 80 year old highly functional male with history of COPD, hypertension, dyslipidemia and mild early dementia along with CVA in the past was admitted from PCP office for worsening renal failure. Patient was recently diagnosed with multiple myeloma. Initially thought to have renal failure from multiple myeloma, however preliminary pathology from kidney biopsy negative for this. Also underwent bone marrow biopsy which is currently pending report. Continues to have significant renal dysfunction, being followed by both nephrology and oncology. Has been started on Decadron and Velcade by oncology. Nephrology following and patient has undergone hemodialysis on 04/22/14, current plans are to follow renal function with prn dialysis and close monitoring  Subjective: No major issues overnight.Had BM yesterday  Assessment/Plan: Principal Problem: EOF:HQRFXJOIT suspicion for myeloma kidney, however biopsy negative myeloma cast nephropathy.Etiology of renal failure unclear, underwent first hemodialysis 12/12, and second HD on 12/16. Nephrology hopeful for eventual return of renal function, however may require further HD depending on renal function/output. Continue to monitor and closely follow.  Active Problems: Mild Hyperkalemia: Will treat with Kayexalate, repeat chemistry panel later today.   Multiple myeloma:Hem/Onc consulted, underwent bone marrow biopsy on 04/21/14, pathology confirms myeloma.Has been started on Decadron and Velcade.Oncology following-Dr Lindi Adie planning chemoTx  every Friday (recieved 2 cycles so far)  History of CVA:Non focal exam, ASA held for renal bx-has been resumed  Dyslipidemia: On statin -continue.  Mild dementia: On Aricept.Currently awake/alert X4  Essential hypertension: BP  moderatly controlled, continue Coreg, Amlodipine discontinued-as vol being removed with HD. Marland Kitchen   Constipation: continue  miralax,senokot.Prn Lactulose. Having almost daily BM's  Leukocytosis: Likely due to steroid use and possible multiple myeloma. No fever no signs of infection.Monitor off antibiotics-UA neg for UTI, CXR-+coarse infiltrates (chronic). Monitor CBC periodically, if febrile blood culture will be obtained.  Disposition: Remain inpatient  Antibiotics:  See below  Anti-infectives    Start     Dose/Rate Route Frequency Ordered Stop   04/21/14 2200  acyclovir (ZOVIRAX) 200 MG capsule 400 mg     400 mg Oral 2 times daily 04/21/14 1658     04/21/14 0000  acyclovir (ZOVIRAX) 400 MG tablet     400 mg Oral 2 times daily 04/21/14 1659     04/19/14 0930  ceFAZolin (ANCEF) IVPB 2 g/50 mL premix    Comments:  Hang ON CALL to xray 12/9   2 g100 mL/hr over 30 Minutes Intravenous On call 04/19/14 0917 04/19/14 1133      DVT Prophylaxis: Prophylactic  Heparin  Code Status: Full code   Family Communication Son in law at bedside on 12/17  Procedures:  None  CONSULTS:  nephrology and hematology/oncology   MEDICATIONS: Scheduled Meds: . acyclovir  400 mg Oral BID  . aspirin  81 mg Oral Daily  . calcitRIOL  0.5 mcg Oral Q M,W,F-HD  . carvedilol  3.125 mg Oral BID WC  . darbepoetin (ARANESP) injection - DIALYSIS  150 mcg Intravenous Q Fri-HD  . dexamethasone  20 mg Oral Weekly  . feeding supplement (NEPRO CARB STEADY)  237 mL Oral Q1500  . fluticasone  1 puff Inhalation BID  . heparin subcutaneous  5,000 Units Subcutaneous 3 times per day  . lanthanum  1,000 mg Oral TID WC  . multivitamin  1 tablet Oral QHS  . pantoprazole  40 mg  Oral Daily  . polyethylene glycol  17 g Oral BID  . pravastatin  80 mg Oral q1800  . senna  2 tablet Oral BID  . sodium chloride  3 mL Intravenous Q12H  . sodium polystyrene  50 g Oral Once   Continuous Infusions:  PRN Meds:.albuterol,  ALPRAZolam, bisacodyl, HYDROcodone-acetaminophen, lactulose, prochlorperazine    PHYSICAL EXAM: Vital signs in last 24 hours: Filed Vitals:   04/28/14 1016 04/28/14 2026 04/28/14 2107 04/29/14 0612  BP: 136/55  143/63 149/57  Pulse: 63  65 59  Temp: 97.8 F (36.6 C)  98.7 F (37.1 C) 97.8 F (36.6 C)  TempSrc: Oral  Oral Oral  Resp: $Remo'12  16 16  'SnSKM$ Height:      Weight:   82.901 kg (182 lb 12.2 oz)   SpO2: 95% 96% 95% 95%    Weight change: -0.4 kg (-14.1 oz) Filed Weights   04/28/14 0636 04/28/14 0945 04/28/14 2107  Weight: 84.9 kg (187 lb 2.7 oz) 82.9 kg (182 lb 12.2 oz) 82.901 kg (182 lb 12.2 oz)   Body mass index is 26.22 kg/(m^2).   Gen Exam: Awake and alert with clear speech.   Neck: Supple, No JVD.   Chest: B/L Clear.  No rales or rhonchi CVS: S1 S2 Regular, no murmurs.  Abdomen: soft, BS +, non tender, non distended.  Extremities: no edema, lower extremities warm to touch. Neurologic: Non Focal.   Skin: No Rash.   Wounds: N/A.    Intake/Output from previous day:  Intake/Output Summary (Last 24 hours) at 04/29/14 1048 Last data filed at 04/28/14 2212  Gross per 24 hour  Intake    360 ml  Output    200 ml  Net    160 ml     LAB RESULTS: CBC  Recent Labs Lab 04/23/14 0500 04/24/14 0442 04/26/14 0554 04/28/14 0500  WBC 18.4* 17.0* 19.2* 24.7*  HGB 9.5* 8.8* 9.8* 9.0*  HCT 27.0* 25.8* 29.0* 27.4*  PLT 275 250 320 282  MCV 86.3 86.6 87.6 89.0  MCH 30.4 29.5 29.6 29.2  MCHC 35.2 34.1 33.8 32.8  RDW 13.1 13.1 13.0 13.1    Chemistries   Recent Labs Lab 04/25/14 0520 04/26/14 0554 04/27/14 0430 04/28/14 0500 04/29/14 0445  NA 132* 132* 131* 134* 132*  K 5.3 5.1 5.1 5.4* 5.8*  CL 94* 94* 94* 96 94*  CO2 20 17* $Remo'22 25 23  'hlGHL$ GLUCOSE 153* 118* 128* 103* 90  BUN 107* 111* 72* 86* 73*  CREATININE 7.31* 7.46* 5.07* 5.94* 5.38*  CALCIUM 7.3* 7.7* 7.8* 8.1* 8.2*    CBG: No results for input(s): GLUCAP in the last 168 hours.  GFR Estimated  Creatinine Clearance: 9.4 mL/min (by C-G formula based on Cr of 5.38).  Coagulation profile No results for input(s): INR, PROTIME in the last 168 hours.  Cardiac Enzymes No results for input(s): CKMB, TROPONINI, MYOGLOBIN in the last 168 hours.  Invalid input(s): CK  Invalid input(s): POCBNP No results for input(s): DDIMER in the last 72 hours. No results for input(s): HGBA1C in the last 72 hours. No results for input(s): CHOL, HDL, LDLCALC, TRIG, CHOLHDL, LDLDIRECT in the last 72 hours. No results for input(s): TSH, T4TOTAL, T3FREE, THYROIDAB in the last 72 hours.  Invalid input(s): FREET3 No results for input(s): VITAMINB12, FOLATE, FERRITIN, TIBC, IRON, RETICCTPCT in the last 72 hours. No results for input(s): LIPASE, AMYLASE in the last 72 hours.  Urine Studies No results for input(s): UHGB, CRYS in the last 72  hours.  Invalid input(s): UACOL, UAPR, USPG, UPH, UTP, UGL, UKET, UBIL, UNIT, UROB, ULEU, UEPI, UWBC, URBC, UBAC, CAST, UCOM, BILUA  MICROBIOLOGY: No results found for this or any previous visit (from the past 240 hour(s)).  RADIOLOGY STUDIES/RESULTS: Dg Chest 2 View  04/18/2014   CLINICAL DATA:  ABNORMAL LAB, shortness of breath, COPD  EXAM: CHEST - 2 VIEW  COMPARISON:  08/25/2011  FINDINGS: Coarse bilateral interstitial opacities left greater than right, with relative sparing of the apices, increased since previous exam. Heart size is normal. No effusion. Visualized skeletal structures are unremarkable.  IMPRESSION: 1. Progressive coarse bilateral asymmetric interstitial opacities, probably largely chronic.   Electronically Signed   By: Arne Cleveland M.D.   On: 04/18/2014 15:36   Ir Fluoro Guide Cv Line Right  04/19/2014   CLINICAL DATA:  Acute renal insufficiency, needs access for hemodialysis  EXAM: TUNNELED HEMODIALYSIS CATHETER PLACEMENT WITH ULTRASOUND AND FLUOROSCOPIC GUIDANCE  TECHNIQUE: The procedure, risks, benefits, and alternatives were explained to the  patient. Questions regarding the procedure were encouraged and answered. The patient understands and consents to the procedure. As antibiotic prophylaxis, cefazolin 2 g was ordered pre-procedure and administered intravenously within one hour of incision.Patency of the right IJ vein was confirmed with ultrasound with image documentation. An appropriate skin site was determined. Region was prepped using maximum barrier technique including cap and mask, sterile gown, sterile gloves, large sterile sheet, and Chlorhexidine as cutaneous antisepsis. The region was infiltrated locally with 1% lidocaine.  Intravenous Fentanyl and Versed were administered as conscious sedation during continuous cardiorespiratory monitoring by the radiology RN, with a total moderate sedation time of 25 minutes.  Under real-time ultrasound guidance, the right IJ vein was accessed with a 21 gauge micropuncture needle; the needle tip within the vein was confirmed with ultrasound image documentation. Needle exchanged over the 018 guidewire for transitional dilator, which allowed advancement of a Benson wire into the IVC. Over this, an MPA catheter was advanced. A Hemosplit 19 hemodialysis catheter was tunneled from the right anterior chest wall approach to the right IJ dermatotomy site. The MPA catheter was exchanged over an Amplatz wire for serial vascular dilators which allow placement of a peel-away sheath, through which the catheter was advanced under intermittent fluoroscopy, positioned with its tips in the proximal and midright atrium. Spot chest radiograph confirms good catheter position. No pneumothorax. Catheter was flushed and primed per protocol. Catheter secured externally with O Prolene sutures. The right IJ dermatotomy site was closed with Dermabond.  COMPLICATIONS: COMPLICATIONS None immediate  FLUOROSCOPY TIME:  48 seconds  COMPARISON:  None  IMPRESSION: 1. Technically successful placement of tunneled right IJ hemodialysis catheter  with ultrasound and fluoroscopic guidance. Ready for routine use.  ACCESS: Remains approachable for percutaneous intervention as needed.   Electronically Signed   By: Arne Cleveland M.D.   On: 04/19/2014 12:10   Ir US Guide Vasc Access Right  04/19/2014   CLINICAL DATA:  Acute renal insufficiency, needs access for hemodialysis  EXAM: TUNNELED HEMODIALYSIS CATHETER PLACEMENT WITH ULTRASOUND AND FLUOROSCOPIC GUIDANCE  TECHNIQUE: The procedure, risks, benefits, and alternatives were explained to the patient. Questions regarding the procedure were encouraged and answered. The patient understands and consents to the procedure. As antibiotic prophylaxis, cefazolin 2 g was ordered pre-procedure and administered intravenously within one hour of incision.Patency of the right IJ vein was confirmed with ultrasound with image documentation. An appropriate skin site was determined. Region was prepped using maximum barrier technique including cap and mask, sterile  gown, sterile gloves, large sterile sheet, and Chlorhexidine as cutaneous antisepsis. The region was infiltrated locally with 1% lidocaine.  Intravenous Fentanyl and Versed were administered as conscious sedation during continuous cardiorespiratory monitoring by the radiology RN, with a total moderate sedation time of 25 minutes.  Under real-time ultrasound guidance, the right IJ vein was accessed with a 21 gauge micropuncture needle; the needle tip within the vein was confirmed with ultrasound image documentation. Needle exchanged over the 018 guidewire for transitional dilator, which allowed advancement of a Benson wire into the IVC. Over this, an MPA catheter was advanced. A Hemosplit 19 hemodialysis catheter was tunneled from the right anterior chest wall approach to the right IJ dermatotomy site. The MPA catheter was exchanged over an Amplatz wire for serial vascular dilators which allow placement of a peel-away sheath, through which the catheter was advanced  under intermittent fluoroscopy, positioned with its tips in the proximal and midright atrium. Spot chest radiograph confirms good catheter position. No pneumothorax. Catheter was flushed and primed per protocol. Catheter secured externally with O Prolene sutures. The right IJ dermatotomy site was closed with Dermabond.  COMPLICATIONS: COMPLICATIONS None immediate  FLUOROSCOPY TIME:  48 seconds  COMPARISON:  None  IMPRESSION: 1. Technically successful placement of tunneled right IJ hemodialysis catheter with ultrasound and fluoroscopic guidance. Ready for routine use.  ACCESS: Remains approachable for percutaneous intervention as needed.   Electronically Signed   By: Oley Balm M.D.   On: 04/19/2014 12:10   Ir US Guide Bx Asp/drain  04/19/2014   CLINICAL DATA:  Acute renal insufficiency  COMPARISON:  CT 03/21/2008  TECHNIQUE: ULTRASOUND GUIDED RENAL CORE BIOPSY  Survey ultrasound was performed and an appropriate skin entry site was localized. Site was marked, prepped with Betadine, draped in usual sterile fashion, infiltrated locally with 1% lidocaine. Intravenous Fentanyl and Versed were administered as conscious sedation during continuous cardiorespiratory monitoring by the radiology RN with a total moderate sedation time of 25 minutes.  Under real time ultrasound guidance, a 15 gauge trocar needle was advanced to the margin of the lower pole left kidney. The 16 gauge core needle was coaxially advanced for 3 passes. The core samples were submitted to pathology. The patient tolerated procedure well.  COMPLICATIONS: COMPLICATIONS none  IMPRESSION: 1. Technically successful ultrasound-guided core renal biopsy , lower pole left kidney.   Electronically Signed   By: Oley Balm M.D.   On: 04/19/2014 12:12   Ct Biopsy  04/21/2014   CLINICAL DATA:  Multiple myeloma  EXAM: CT GUIDED RIGHT ILIAC BONE MARROW ASPIRATION AND CORE BIOPSY  Date:  12/11/201512/03/2014 10:04 am  Radiologist:  M. Ruel Favors, MD   Guidance:  CT  FLUOROSCOPY TIME:  NONE.  MEDICATIONS AND MEDICAL HISTORY: 1 mg Versed, 50 mcg fentanyl  ANESTHESIA/SEDATION: 15 min  CONTRAST:  None.  COMPLICATIONS: None immediate  PROCEDURE: Informed consent was obtained from the patient following explanation of the procedure, risks, benefits and alternatives. The patient understands, agrees and consents for the procedure. All questions were addressed. A time out was performed.  The patient was positioned prone and noncontrast localization CT was performed of the pelvis to demonstrate the iliac marrow spaces.  Maximal barrier sterile technique utilized including caps, mask, sterile gowns, sterile gloves, large sterile drape, hand hygiene, and betadine prep.  Under sterile conditions and local anesthesia, an 11 gauge coaxial bone biopsy needle was advanced into the right iliac marrow space. Needle position was confirmed with CT imaging. Initially, bone marrow aspiration was  performed. Next, the 11 gauge outer cannula was utilized to obtain a right iliac bone marrow core biopsy. Needle was removed. Hemostasis was obtained with compression. The patient tolerated the procedure well. Samples were prepared with the cytotechnologist. No immediate complications.  IMPRESSION: CT guided right iliac bone marrow aspiration and core biopsy.   Electronically Signed   By: Daryll Brod M.D.   On: 04/21/2014 10:13   Dg Bone Survey Met  04/20/2014   CLINICAL DATA:  80 year old male with multiple myeloma.  EXAM: METASTATIC BONE SURVEY  COMPARISON:  Recent prior osseous survey 04/13/2014  FINDINGS: Chest: No discrete lytic lesion in the visualized bones. Right IJ approach tunneled hemodialysis/pheresis catheter. The catheter tips project over the mid SVC. Mild cardiomegaly. Moderately large hiatal hernia. Atherosclerotic calcification noted in the transverse aorta. Stable appearance of left lingular opacity, background bronchitic changes and interstitial prominence. No new acute  cardiopulmonary finding.  Spine: Limited visualization of the cervical spine beyond the level of C6 secondary to overlap of the shoulder girdles. Multilevel cervical spondylosis. Right worse than left facet arthropathy. No discrete lytic osseous lesion. Atherosclerotic calcification present in the region of the left carotid bifurcation. Multilevel degenerative spurring throughout the thoracic spine. No evidence of thoracic compression fracture. Degenerative spurring continues to the lumbar spine. There is moderate facet arthropathy at L4-L5 and L5-S1. No discrete lytic lesion.  Skull: No discrete osseous lesion. Normal aeration of the paranasal sinuses.  Upper extremities: No discrete lytic osseous lesion. No acute fracture or malalignment. Nonunion of remote healed fracture involving the right ulnar styloid.  Pelvis: No discrete lytic osseous lesion. The bones are mildly osteopenic. Brachy therapy seeds project over the prostate gland. Mild bilateral hip joint osteoarthritis.  Lower extremities: No discrete lytic lesion. Mild scattered atherosclerotic vascular calcifications.  IMPRESSION: No discrete lytic lesion to suggest plasmacytoma or focal multiple myeloma.  Additional ancillary findings as above.   Electronically Signed   By: Jacqulynn Cadet M.D.   On: 04/20/2014 10:50    Oren Binet, MD  [facility] Pager:[phone]  If 7PM-7AM, please contact night-coverage [email or web address] Password [email or web address] 04/29/2014, 10:48 AM   LOS: 11 days

## 2014-04-29 NOTE — Progress Notes (Signed)
Subjective: Interval History: has no complaint, desires to walk. Wants to go to [employer] \  Objective: Vital signs in last 24 hours: Temp:  [97.8 F (36.6 C)-98.7 F (37.1 C)] 97.8 F (36.6 C) (12/19 0612) Pulse Rate:  [59-65] 59 (12/19 0612) Resp:  [12-16] 16 (12/19 0612) BP: (134-149)/(55-63) 149/57 mmHg (12/19 0612) SpO2:  [95 %-98 %] 95 % (12/19 0612) Weight:  [82.9 kg (182 lb 12.2 oz)-82.901 kg (182 lb 12.2 oz)] 82.901 kg (182 lb 12.2 oz) (12/18 2107) Weight change: -0.4 kg (-14.1 oz)  Intake/Output from previous day: 12/18 0701 - 12/19 0700 In: 360 [P.O.:360] Out: 2200 [Urine:200] Intake/Output this shift:    General appearance: alert, cooperative, moderately obese and pale Resp: diminished breath sounds bilaterally and rales bibasilar Chest wall: RIJ cath Cardio: S1, S2 normal and systolic murmur: systolic ejection 2/6, decrescendo at 2nd left intercostal space GI: pos bs, liver down 6 cm, soft Extremities: edema 1+  Lab Results:  Recent Labs  04/28/14 0500  WBC 24.7*  HGB 9.0*  HCT 27.4*  PLT 282   BMET:  Recent Labs  04/28/14 0500 04/29/14 0445  NA 134* 132*  K 5.4* 5.8*  CL 96 94*  CO2 25 23  GLUCOSE 103* 90  BUN 86* 73*  CREATININE 5.94* 5.38*  CALCIUM 8.1* 8.2*   No results for input(s): PTH in the last 72 hours. Iron Studies: No results for input(s): IRON, TIBC, TRANSFERRIN, FERRITIN in the last 72 hours.  Studies/Results: No results found.  I have reviewed the patient's current medications.  Assessment/Plan: 1  AKI no resolutin acid base ok K ^ , prob secondary to chemo. Will tx with Kayex. Some vol xs 2 Anemia stable 3 MM s/p 2nd chemo 4 HPTH meds 5 HTN controlled 6 debill mobilize P Kayex, mobilize, epo , chemo    LOS: 11 days   Miguel Compton L 04/29/2014,9:41 AM

## 2014-04-30 ENCOUNTER — Inpatient Hospital Stay (HOSPITAL_COMMUNITY): Payer: Medicare Other

## 2014-04-30 LAB — RENAL FUNCTION PANEL
ALBUMIN: 1.9 g/dL — AB (ref 3.5–5.2)
Anion gap: 21 — ABNORMAL HIGH (ref 5–15)
BUN: 96 mg/dL — AB (ref 6–23)
CALCIUM: 7.9 mg/dL — AB (ref 8.4–10.5)
CO2: 20 mEq/L (ref 19–32)
Chloride: 94 mEq/L — ABNORMAL LOW (ref 96–112)
Creatinine, Ser: 6.64 mg/dL — ABNORMAL HIGH (ref 0.50–1.35)
GFR calc Af Amer: 8 mL/min — ABNORMAL LOW (ref >90)
GFR calc non Af Amer: 6 mL/min — ABNORMAL LOW (ref >90)
GLUCOSE: 87 mg/dL (ref 70–99)
PHOSPHORUS: 7.3 mg/dL — AB (ref 2.3–4.6)
Potassium: 5.5 mEq/L — ABNORMAL HIGH (ref 3.7–5.3)
Sodium: 135 mEq/L — ABNORMAL LOW (ref 137–147)

## 2014-04-30 LAB — CBC WITH DIFFERENTIAL/PLATELET
BAND NEUTROPHILS: 0 % (ref 0–10)
BASOS ABS: 0 10*3/uL (ref 0.0–0.1)
BASOS PCT: 0 % (ref 0–1)
Blasts: 0 %
EOS ABS: 0 10*3/uL (ref 0.0–0.7)
EOS PCT: 0 % (ref 0–5)
HEMATOCRIT: 29.7 % — AB (ref 39.0–52.0)
HEMOGLOBIN: 10 g/dL — AB (ref 13.0–17.0)
Lymphocytes Relative: 2 % — ABNORMAL LOW (ref 12–46)
Lymphs Abs: 0.7 10*3/uL (ref 0.7–4.0)
MCH: 31 pg (ref 26.0–34.0)
MCHC: 33.7 g/dL (ref 30.0–36.0)
MCV: 92 fL (ref 78.0–100.0)
Metamyelocytes Relative: 0 %
Monocytes Absolute: 2 10*3/uL — ABNORMAL HIGH (ref 0.1–1.0)
Monocytes Relative: 6 % (ref 3–12)
Myelocytes: 0 %
NEUTROS ABS: 31.1 10*3/uL — AB (ref 1.7–7.7)
NEUTROS PCT: 92 % — AB (ref 43–77)
PROMYELOCYTES ABS: 0 %
Platelets: 216 10*3/uL (ref 150–400)
RBC: 3.23 MIL/uL — AB (ref 4.22–5.81)
RDW: 13.4 % (ref 11.5–15.5)
WBC: 33.8 10*3/uL — ABNORMAL HIGH (ref 4.0–10.5)
nRBC: 0 /100 WBC

## 2014-04-30 MED ORDER — SODIUM POLYSTYRENE SULFONATE 15 GM/60ML PO SUSP
50.0000 g | Freq: Once | ORAL | Status: AC
  Administered 2014-04-30: 50 g via ORAL
  Filled 2014-04-30: qty 240

## 2014-04-30 NOTE — Progress Notes (Signed)
PATIENT DETAILS Name: Miguel Compton Age: 80 y.o. Sex: male Date of Birth: 10/02/1921 Admit Date: 04/18/2014 Admitting Physician Costin Karlyne Greenspan, MD [id number], Jenny Reichmann, MD  Brief Narrative: 80 year old highly functional male with history of COPD, hypertension, dyslipidemia and mild early dementia along with CVA in the past was admitted from PCP office for worsening renal failure. Patient was recently diagnosed with multiple myeloma. Initially thought to have renal failure from multiple myeloma, however preliminary pathology from kidney biopsy negative for this. Also underwent bone marrow biopsy which is currently pending report. Continues to have significant renal dysfunction, being followed by both nephrology and oncology. Has been started on Decadron and Velcade by oncology. Nephrology following and patient has undergone hemodialysis on 04/22/14, current plans are to follow renal function with prn dialysis and close monitoring  Subjective: No major issues overnight.Denies diarrhea  Assessment/Plan: Principal Problem: WYO:VZCHYIFOY suspicion for myeloma kidney, however biopsy negative myeloma cast nephropathy.Etiology of renal failure unclear, underwent  hemodialysis X 2. Renal planning additional HD on 12/21. Nephrology hopeful for eventual return of renal function, however may require further HD depending on renal function/output. Continue to monitor and closely follow.  Active Problems: Mild Hyperkalemia: Will treat with Kayexalate again today,will undergo HD in am   Multiple myeloma:Hem/Onc consulted, underwent bone marrow biopsy on 04/21/14, pathology confirms myeloma.Has been started on Decadron and Velcade.Oncology following-Dr Lindi Adie planning chemoTx  every Friday (recieved 2 cycles so far)  History of CVA:Non focal exam, ASA held for renal bx-has been resumed  Dyslipidemia: On statin -continue.  Mild dementia: On Aricept.Currently awake/alert X4  Essential hypertension:  BP moderatly controlled, continue Coreg, Amlodipine discontinued-as vol being removed with HD. Marland Kitchen   Constipation: continue  miralax,senokot.Prn Lactulose. Having almost daily BM's  Leukocytosis: Likely due to steroid use and possible multiple myeloma. No fever,no signs of infection. However significant increase in WBC today, therefore will check Blood cultures, CXR and UA today.Monitor off antibiotics for now, as non toxic appearing and afebrile  Disposition: Remain inpatient  Antibiotics:  See below  Anti-infectives    Start     Dose/Rate Route Frequency Ordered Stop   04/21/14 2200  acyclovir (ZOVIRAX) 200 MG capsule 400 mg     400 mg Oral 2 times daily 04/21/14 1658     04/21/14 0000  acyclovir (ZOVIRAX) 400 MG tablet     400 mg Oral 2 times daily 04/21/14 1659     04/19/14 0930  ceFAZolin (ANCEF) IVPB 2 g/50 mL premix    Comments:  Hang ON CALL to xray 12/9   2 g100 mL/hr over 30 Minutes Intravenous On call 04/19/14 0917 04/19/14 1133      DVT Prophylaxis: Prophylactic  Heparin  Code Status: Full code   Family Communication Son in law at bedside on 12/17  Procedures:  None  CONSULTS:  nephrology and hematology/oncology   MEDICATIONS: Scheduled Meds: . acyclovir  400 mg Oral BID  . aspirin  81 mg Oral Daily  . calcitRIOL  0.5 mcg Oral Q M,W,F-HD  . carvedilol  3.125 mg Oral BID WC  . darbepoetin (ARANESP) injection - DIALYSIS  150 mcg Intravenous Q Fri-HD  . dexamethasone  20 mg Oral Weekly  . feeding supplement (NEPRO CARB STEADY)  237 mL Oral Q1500  . fluticasone  1 puff Inhalation BID  . heparin subcutaneous  5,000 Units Subcutaneous 3 times per day  . lanthanum  1,000 mg Oral TID WC  . multivitamin  1 tablet  Oral QHS  . pantoprazole  40 mg Oral Daily  . polyethylene glycol  17 g Oral BID  . pravastatin  80 mg Oral q1800  . senna  2 tablet Oral BID  . sodium chloride  3 mL Intravenous Q12H   Continuous Infusions:  PRN Meds:.albuterol, ALPRAZolam,  bisacodyl, HYDROcodone-acetaminophen, lactulose, prochlorperazine    PHYSICAL EXAM: Vital signs in last 24 hours: Filed Vitals:   04/30/14 0500 04/30/14 0515 04/30/14 0734 04/30/14 0959  BP: 178/64 155/63 155/62 132/57  Pulse: 62   64  Temp: 98.6 F (37 C)   98.3 F (36.8 C)  TempSrc: Oral   Oral  Resp: 18   17  Height:      Weight:      SpO2: 95%   97%    Weight change: 0.1 kg (3.5 oz) Filed Weights   04/28/14 0945 04/28/14 2107 04/29/14 2038  Weight: 82.9 kg (182 lb 12.2 oz) 82.901 kg (182 lb 12.2 oz) 83 kg (182 lb 15.7 oz)   Body mass index is 26.26 kg/(m^2).   Gen Exam: Awake and alert with clear speech.   Neck: Supple, No JVD.   Chest: B/L Clear.  No rales or rhonchi CVS: S1 S2 Regular, no murmurs.  Abdomen: soft, BS +, non tender, non distended.  Extremities: no edema, lower extremities warm to touch. Neurologic: Non Focal.   Skin: No Rash.   Wounds: N/A.    Intake/Output from previous day:  Intake/Output Summary (Last 24 hours) at 04/30/14 1027 Last data filed at 04/30/14 0900  Gross per 24 hour  Intake    300 ml  Output    250 ml  Net     50 ml     LAB RESULTS: CBC  Recent Labs Lab 04/24/14 0442 04/26/14 0554 04/28/14 0500 04/30/14 0448  WBC 17.0* 19.2* 24.7* 33.8*  HGB 8.8* 9.8* 9.0* 10.0*  HCT 25.8* 29.0* 27.4* 29.7*  PLT 250 320 282 216  MCV 86.6 87.6 89.0 92.0  MCH 29.5 29.6 29.2 31.0  MCHC 34.1 33.8 32.8 33.7  RDW 13.1 13.0 13.1 13.4  LYMPHSABS  --   --   --  0.7  MONOABS  --   --   --  2.0*  EOSABS  --   --   --  0.0  BASOSABS  --   --   --  0.0    Chemistries   Recent Labs Lab 04/27/14 0430 04/28/14 0500 04/29/14 0445 04/29/14 1640 04/30/14 0448  NA 131* 134* 132* 135* 135*  K 5.1 5.4* 5.8* 5.0 5.5*  CL 94* 96 94* 94* 94*  CO2 $Re'22 25 23 21 20  'FLH$ GLUCOSE 128* 103* 90 163* 87  BUN 72* 86* 73* 86* 96*  CREATININE 5.07* 5.94* 5.38* 6.08* 6.64*  CALCIUM 7.8* 8.1* 8.2* 8.2* 7.9*    CBG: No results for input(s): GLUCAP in  the last 168 hours.  GFR Estimated Creatinine Clearance: 7.6 mL/min (by C-G formula based on Cr of 6.64).  Coagulation profile No results for input(s): INR, PROTIME in the last 168 hours.  Cardiac Enzymes No results for input(s): CKMB, TROPONINI, MYOGLOBIN in the last 168 hours.  Invalid input(s): CK  Invalid input(s): POCBNP No results for input(s): DDIMER in the last 72 hours. No results for input(s): HGBA1C in the last 72 hours. No results for input(s): CHOL, HDL, LDLCALC, TRIG, CHOLHDL, LDLDIRECT in the last 72 hours. No results for input(s): TSH, T4TOTAL, T3FREE, THYROIDAB in the last 72 hours.  Invalid input(s):  FREET3 No results for input(s): VITAMINB12, FOLATE, FERRITIN, TIBC, IRON, RETICCTPCT in the last 72 hours. No results for input(s): LIPASE, AMYLASE in the last 72 hours.  Urine Studies No results for input(s): UHGB, CRYS in the last 72 hours.  Invalid input(s): UACOL, UAPR, USPG, UPH, UTP, UGL, UKET, UBIL, UNIT, UROB, ULEU, UEPI, UWBC, URBC, UBAC, CAST, UCOM, BILUA  MICROBIOLOGY: No results found for this or any previous visit (from the past 240 hour(s)).  RADIOLOGY STUDIES/RESULTS: Dg Chest 2 View  04/18/2014   CLINICAL DATA:  ABNORMAL LAB, shortness of breath, COPD  EXAM: CHEST - 2 VIEW  COMPARISON:  08/25/2011  FINDINGS: Coarse bilateral interstitial opacities left greater than right, with relative sparing of the apices, increased since previous exam. Heart size is normal. No effusion. Visualized skeletal structures are unremarkable.  IMPRESSION: 1. Progressive coarse bilateral asymmetric interstitial opacities, probably largely chronic.   Electronically Signed   By: Arne Cleveland M.D.   On: 04/18/2014 15:36   Ir Fluoro Guide Cv Line Right  04/19/2014   CLINICAL DATA:  Acute renal insufficiency, needs access for hemodialysis  EXAM: TUNNELED HEMODIALYSIS CATHETER PLACEMENT WITH ULTRASOUND AND FLUOROSCOPIC GUIDANCE  TECHNIQUE: The procedure, risks, benefits, and  alternatives were explained to the patient. Questions regarding the procedure were encouraged and answered. The patient understands and consents to the procedure. As antibiotic prophylaxis, cefazolin 2 g was ordered pre-procedure and administered intravenously within one hour of incision.Patency of the right IJ vein was confirmed with ultrasound with image documentation. An appropriate skin site was determined. Region was prepped using maximum barrier technique including cap and mask, sterile gown, sterile gloves, large sterile sheet, and Chlorhexidine as cutaneous antisepsis. The region was infiltrated locally with 1% lidocaine.  Intravenous Fentanyl and Versed were administered as conscious sedation during continuous cardiorespiratory monitoring by the radiology RN, with a total moderate sedation time of 25 minutes.  Under real-time ultrasound guidance, the right IJ vein was accessed with a 21 gauge micropuncture needle; the needle tip within the vein was confirmed with ultrasound image documentation. Needle exchanged over the 018 guidewire for transitional dilator, which allowed advancement of a Benson wire into the IVC. Over this, an MPA catheter was advanced. A Hemosplit 19 hemodialysis catheter was tunneled from the right anterior chest wall approach to the right IJ dermatotomy site. The MPA catheter was exchanged over an Amplatz wire for serial vascular dilators which allow placement of a peel-away sheath, through which the catheter was advanced under intermittent fluoroscopy, positioned with its tips in the proximal and midright atrium. Spot chest radiograph confirms good catheter position. No pneumothorax. Catheter was flushed and primed per protocol. Catheter secured externally with O Prolene sutures. The right IJ dermatotomy site was closed with Dermabond.  COMPLICATIONS: COMPLICATIONS None immediate  FLUOROSCOPY TIME:  48 seconds  COMPARISON:  None  IMPRESSION: 1. Technically successful placement of  tunneled right IJ hemodialysis catheter with ultrasound and fluoroscopic guidance. Ready for routine use.  ACCESS: Remains approachable for percutaneous intervention as needed.   Electronically Signed   By: Arne Cleveland M.D.   On: 04/19/2014 12:10   Ir US Guide Vasc Access Right  04/19/2014   CLINICAL DATA:  Acute renal insufficiency, needs access for hemodialysis  EXAM: TUNNELED HEMODIALYSIS CATHETER PLACEMENT WITH ULTRASOUND AND FLUOROSCOPIC GUIDANCE  TECHNIQUE: The procedure, risks, benefits, and alternatives were explained to the patient. Questions regarding the procedure were encouraged and answered. The patient understands and consents to the procedure. As antibiotic prophylaxis, cefazolin 2 g was  ordered pre-procedure and administered intravenously within one hour of incision.Patency of the right IJ vein was confirmed with ultrasound with image documentation. An appropriate skin site was determined. Region was prepped using maximum barrier technique including cap and mask, sterile gown, sterile gloves, large sterile sheet, and Chlorhexidine as cutaneous antisepsis. The region was infiltrated locally with 1% lidocaine.  Intravenous Fentanyl and Versed were administered as conscious sedation during continuous cardiorespiratory monitoring by the radiology RN, with a total moderate sedation time of 25 minutes.  Under real-time ultrasound guidance, the right IJ vein was accessed with a 21 gauge micropuncture needle; the needle tip within the vein was confirmed with ultrasound image documentation. Needle exchanged over the 018 guidewire for transitional dilator, which allowed advancement of a Benson wire into the IVC. Over this, an MPA catheter was advanced. A Hemosplit 19 hemodialysis catheter was tunneled from the right anterior chest wall approach to the right IJ dermatotomy site. The MPA catheter was exchanged over an Amplatz wire for serial vascular dilators which allow placement of a peel-away sheath,  through which the catheter was advanced under intermittent fluoroscopy, positioned with its tips in the proximal and midright atrium. Spot chest radiograph confirms good catheter position. No pneumothorax. Catheter was flushed and primed per protocol. Catheter secured externally with O Prolene sutures. The right IJ dermatotomy site was closed with Dermabond.  COMPLICATIONS: COMPLICATIONS None immediate  FLUOROSCOPY TIME:  48 seconds  COMPARISON:  None  IMPRESSION: 1. Technically successful placement of tunneled right IJ hemodialysis catheter with ultrasound and fluoroscopic guidance. Ready for routine use.  ACCESS: Remains approachable for percutaneous intervention as needed.   Electronically Signed   By: Arne Cleveland M.D.   On: 04/19/2014 12:10   Ir US Guide Bx Asp/drain  04/19/2014   CLINICAL DATA:  Acute renal insufficiency  COMPARISON:  CT 03/21/2008  TECHNIQUE: ULTRASOUND GUIDED RENAL CORE BIOPSY  Survey ultrasound was performed and an appropriate skin entry site was localized. Site was marked, prepped with Betadine, draped in usual sterile fashion, infiltrated locally with 1% lidocaine. Intravenous Fentanyl and Versed were administered as conscious sedation during continuous cardiorespiratory monitoring by the radiology RN with a total moderate sedation time of 25 minutes.  Under real time ultrasound guidance, a 15 gauge trocar needle was advanced to the margin of the lower pole left kidney. The 16 gauge core needle was coaxially advanced for 3 passes. The core samples were submitted to pathology. The patient tolerated procedure well.  COMPLICATIONS: COMPLICATIONS none  IMPRESSION: 1. Technically successful ultrasound-guided core renal biopsy , lower pole left kidney.   Electronically Signed   By: Arne Cleveland M.D.   On: 04/19/2014 12:12   Ct Biopsy  04/21/2014   CLINICAL DATA:  Multiple myeloma  EXAM: CT GUIDED RIGHT ILIAC BONE MARROW ASPIRATION AND CORE BIOPSY  Date:  12/11/201512/03/2014 10:04  am  Radiologist:  M. Daryll Brod, MD  Guidance:  CT  FLUOROSCOPY TIME:  NONE.  MEDICATIONS AND MEDICAL HISTORY: 1 mg Versed, 50 mcg fentanyl  ANESTHESIA/SEDATION: 15 min  CONTRAST:  None.  COMPLICATIONS: None immediate  PROCEDURE: Informed consent was obtained from the patient following explanation of the procedure, risks, benefits and alternatives. The patient understands, agrees and consents for the procedure. All questions were addressed. A time out was performed.  The patient was positioned prone and noncontrast localization CT was performed of the pelvis to demonstrate the iliac marrow spaces.  Maximal barrier sterile technique utilized including caps, mask, sterile gowns, sterile gloves, large sterile  drape, hand hygiene, and betadine prep.  Under sterile conditions and local anesthesia, an 11 gauge coaxial bone biopsy needle was advanced into the right iliac marrow space. Needle position was confirmed with CT imaging. Initially, bone marrow aspiration was performed. Next, the 11 gauge outer cannula was utilized to obtain a right iliac bone marrow core biopsy. Needle was removed. Hemostasis was obtained with compression. The patient tolerated the procedure well. Samples were prepared with the cytotechnologist. No immediate complications.  IMPRESSION: CT guided right iliac bone marrow aspiration and core biopsy.   Electronically Signed   By: Daryll Brod M.D.   On: 04/21/2014 10:13   Dg Chest Port 1 View  04/30/2014   CLINICAL DATA:  Shortness of breath and leukocytosis  EXAM: PORTABLE CHEST - 1 VIEW  COMPARISON:  April 18, 2014  FINDINGS: There is underlying emphysematous change. There is no edema or consolidation. There is interstitial prominence in the mid and lower lung zones, probably representing underlying fibrotic type change, stable. There is no frank edema or consolidation. Heart is upper normal in size. Pulmonary vascularity is within normal limits. Aorta appears somewhat tortuous but stable.  Central catheter tip is in the superior vena cava. No pneumothorax. No adenopathy. No bone lesions. There is degenerative change in the thoracic spine.  IMPRESSION: Underlying emphysema. Changes of interstitial fibrosis, stable. No frank edema or consolidation.   Electronically Signed   By: Lowella Grip M.D.   On: 04/30/2014 08:03   Dg Bone Survey Met  04/20/2014   CLINICAL DATA:  80 year old male with multiple myeloma.  EXAM: METASTATIC BONE SURVEY  COMPARISON:  Recent prior osseous survey 04/13/2014  FINDINGS: Chest: No discrete lytic lesion in the visualized bones. Right IJ approach tunneled hemodialysis/pheresis catheter. The catheter tips project over the mid SVC. Mild cardiomegaly. Moderately large hiatal hernia. Atherosclerotic calcification noted in the transverse aorta. Stable appearance of left lingular opacity, background bronchitic changes and interstitial prominence. No new acute cardiopulmonary finding.  Spine: Limited visualization of the cervical spine beyond the level of C6 secondary to overlap of the shoulder girdles. Multilevel cervical spondylosis. Right worse than left facet arthropathy. No discrete lytic osseous lesion. Atherosclerotic calcification present in the region of the left carotid bifurcation. Multilevel degenerative spurring throughout the thoracic spine. No evidence of thoracic compression fracture. Degenerative spurring continues to the lumbar spine. There is moderate facet arthropathy at L4-L5 and L5-S1. No discrete lytic lesion.  Skull: No discrete osseous lesion. Normal aeration of the paranasal sinuses.  Upper extremities: No discrete lytic osseous lesion. No acute fracture or malalignment. Nonunion of remote healed fracture involving the right ulnar styloid.  Pelvis: No discrete lytic osseous lesion. The bones are mildly osteopenic. Brachy therapy seeds project over the prostate gland. Mild bilateral hip joint osteoarthritis.  Lower extremities: No discrete lytic  lesion. Mild scattered atherosclerotic vascular calcifications.  IMPRESSION: No discrete lytic lesion to suggest plasmacytoma or focal multiple myeloma.  Additional ancillary findings as above.   Electronically Signed   By: Jacqulynn Cadet M.D.   On: 04/20/2014 10:50    Oren Binet, MD  [facility] Pager:[phone]  If 7PM-7AM, please contact night-coverage [email or web address] Password [email or web address] 04/30/2014, 10:27 AM   LOS: 12 days

## 2014-04-30 NOTE — Progress Notes (Signed)
Subjective: Interval History: has complaints hard to lay still.  Objective: Vital signs in last 24 hours: Temp:  [97.6 F (36.4 C)-98.6 F (37 C)] 98.6 F (37 C) (12/20 0500) Pulse Rate:  [61-68] 62 (12/20 0500) Resp:  [17-18] 18 (12/20 0500) BP: (153-178)/(62-73) 155/62 mmHg (12/20 0734) SpO2:  [95 %-97 %] 95 % (12/20 0500) Weight:  [83 kg (182 lb 15.7 oz)] 83 kg (182 lb 15.7 oz) (12/19 2038) Weight change: 0.1 kg (3.5 oz)  Intake/Output from previous day: 12/19 0701 - 12/20 0700 In: 180 [P.O.:180] Out: 250 [Urine:250] Intake/Output this shift:    General appearance: alert, cooperative, no distress, mildly obese and pale Resp: diminished breath sounds bibasilar and rales bibasilar Chest wall: RIJ cath Cardio: S1, S2 normal and systolic murmur: systolic ejection 3/6, decrescendo at 2nd left intercostal space GI: pos bs, liver down 5 cm Extremities: edema 1-2+  Lab Results:  Recent Labs  04/28/14 0500 04/30/14 0448  WBC 24.7* 33.8*  HGB 9.0* 10.0*  HCT 27.4* 29.7*  PLT 282 216   BMET:  Recent Labs  04/29/14 1640 04/30/14 0448  NA 135* 135*  K 5.0 5.5*  CL 94* 94*  CO2 21 20  GLUCOSE 163* 87  BUN 86* 96*  CREATININE 6.08* 6.64*  CALCIUM 8.2* 7.9*   No results for input(s): PTH in the last 72 hours. Iron Studies: No results for input(s): IRON, TIBC, TRANSFERRIN, FERRITIN in the last 72 hours.  Studies/Results: Dg Chest Port 1 View  04/30/2014   CLINICAL DATA:  Shortness of breath and leukocytosis  EXAM: PORTABLE CHEST - 1 VIEW  COMPARISON:  April 18, 2014  FINDINGS: There is underlying emphysematous change. There is no edema or consolidation. There is interstitial prominence in the mid and lower lung zones, probably representing underlying fibrotic type change, stable. There is no frank edema or consolidation. Heart is upper normal in size. Pulmonary vascularity is within normal limits. Aorta appears somewhat tortuous but stable. Central catheter tip is in  the superior vena cava. No pneumothorax. No adenopathy. No bone lesions. There is degenerative change in the thoracic spine.  IMPRESSION: Underlying emphysema. Changes of interstitial fibrosis, stable. No frank edema or consolidation.   Electronically Signed   By: Lowella Grip M.D.   On: 04/30/2014 08:03    I have reviewed the patient's current medications.  Assessment/Plan: 1 AKI MM cause.  Nonoliguric but no function as indic by rate of rise of S Cr.  Will sched HD in am and eval pre HD chem.  Has vol xs 2 anemia on epo 3 MM per Onc had chemo on Fri 4 GERD 5 HPTH vit D P Hd, check chem, lower vol , epo    LOS: 12 days   Miguel Compton 04/30/2014,10:01 AM

## 2014-05-01 DIAGNOSIS — R5383 Other fatigue: Secondary | ICD-10-CM

## 2014-05-01 LAB — RENAL FUNCTION PANEL
ALBUMIN: 1.7 g/dL — AB (ref 3.5–5.2)
ANION GAP: 19 — AB (ref 5–15)
BUN: 108 mg/dL — ABNORMAL HIGH (ref 6–23)
CHLORIDE: 91 meq/L — AB (ref 96–112)
CO2: 23 meq/L (ref 19–32)
Calcium: 7.6 mg/dL — ABNORMAL LOW (ref 8.4–10.5)
Creatinine, Ser: 8.44 mg/dL — ABNORMAL HIGH (ref 0.50–1.35)
GFR, EST AFRICAN AMERICAN: 6 mL/min — AB (ref >90)
GFR, EST NON AFRICAN AMERICAN: 5 mL/min — AB (ref >90)
Glucose, Bld: 75 mg/dL (ref 70–99)
POTASSIUM: 5.1 meq/L (ref 3.7–5.3)
Phosphorus: 6.8 mg/dL — ABNORMAL HIGH (ref 2.3–4.6)
SODIUM: 133 meq/L — AB (ref 137–147)

## 2014-05-01 LAB — CBC WITH DIFFERENTIAL/PLATELET
Basophils Absolute: 0 10*3/uL (ref 0.0–0.1)
Basophils Relative: 0 % (ref 0–1)
Eosinophils Absolute: 0.1 10*3/uL (ref 0.0–0.7)
Eosinophils Relative: 0 % (ref 0–5)
HCT: 26.8 % — ABNORMAL LOW (ref 39.0–52.0)
Hemoglobin: 8.9 g/dL — ABNORMAL LOW (ref 13.0–17.0)
LYMPHS ABS: 2.4 10*3/uL (ref 0.7–4.0)
LYMPHS PCT: 10 % — AB (ref 12–46)
MCH: 29.8 pg (ref 26.0–34.0)
MCHC: 33.2 g/dL (ref 30.0–36.0)
MCV: 89.6 fL (ref 78.0–100.0)
Monocytes Absolute: 1.6 10*3/uL — ABNORMAL HIGH (ref 0.1–1.0)
Monocytes Relative: 7 % (ref 3–12)
NEUTROS ABS: 20.8 10*3/uL — AB (ref 1.7–7.7)
NEUTROS PCT: 83 % — AB (ref 43–77)
PLATELETS: 186 10*3/uL (ref 150–400)
RBC: 2.99 MIL/uL — AB (ref 4.22–5.81)
RDW: 13.2 % (ref 11.5–15.5)
WBC: 24.9 10*3/uL — AB (ref 4.0–10.5)

## 2014-05-01 LAB — CHROMOSOME ANALYSIS, BONE MARROW

## 2014-05-01 LAB — KAPPA/LAMBDA LIGHT CHAINS
KAPPA FREE LGHT CHN: 0.26 mg/dL — AB (ref 0.33–1.94)
Kappa, lambda light chain ratio: 0 — ABNORMAL LOW (ref 0.26–1.65)
Lambda free light chains: 132 mg/dL — ABNORMAL HIGH (ref 0.57–2.63)

## 2014-05-01 NOTE — Progress Notes (Signed)
PATIENT DETAILS Name: Miguel Compton Age: 80 y.o. Sex: male Date of Birth: [birth year] Admit Date: 04/18/2014 Admitting Physician Costin Karlyne Greenspan, MD EXH:[id number], Jenny Reichmann, MD  Brief Narrative: 80 year old highly functional male with history of COPD, hypertension, dyslipidemia and mild early dementia along with CVA in the past was admitted from PCP office for worsening renal failure. Patient was recently diagnosed with multiple myeloma. Initially thought to have renal failure from multiple myeloma, however preliminary pathology from kidney biopsy negative for this. Also underwent bone marrow biopsy which is currently pending report. Continues to have significant renal dysfunction, being followed by both nephrology and oncology. Has been started on Decadron and Velcade by oncology. Nephrology following and patient has undergone hemodialysis on 04/22/14, current plans are to follow renal function with prn dialysis and close monitoring  Subjective: No major issues overnight.  Assessment/Plan: Principal Problem: RCV:ELFYBOFBP suspicion for myeloma kidney, however biopsy negative myeloma cast nephropathy.Etiology of renal failure unclear, underwent  hemodialysis X 2. Renal planning additional HD on 12/21. 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 Continue to monitor and closely follow.  Active Problems: Leukocytosis: Likely due to steroid use and possible multiple myeloma. No fever,no signs of infection. Blood culture on 12/20 neg so far. Remain non toxic appearing, without any fever. Continue to monitor off Abx.  Mild Hyperkalemia: persists inspite of Kayexalate X 2,s/p  HD today-repeat lytes in am   Multiple myeloma:Hem/Onc consulted, underwent bone marrow biopsy on 04/21/14, pathology confirms myeloma.Has been started on Decadron and Velcade.Oncology following-Dr Lindi Adie  planning chemoTx  every Friday (recieved 2 cycles so far)  History of CVA:Non focal exam, ASA held for renal bx-has been resumed  Dyslipidemia: On statin -continue.  Mild dementia: On Aricept.Currently awake/alert X4  Essential hypertension: BP moderatly controlled, continue Coreg, Amlodipine discontinued-as vol being removed with HD. Marland Kitchen   Constipation: continue  miralax,senokot.Prn Lactulose. Having almost daily BM's  Disposition: Remain inpatient  Antibiotics:  See below  Anti-infectives    Start     Dose/Rate Route Frequency Ordered Stop   04/21/14 2200  acyclovir (ZOVIRAX) 200 MG capsule 400 mg     400 mg Oral 2 times daily 04/21/14 1658     04/21/14 0000  acyclovir (ZOVIRAX) 400 MG tablet     400 mg Oral 2 times daily 04/21/14 1659     04/19/14 0930  ceFAZolin (ANCEF) IVPB 2 g/50 mL premix    Comments:  Hang ON CALL to xray 12/9   2 g100 mL/hr over 30 Minutes Intravenous On call 04/19/14 0917 04/19/14 1133      DVT Prophylaxis: Prophylactic  Heparin  Code Status: Full code   Family Communication Son in law at bedside on 12/17  Procedures:  None  CONSULTS:  nephrology and hematology/oncology   MEDICATIONS: Scheduled Meds: . acyclovir  400 mg Oral BID  . aspirin  81 mg Oral Daily  . calcitRIOL  0.5 mcg Oral Q M,W,F-HD  . carvedilol  3.125 mg Oral BID WC  . darbepoetin (ARANESP) injection - DIALYSIS  150 mcg Intravenous Q Fri-HD  . dexamethasone  20 mg Oral Weekly  . feeding supplement (NEPRO CARB STEADY)  237 mL Oral Q1500  . fluticasone  1 puff Inhalation BID  . heparin subcutaneous  5,000 Units Subcutaneous 3 times per day  . lanthanum  1,000 mg Oral TID WC  . multivitamin  1 tablet Oral QHS  . pantoprazole  40 mg Oral Daily  . polyethylene glycol  17 g Oral BID  . pravastatin  80 mg Oral q1800  . senna  2 tablet Oral BID  . sodium chloride  3 mL Intravenous Q12H   Continuous Infusions:  PRN Meds:.albuterol, ALPRAZolam, bisacodyl,  HYDROcodone-acetaminophen, lactulose, prochlorperazine    PHYSICAL EXAM: Vital signs in last 24 hours: Filed Vitals:   05/01/14 0930 05/01/14 1000 05/01/14 1030 05/01/14 1047  BP: 125/54 125/58 138/59 149/64  Pulse: 67 65 71 70  Temp:    97.4 F (36.3 C)  TempSrc:    Oral  Resp: $Remo'18 15 18 18  'MFJGl$ Height:      Weight:      SpO2:    96%    Weight change: 1.1 kg (2 lb 6.8 oz) Filed Weights   04/29/14 2038 04/30/14 2100 05/01/14 0636  Weight: 83 kg (182 lb 15.7 oz) 84.1 kg (185 lb 6.5 oz) 85.1 kg (187 lb 9.8 oz)   Body mass index is 26.92 kg/(m^2).   Gen Exam: Awake and alert with clear speech.   Neck: Supple, No JVD.   Chest: B/L Clear.  No rales or rhonchi CVS: S1 S2 Regular, no murmurs.  Abdomen: soft, BS +, non tender, non distended.  Extremities: no edema, lower extremities warm to touch. Neurologic: Non Focal.   Skin: No Rash.   Wounds: N/A.    Intake/Output from previous day:  Intake/Output Summary (Last 24 hours) at 05/01/14 1242 Last data filed at 05/01/14 1140  Gross per 24 hour  Intake    420 ml  Output     30 ml  Net    390 ml     LAB RESULTS: CBC  Recent Labs Lab 04/26/14 0554 04/28/14 0500 04/30/14 0448 05/01/14 0500  WBC 19.2* 24.7* 33.8* 24.9*  HGB 9.8* 9.0* 10.0* 8.9*  HCT 29.0* 27.4* 29.7* 26.8*  PLT 320 282 216 186  MCV 87.6 89.0 92.0 89.6  MCH 29.6 29.2 31.0 29.8  MCHC 33.8 32.8 33.7 33.2  RDW 13.0 13.1 13.4 13.2  LYMPHSABS  --   --  0.7 2.4  MONOABS  --   --  2.0* 1.6*  EOSABS  --   --  0.0 0.1  BASOSABS  --   --  0.0 0.0    Chemistries   Recent Labs Lab 04/28/14 0500 04/29/14 0445 04/29/14 1640 04/30/14 0448 05/01/14 0500  NA 134* 132* 135* 135* 133*  K 5.4* 5.8* 5.0 5.5* 5.1  CL 96 94* 94* 94* 91*  CO2 $Re'25 23 21 20 23  'LXt$ GLUCOSE 103* 90 163* 87 75  BUN 86* 73* 86* 96* 108*  CREATININE 5.94* 5.38* 6.08* 6.64* 8.44*  CALCIUM 8.1* 8.2* 8.2* 7.9* 7.6*    CBG: No results for input(s): GLUCAP in the last 168  hours.  GFR Estimated Creatinine Clearance: 6 mL/min (by C-G formula based on Cr of 8.44).  Coagulation profile No results for input(s): INR, PROTIME in the last 168 hours.  Cardiac Enzymes No results for input(s): CKMB, TROPONINI, MYOGLOBIN in the last 168 hours.  Invalid input(s): CK  Invalid input(s): POCBNP No results for input(s): DDIMER in the last 72 hours. No results for input(s): HGBA1C in the last 72 hours. No results for input(s): CHOL, HDL, LDLCALC, TRIG, CHOLHDL, LDLDIRECT in the last 72 hours. No results for input(s): TSH, T4TOTAL, T3FREE, THYROIDAB in the last 72 hours.  Invalid input(s): FREET3 No results for input(s): VITAMINB12, FOLATE,  FERRITIN, TIBC, IRON, RETICCTPCT in the last 72 hours. No results for input(s): LIPASE, AMYLASE in the last 72 hours.  Urine Studies No results for input(s): UHGB, CRYS in the last 72 hours.  Invalid input(s): UACOL, UAPR, USPG, UPH, UTP, UGL, UKET, UBIL, UNIT, UROB, ULEU, UEPI, UWBC, URBC, UBAC, CAST, UCOM, BILUA  MICROBIOLOGY: Recent Results (from the past 240 hour(s))  Culture, blood (routine x 2)     Status: None (Preliminary result)   Collection Time: 04/30/14  8:30 AM  Result Value Ref Range Status   Specimen Description RIGHT ANTECUBITAL  Final   Special Requests BOTTLES DRAWN AEROBIC AND ANAEROBIC 10CC  Final   Culture  Setup Time   Final    04/30/2014 21:04 Performed at [facility]    Culture   Final           BLOOD CULTURE RECEIVED NO GROWTH TO DATE CULTURE WILL BE HELD FOR 5 DAYS BEFORE ISSUING A FINAL NEGATIVE REPORT Performed at [facility]    Report Status PENDING  Incomplete  Culture, blood (routine x 2)     Status: None (Preliminary result)   Collection Time: 04/30/14  8:38 AM  Result Value Ref Range Status   Specimen Description RIGHT ANTECUBITAL  Final   Special Requests BOTTLES DRAWN AEROBIC AND ANAEROBIC 10CC  Final   Culture  Setup Time   Final    04/30/2014 21:04 Performed at  [facility]    Culture   Final           BLOOD CULTURE RECEIVED NO GROWTH TO DATE CULTURE WILL BE HELD FOR 5 DAYS BEFORE ISSUING A FINAL NEGATIVE REPORT Performed at [facility]    Report Status PENDING  Incomplete    RADIOLOGY STUDIES/RESULTS: Dg Chest 2 View  04/18/2014   CLINICAL DATA:  ABNORMAL LAB, shortness of breath, COPD  EXAM: CHEST - 2 VIEW  COMPARISON:  08/25/2011  FINDINGS: Coarse bilateral interstitial opacities left greater than right, with relative sparing of the apices, increased since previous exam. Heart size is normal. No effusion. Visualized skeletal structures are unremarkable.  IMPRESSION: 1. Progressive coarse bilateral asymmetric interstitial opacities, probably largely chronic.   Electronically Signed   By: Arne Cleveland M.D.   On: 04/18/2014 15:36   Ir Fluoro Guide Cv Line Right  04/19/2014   CLINICAL DATA:  Acute renal insufficiency, needs access for hemodialysis  EXAM: TUNNELED HEMODIALYSIS CATHETER PLACEMENT WITH ULTRASOUND AND FLUOROSCOPIC GUIDANCE  TECHNIQUE: The procedure, risks, benefits, and alternatives were explained to the patient. Questions regarding the procedure were encouraged and answered. The patient understands and consents to the procedure. As antibiotic prophylaxis, cefazolin 2 g was ordered pre-procedure and administered intravenously within one hour of incision.Patency of the right IJ vein was confirmed with ultrasound with image documentation. An appropriate skin site was determined. Region was prepped using maximum barrier technique including cap and mask, sterile gown, sterile gloves, large sterile sheet, and Chlorhexidine as cutaneous antisepsis. The region was infiltrated locally with 1% lidocaine.  Intravenous Fentanyl and Versed were administered as conscious sedation during continuous cardiorespiratory monitoring by the radiology RN, with a total moderate sedation time of 25 minutes.  Under real-time ultrasound guidance, the  right IJ vein was accessed with a 21 gauge micropuncture needle; the needle tip within the vein was confirmed with ultrasound image documentation. Needle exchanged over the 018 guidewire for transitional dilator, which allowed advancement of a Benson wire into the IVC. Over this, an MPA catheter was  advanced. A Hemosplit 19 hemodialysis catheter was tunneled from the right anterior chest wall approach to the right IJ dermatotomy site. The MPA catheter was exchanged over an Amplatz wire for serial vascular dilators which allow placement of a peel-away sheath, through which the catheter was advanced under intermittent fluoroscopy, positioned with its tips in the proximal and midright atrium. Spot chest radiograph confirms good catheter position. No pneumothorax. Catheter was flushed and primed per protocol. Catheter secured externally with O Prolene sutures. The right IJ dermatotomy site was closed with Dermabond.  COMPLICATIONS: COMPLICATIONS None immediate  FLUOROSCOPY TIME:  48 seconds  COMPARISON:  None  IMPRESSION: 1. Technically successful placement of tunneled right IJ hemodialysis catheter with ultrasound and fluoroscopic guidance. Ready for routine use.  ACCESS: Remains approachable for percutaneous intervention as needed.   Electronically Signed   By: Arne Cleveland M.D.   On: 04/19/2014 12:10   Ir US Guide Vasc Access Right  04/19/2014   CLINICAL DATA:  Acute renal insufficiency, needs access for hemodialysis  EXAM: TUNNELED HEMODIALYSIS CATHETER PLACEMENT WITH ULTRASOUND AND FLUOROSCOPIC GUIDANCE  TECHNIQUE: The procedure, risks, benefits, and alternatives were explained to the patient. Questions regarding the procedure were encouraged and answered. The patient understands and consents to the procedure. As antibiotic prophylaxis, cefazolin 2 g was ordered pre-procedure and administered intravenously within one hour of incision.Patency of the right IJ vein was confirmed with ultrasound with image  documentation. An appropriate skin site was determined. Region was prepped using maximum barrier technique including cap and mask, sterile gown, sterile gloves, large sterile sheet, and Chlorhexidine as cutaneous antisepsis. The region was infiltrated locally with 1% lidocaine.  Intravenous Fentanyl and Versed were administered as conscious sedation during continuous cardiorespiratory monitoring by the radiology RN, with a total moderate sedation time of 25 minutes.  Under real-time ultrasound guidance, the right IJ vein was accessed with a 21 gauge micropuncture needle; the needle tip within the vein was confirmed with ultrasound image documentation. Needle exchanged over the 018 guidewire for transitional dilator, which allowed advancement of a Benson wire into the IVC. Over this, an MPA catheter was advanced. A Hemosplit 19 hemodialysis catheter was tunneled from the right anterior chest wall approach to the right IJ dermatotomy site. The MPA catheter was exchanged over an Amplatz wire for serial vascular dilators which allow placement of a peel-away sheath, through which the catheter was advanced under intermittent fluoroscopy, positioned with its tips in the proximal and midright atrium. Spot chest radiograph confirms good catheter position. No pneumothorax. Catheter was flushed and primed per protocol. Catheter secured externally with O Prolene sutures. The right IJ dermatotomy site was closed with Dermabond.  COMPLICATIONS: COMPLICATIONS None immediate  FLUOROSCOPY TIME:  48 seconds  COMPARISON:  None  IMPRESSION: 1. Technically successful placement of tunneled right IJ hemodialysis catheter with ultrasound and fluoroscopic guidance. Ready for routine use.  ACCESS: Remains approachable for percutaneous intervention as needed.   Electronically Signed   By: Arne Cleveland M.D.   On: 04/19/2014 12:10   Ir US Guide Bx Asp/drain  04/19/2014   CLINICAL DATA:  Acute renal insufficiency  COMPARISON:  CT  03/21/2008  TECHNIQUE: ULTRASOUND GUIDED RENAL CORE BIOPSY  Survey ultrasound was performed and an appropriate skin entry site was localized. Site was marked, prepped with Betadine, draped in usual sterile fashion, infiltrated locally with 1% lidocaine. Intravenous Fentanyl and Versed were administered as conscious sedation during continuous cardiorespiratory monitoring by the radiology RN with a total moderate sedation time of 25  minutes.  Under real time ultrasound guidance, a 15 gauge trocar needle was advanced to the margin of the lower pole left kidney. The 16 gauge core needle was coaxially advanced for 3 passes. The core samples were submitted to pathology. The patient tolerated procedure well.  COMPLICATIONS: COMPLICATIONS none  IMPRESSION: 1. Technically successful ultrasound-guided core renal biopsy , lower pole left kidney.   Electronically Signed   By: Arne Cleveland M.D.   On: 04/19/2014 12:12   Ct Biopsy  04/21/2014   CLINICAL DATA:  Multiple myeloma  EXAM: CT GUIDED RIGHT ILIAC BONE MARROW ASPIRATION AND CORE BIOPSY  Date:  12/11/201512/03/2014 10:04 am  Radiologist:  M. Daryll Brod, MD  Guidance:  CT  FLUOROSCOPY TIME:  NONE.  MEDICATIONS AND MEDICAL HISTORY: 1 mg Versed, 50 mcg fentanyl  ANESTHESIA/SEDATION: 15 min  CONTRAST:  None.  COMPLICATIONS: None immediate  PROCEDURE: Informed consent was obtained from the patient following explanation of the procedure, risks, benefits and alternatives. The patient understands, agrees and consents for the procedure. All questions were addressed. A time out was performed.  The patient was positioned prone and noncontrast localization CT was performed of the pelvis to demonstrate the iliac marrow spaces.  Maximal barrier sterile technique utilized including caps, mask, sterile gowns, sterile gloves, large sterile drape, hand hygiene, and betadine prep.  Under sterile conditions and local anesthesia, an 11 gauge coaxial bone biopsy needle was advanced into  the right iliac marrow space. Needle position was confirmed with CT imaging. Initially, bone marrow aspiration was performed. Next, the 11 gauge outer cannula was utilized to obtain a right iliac bone marrow core biopsy. Needle was removed. Hemostasis was obtained with compression. The patient tolerated the procedure well. Samples were prepared with the cytotechnologist. No immediate complications.  IMPRESSION: CT guided right iliac bone marrow aspiration and core biopsy.   Electronically Signed   By: Daryll Brod M.D.   On: 04/21/2014 10:13   Dg Chest Port 1 View  04/30/2014   CLINICAL DATA:  Shortness of breath and leukocytosis  EXAM: PORTABLE CHEST - 1 VIEW  COMPARISON:  April 18, 2014  FINDINGS: There is underlying emphysematous change. There is no edema or consolidation. There is interstitial prominence in the mid and lower lung zones, probably representing underlying fibrotic type change, stable. There is no frank edema or consolidation. Heart is upper normal in size. Pulmonary vascularity is within normal limits. Aorta appears somewhat tortuous but stable. Central catheter tip is in the superior vena cava. No pneumothorax. No adenopathy. No bone lesions. There is degenerative change in the thoracic spine.  IMPRESSION: Underlying emphysema. Changes of interstitial fibrosis, stable. No frank edema or consolidation.   Electronically Signed   By: Lowella Grip M.D.   On: 04/30/2014 08:03   Dg Bone Survey Met  04/20/2014   CLINICAL DATA:  80 year old male with multiple myeloma.  EXAM: METASTATIC BONE SURVEY  COMPARISON:  Recent prior osseous survey 04/13/2014  FINDINGS: Chest: No discrete lytic lesion in the visualized bones. Right IJ approach tunneled hemodialysis/pheresis catheter. The catheter tips project over the mid SVC. Mild cardiomegaly. Moderately large hiatal hernia. Atherosclerotic calcification noted in the transverse aorta. Stable appearance of left lingular opacity, background  bronchitic changes and interstitial prominence. No new acute cardiopulmonary finding.  Spine: Limited visualization of the cervical spine beyond the level of C6 secondary to overlap of the shoulder girdles. Multilevel cervical spondylosis. Right worse than left facet arthropathy. No discrete lytic osseous lesion. Atherosclerotic calcification present in the  region of the left carotid bifurcation. Multilevel degenerative spurring throughout the thoracic spine. No evidence of thoracic compression fracture. Degenerative spurring continues to the lumbar spine. There is moderate facet arthropathy at L4-L5 and L5-S1. No discrete lytic lesion.  Skull: No discrete osseous lesion. Normal aeration of the paranasal sinuses.  Upper extremities: No discrete lytic osseous lesion. No acute fracture or malalignment. Nonunion of remote healed fracture involving the right ulnar styloid.  Pelvis: No discrete lytic osseous lesion. The bones are mildly osteopenic. Brachy therapy seeds project over the prostate gland. Mild bilateral hip joint osteoarthritis.  Lower extremities: No discrete lytic lesion. Mild scattered atherosclerotic vascular calcifications.  IMPRESSION: No discrete lytic lesion to suggest plasmacytoma or focal multiple myeloma.  Additional ancillary findings as above.   Electronically Signed   By: Jacqulynn Cadet M.D.   On: 04/20/2014 10:50    Oren Binet, MD  [facility] Pager:[phone]  If 7PM-7AM, please contact night-coverage [email or web address] Password [email or web address] 05/01/2014, 12:42 PM   LOS: 13 days

## 2014-05-01 NOTE — Progress Notes (Signed)
   Subjective:    Patient ID: Miguel Compton, male    DOB: [birth year], 80 y.o.   MRN: [id number]  HPI: sitting up in bed. Underwent dialysis today. Tired    Review of Systems: tiredness     Objective:   Physical Exam  Heart: S1 S2 Nl Lungs: clear Abd: soft Ext No edema Neuro: Intact     Assessment & Plan:  1. Multiple meloma: IgG Lambda: Increase in lambda light chain noted. SPEP is pending. I discussed the lab results with the patient and his family. Will increase velcade to 1.6 mg/m2 and repeat labs in 2 weeks  2. 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

## 2014-05-01 NOTE — Procedures (Signed)
Low BP with HD .  Has 1+ edema but excellent oxygenation.  Will reduce goal.  I have requested and awaiting final renal biopsy report. Jnae Thomaston C

## 2014-05-01 NOTE — Progress Notes (Signed)
Physical Therapy Treatment Patient Details Name: Miguel Compton MRN: [id number] DOB: December 06, 1921 Today's Date: 05/01/2014    History of Present Illness Pt is a 80 y.o. male with Past medical history of COPD, dementia, depression, CVA, overflow incontinence. The patient is presenting with complaints of an abnormal lab. He mentions that since last 2 months he has been having generalized malaise, leg swelling, and poor appetite with weight loss as throughout the year he has been busy taking care of his wife who has been hospitalized multiple times. He has noticed he has some right-sided flank pain ongoing since last one week. He also mentions that he has occasional episodes of constipation followed by episodes of bowel movements which are consistent only mucousy output for last a year or so.    PT Comments    Pt reports that chemo has really worn him out lately. Pt appears very fatigued and weakened during session. Focus of session was to clean up patient as he had difficulty using urinal and legs/gown were soaked with urine. Pt was able to stand and hold static standing x2 at edge of chair, however tolerance for functional activity is low and pt was too fatigued to participate in seated therapeutic exercise and declined ambulation at this time. Will continue to follow.    Follow Up Recommendations  Home health PT;Supervision - Intermittent     Equipment Recommendations  None recommended by PT    Recommendations for Other Services       Precautions / Restrictions Precautions Precautions: Fall Restrictions Weight Bearing Restrictions: No    Mobility  Bed Mobility               General bed mobility comments: Pt sitting up in recliner chair upon PT arrival.   Transfers Overall transfer level: Needs assistance Equipment used: 1 person hand held assist Transfers: Sit to/from Stand Sit to Stand: Supervision         General transfer comment: Supervision and close guard for  safety. Pt very fatigued and required increased time to achieve full standing. Sit<>stand demonstrated x2 from recliner.   Ambulation/Gait             General Gait Details: Deferred this session due to fatigue.    Stairs            Wheelchair Mobility    Modified Rankin (Stroke Patients Only)       Balance Overall balance assessment: Needs assistance Sitting-balance support: Feet supported;No upper extremity supported Sitting balance-Leahy Scale: Fair     Standing balance support: No upper extremity supported;During functional activity Standing balance-Leahy Scale: Fair                      Cognition Arousal/Alertness: Awake/alert Behavior During Therapy: WFL for tasks assessed/performed Overall Cognitive Status: Within Functional Limits for tasks assessed                      Exercises      General Comments        Pertinent Vitals/Pain Pain Assessment: No/denies pain    Home Living                      Prior Function            PT Goals (current goals can now be found in the care plan section) Acute Rehab PT Goals Patient Stated Goal: Return home to his wife of 18 years PT Goal Formulation: With  patient Time For Goal Achievement: 05/08/14 Potential to Achieve Goals: Good Progress towards PT goals: Progressing toward goals    Frequency  Min 3X/week    PT Plan Current plan remains appropriate    Co-evaluation             End of Session   Activity Tolerance: Patient limited by fatigue Patient left: in chair;with call bell/phone within reach     Time: 3009-2330 PT Time Calculation (min) (ACUTE ONLY): 13 min  Charges:  $Therapeutic Activity: 8-22 mins                    G Codes:      Rolinda Roan 28-May-2014, 4:23 PM   Rolinda Roan, PT, DPT Acute Rehabilitation Services Pager: [phone]

## 2014-05-02 LAB — URINALYSIS, ROUTINE W REFLEX MICROSCOPIC
BILIRUBIN URINE: NEGATIVE
GLUCOSE, UA: NEGATIVE mg/dL
Ketones, ur: NEGATIVE mg/dL
NITRITE: NEGATIVE
PH: 7 (ref 5.0–8.0)
Protein, ur: 30 mg/dL — AB
SPECIFIC GRAVITY, URINE: 1.008 (ref 1.005–1.030)
Urobilinogen, UA: 0.2 mg/dL (ref 0.0–1.0)

## 2014-05-02 LAB — RENAL FUNCTION PANEL
Albumin: 1.6 g/dL — ABNORMAL LOW (ref 3.5–5.2)
Anion gap: 13 (ref 5–15)
BUN: 46 mg/dL — ABNORMAL HIGH (ref 6–23)
CALCIUM: 7.3 mg/dL — AB (ref 8.4–10.5)
CO2: 24 mmol/L (ref 19–32)
Chloride: 97 mEq/L (ref 96–112)
Creatinine, Ser: 6.1 mg/dL — ABNORMAL HIGH (ref 0.50–1.35)
GFR, EST AFRICAN AMERICAN: 8 mL/min — AB (ref >90)
GFR, EST NON AFRICAN AMERICAN: 7 mL/min — AB (ref >90)
GLUCOSE: 112 mg/dL — AB (ref 70–99)
PHOSPHORUS: 5.2 mg/dL — AB (ref 2.3–4.6)
POTASSIUM: 3.9 mmol/L (ref 3.5–5.1)
Sodium: 134 mmol/L — ABNORMAL LOW (ref 135–145)

## 2014-05-02 LAB — PROTEIN ELECTROPHORESIS, SERUM
Albumin ELP: 37.2 % — ABNORMAL LOW (ref 55.8–66.1)
Alpha-1-Globulin: 5.4 % — ABNORMAL HIGH (ref 2.9–4.9)
Alpha-2-Globulin: 13 % — ABNORMAL HIGH (ref 7.1–11.8)
BETA 2: 37 % — AB (ref 3.2–6.5)
Beta Globulin: 6 % (ref 4.7–7.2)
GAMMA GLOBULIN: 1.4 % — AB (ref 11.1–18.8)
M-Spike, %: 0.13 g/dL
Total Protein ELP: 7 g/dL (ref 6.0–8.3)

## 2014-05-02 LAB — URINE MICROSCOPIC-ADD ON

## 2014-05-02 MED ORDER — DEXAMETHASONE 6 MG PO TABS
20.0000 mg | ORAL_TABLET | ORAL | Status: DC
  Administered 2014-05-04: 20 mg via ORAL
  Filled 2014-05-02: qty 1

## 2014-05-02 MED ORDER — ACYCLOVIR 200 MG PO CAPS
400.0000 mg | ORAL_CAPSULE | Freq: Every day | ORAL | Status: DC
  Administered 2014-05-03: 400 mg via ORAL
  Filled 2014-05-02 (×2): qty 2

## 2014-05-02 NOTE — Progress Notes (Signed)
PT Cancellation Note  Patient Details Name: MAGIC MOHLER MRN: [id number] DOB: [birth year]   Cancelled Treatment:    Reason Eval/Treat Not Completed: Fatigue/lethargy limiting ability to participate.  Discussed with RN who states that she ambulated with pt earlier today and pt demonstrated extreme fatigue, requiring urgent seated rest break at doorway of room. Pt has voiced increased fatigue past few sessions due to chemo, and do not feel further mobility would be beneficial at this time. Will hold PT today and resume tomorrow.    Rolinda Roan 05/02/2014, 3:29 PM   Rolinda Roan, PT, DPT Acute Rehabilitation Services Pager: [phone]

## 2014-05-02 NOTE — Progress Notes (Signed)
PATIENT DETAILS Name: Miguel Compton Age: 80 y.o. Sex: male Date of Birth: 1922/01/18 Admit Date: 04/18/2014 Admitting Physician Costin Karlyne Greenspan, MD TKZ:SWFU, Jenny Reichmann, MD  Brief Narrative: 80 year old highly functional male with history of COPD, hypertension, dyslipidemia and mild early dementia along with CVA in the past was admitted from PCP office for worsening renal failure. Patient was recently diagnosed with multiple myeloma. Initially thought to have renal failure from multiple myeloma, however preliminary pathology from kidney biopsy does not entirely support this. Also underwent bone marrow biopsy which conformed Myeloma. Continues to have significant renal dysfunction, being followed by both nephrology and oncology. Has been started on Decadron and Velcade by oncology. Nephrology following and patient has undergone hemodialysis on 04/22/14, current plans are to follow renal function with prn dialysis and close monitoring  Subjective: No major issues overnight.  Assessment/Plan: Principal Problem: XNA:[id number] suspicion for myeloma kidney, however biopsy negative myeloma cast nephropathy.Etiology of renal failure unclear, underwent  hemodialysis X 3 so far, unfortunately given persistently elevated renal function, suspect poor renal prognosis, suspect patient will be declared ESRD soon. In the meantime, we will continue to monitor and follow  Active Problems: Leukocytosis: Likely due to steroid use and possible multiple myeloma. No fever,no signs of infection. Blood culture on 12/20 neg so far. Remain non toxic appearing, without any fever. Continue to monitor off Abx.  Mild Hyperkalemia: persists inspite of Kayexalate X 2,now resolved s/p  HD on 12/21-repeat lytes in am   Multiple myeloma:Hem/Onc consulted, underwent bone marrow biopsy on 04/21/14, pathology confirms myeloma.Has been started on Decadron and Velcade.Oncology following-Dr Lindi Adie planning chemoTx  every Friday  (recieved 2 cycles so far). On prophylactic Acyclovir.  History of CVA:Non focal exam, ASA held for renal bx-has been resumed  Dyslipidemia: On statin -continue.  Mild dementia: On Aricept.Currently awake/alert X4  Essential hypertension: BP controlled, continue Coreg, Amlodipine discontinued-as vol being removed with HD. Marland Kitchen   Constipation: continue  miralax,senokot.Prn Lactulose. Having almost daily BM's  Disposition: Remain inpatient  Antibiotics:  See below  Anti-infectives    Start     Dose/Rate Route Frequency Ordered Stop   05/03/14 1600  acyclovir (ZOVIRAX) 200 MG capsule 400 mg     400 mg Oral Daily 05/02/14 1003     04/21/14 2200  acyclovir (ZOVIRAX) 200 MG capsule 400 mg  Status:  Discontinued     400 mg Oral 2 times daily 04/21/14 1658 05/02/14 1003   04/21/14 0000  acyclovir (ZOVIRAX) 400 MG tablet     400 mg Oral 2 times daily 04/21/14 1659     04/19/14 0930  ceFAZolin (ANCEF) IVPB 2 g/50 mL premix    Comments:  Hang ON CALL to xray 12/9   2 g100 mL/hr over 30 Minutes Intravenous On call 04/19/14 0917 04/19/14 1133      DVT Prophylaxis: Prophylactic  Heparin  Code Status: Full code   Family Communication Son in law at bedside on 12/17  Procedures:  None  CONSULTS:  nephrology and hematology/oncology   MEDICATIONS: Scheduled Meds: . [START ON 05/03/2014] acyclovir  400 mg Oral Daily  . aspirin  81 mg Oral Daily  . calcitRIOL  0.5 mcg Oral Q M,W,F-HD  . carvedilol  3.125 mg Oral BID WC  . darbepoetin (ARANESP) injection - DIALYSIS  150 mcg Intravenous Q Fri-HD  . [START ON 05/04/2014] dexamethasone  20 mg Oral Weekly  . feeding supplement (NEPRO CARB STEADY)  237 mL Oral Q1500  .  fluticasone  1 puff Inhalation BID  . heparin subcutaneous  5,000 Units Subcutaneous 3 times per day  . lanthanum  1,000 mg Oral TID WC  . multivitamin  1 tablet Oral QHS  . pantoprazole  40 mg Oral Daily  . polyethylene glycol  17 g Oral BID  . pravastatin  80 mg  Oral q1800  . senna  2 tablet Oral BID  . sodium chloride  3 mL Intravenous Q12H   Continuous Infusions:  PRN Meds:.albuterol, ALPRAZolam, bisacodyl, HYDROcodone-acetaminophen, lactulose, prochlorperazine    PHYSICAL EXAM: Vital signs in last 24 hours: Filed Vitals:   05/01/14 1047 05/01/14 2119 05/02/14 0454 05/02/14 0949  BP: 149/64 117/59 120/54 107/40  Pulse: 70 69 70 72  Temp: 97.4 F (36.3 C) 98.5 F (36.9 C) 97.8 F (36.6 C) 98.6 F (37 C)  TempSrc: Oral Oral Oral Oral  Resp: $Remo'18 18 18 17  'zuPps$ Height:      Weight:  85.1 kg (187 lb 9.8 oz)    SpO2: 96% 98% 93% 95%    Weight change: 1 kg (2 lb 3.3 oz) Filed Weights   04/30/14 2100 05/01/14 0636 05/01/14 2119  Weight: 84.1 kg (185 lb 6.5 oz) 85.1 kg (187 lb 9.8 oz) 85.1 kg (187 lb 9.8 oz)   Body mass index is 26.92 kg/(m^2).   Gen Exam: Awake and alert with clear speech.   Neck: Supple, No JVD.   Chest: B/L Clear.  No rales or rhonchi CVS: S1 S2 Regular, no murmurs.  Abdomen: soft, BS +, non tender, non distended.  Extremities: no edema, lower extremities warm to touch. Neurologic: Non Focal.   Skin: No Rash.   Wounds: N/A.    Intake/Output from previous day:  Intake/Output Summary (Last 24 hours) at 05/02/14 1045 Last data filed at 05/02/14 0949  Gross per 24 hour  Intake    840 ml  Output    176 ml  Net    664 ml     LAB RESULTS: CBC  Recent Labs Lab 04/26/14 0554 04/28/14 0500 04/30/14 0448 05/01/14 0500  WBC 19.2* 24.7* 33.8* 24.9*  HGB 9.8* 9.0* 10.0* 8.9*  HCT 29.0* 27.4* 29.7* 26.8*  PLT 320 282 216 186  MCV 87.6 89.0 92.0 89.6  MCH 29.6 29.2 31.0 29.8  MCHC 33.8 32.8 33.7 33.2  RDW 13.0 13.1 13.4 13.2  LYMPHSABS  --   --  0.7 2.4  MONOABS  --   --  2.0* 1.6*  EOSABS  --   --  0.0 0.1  BASOSABS  --   --  0.0 0.0    Chemistries   Recent Labs Lab 04/29/14 0445 04/29/14 1640 04/30/14 0448 05/01/14 0500 05/02/14 0926  NA 132* 135* 135* 133* 134*  K 5.8* 5.0 5.5* 5.1 3.9  CL  94* 94* 94* 91* 97  CO2 $Re'23 21 20 23 24  'eXh$ GLUCOSE 90 163* 87 75 112*  BUN 73* 86* 96* 108* 46*  CREATININE 5.38* 6.08* 6.64* 8.44* 6.10*  CALCIUM 8.2* 8.2* 7.9* 7.6* 7.3*    CBG: No results for input(s): GLUCAP in the last 168 hours.  GFR Estimated Creatinine Clearance: 8.3 mL/min (by C-G formula based on Cr of 6.1).  Coagulation profile No results for input(s): INR, PROTIME in the last 168 hours.  Cardiac Enzymes No results for input(s): CKMB, TROPONINI, MYOGLOBIN in the last 168 hours.  Invalid input(s): CK  Invalid input(s): POCBNP No results for input(s): DDIMER in the last 72 hours. No results for input(s):  HGBA1C in the last 72 hours. No results for input(s): CHOL, HDL, LDLCALC, TRIG, CHOLHDL, LDLDIRECT in the last 72 hours. No results for input(s): TSH, T4TOTAL, T3FREE, THYROIDAB in the last 72 hours.  Invalid input(s): FREET3 No results for input(s): VITAMINB12, FOLATE, FERRITIN, TIBC, IRON, RETICCTPCT in the last 72 hours. No results for input(s): LIPASE, AMYLASE in the last 72 hours.  Urine Studies No results for input(s): UHGB, CRYS in the last 72 hours.  Invalid input(s): UACOL, UAPR, USPG, UPH, UTP, UGL, UKET, UBIL, UNIT, UROB, ULEU, UEPI, UWBC, URBC, UBAC, CAST, UCOM, BILUA  MICROBIOLOGY: Recent Results (from the past 240 hour(s))  Culture, blood (routine x 2)     Status: None (Preliminary result)   Collection Time: 04/30/14  8:30 AM  Result Value Ref Range Status   Specimen Description RIGHT ANTECUBITAL  Final   Special Requests BOTTLES DRAWN AEROBIC AND ANAEROBIC 10CC  Final   Culture  Setup Time   Final    04/30/2014 21:04 Performed at [facility]    Culture   Final           BLOOD CULTURE RECEIVED NO GROWTH TO DATE CULTURE WILL BE HELD FOR 5 DAYS BEFORE ISSUING A FINAL NEGATIVE REPORT Performed at [facility]    Report Status PENDING  Incomplete  Culture, blood (routine x 2)     Status: None (Preliminary result)   Collection  Time: 04/30/14  8:38 AM  Result Value Ref Range Status   Specimen Description RIGHT ANTECUBITAL  Final   Special Requests BOTTLES DRAWN AEROBIC AND ANAEROBIC 10CC  Final   Culture  Setup Time   Final    04/30/2014 21:04 Performed at [facility]    Culture   Final           BLOOD CULTURE RECEIVED NO GROWTH TO DATE CULTURE WILL BE HELD FOR 5 DAYS BEFORE ISSUING A FINAL NEGATIVE REPORT Performed at [facility]    Report Status PENDING  Incomplete    RADIOLOGY STUDIES/RESULTS: Dg Chest 2 View  04/18/2014   CLINICAL DATA:  ABNORMAL LAB, shortness of breath, COPD  EXAM: CHEST - 2 VIEW  COMPARISON:  08/25/2011  FINDINGS: Coarse bilateral interstitial opacities left greater than right, with relative sparing of the apices, increased since previous exam. Heart size is normal. No effusion. Visualized skeletal structures are unremarkable.  IMPRESSION: 1. Progressive coarse bilateral asymmetric interstitial opacities, probably largely chronic.   Electronically Signed   By: Arne Cleveland M.D.   On: 04/18/2014 15:36   Ir Fluoro Guide Cv Line Right  04/19/2014   CLINICAL DATA:  Acute renal insufficiency, needs access for hemodialysis  EXAM: TUNNELED HEMODIALYSIS CATHETER PLACEMENT WITH ULTRASOUND AND FLUOROSCOPIC GUIDANCE  TECHNIQUE: The procedure, risks, benefits, and alternatives were explained to the patient. Questions regarding the procedure were encouraged and answered. The patient understands and consents to the procedure. As antibiotic prophylaxis, cefazolin 2 g was ordered pre-procedure and administered intravenously within one hour of incision.Patency of the right IJ vein was confirmed with ultrasound with image documentation. An appropriate skin site was determined. Region was prepped using maximum barrier technique including cap and mask, sterile gown, sterile gloves, large sterile sheet, and Chlorhexidine as cutaneous antisepsis. The region was infiltrated locally with 1%  lidocaine.  Intravenous Fentanyl and Versed were administered as conscious sedation during continuous cardiorespiratory monitoring by the radiology RN, with a total moderate sedation time of 25 minutes.  Under real-time ultrasound guidance, the right IJ vein was  accessed with a 21 gauge micropuncture needle; the needle tip within the vein was confirmed with ultrasound image documentation. Needle exchanged over the 018 guidewire for transitional dilator, which allowed advancement of a Benson wire into the IVC. Over this, an MPA catheter was advanced. A Hemosplit 19 hemodialysis catheter was tunneled from the right anterior chest wall approach to the right IJ dermatotomy site. The MPA catheter was exchanged over an Amplatz wire for serial vascular dilators which allow placement of a peel-away sheath, through which the catheter was advanced under intermittent fluoroscopy, positioned with its tips in the proximal and midright atrium. Spot chest radiograph confirms good catheter position. No pneumothorax. Catheter was flushed and primed per protocol. Catheter secured externally with O Prolene sutures. The right IJ dermatotomy site was closed with Dermabond.  COMPLICATIONS: COMPLICATIONS None immediate  FLUOROSCOPY TIME:  48 seconds  COMPARISON:  None  IMPRESSION: 1. Technically successful placement of tunneled right IJ hemodialysis catheter with ultrasound and fluoroscopic guidance. Ready for routine use.  ACCESS: Remains approachable for percutaneous intervention as needed.   Electronically Signed   By: Arne Cleveland M.D.   On: 04/19/2014 12:10   Ir US Guide Vasc Access Right  04/19/2014   CLINICAL DATA:  Acute renal insufficiency, needs access for hemodialysis  EXAM: TUNNELED HEMODIALYSIS CATHETER PLACEMENT WITH ULTRASOUND AND FLUOROSCOPIC GUIDANCE  TECHNIQUE: The procedure, risks, benefits, and alternatives were explained to the patient. Questions regarding the procedure were encouraged and answered. The patient  understands and consents to the procedure. As antibiotic prophylaxis, cefazolin 2 g was ordered pre-procedure and administered intravenously within one hour of incision.Patency of the right IJ vein was confirmed with ultrasound with image documentation. An appropriate skin site was determined. Region was prepped using maximum barrier technique including cap and mask, sterile gown, sterile gloves, large sterile sheet, and Chlorhexidine as cutaneous antisepsis. The region was infiltrated locally with 1% lidocaine.  Intravenous Fentanyl and Versed were administered as conscious sedation during continuous cardiorespiratory monitoring by the radiology RN, with a total moderate sedation time of 25 minutes.  Under real-time ultrasound guidance, the right IJ vein was accessed with a 21 gauge micropuncture needle; the needle tip within the vein was confirmed with ultrasound image documentation. Needle exchanged over the 018 guidewire for transitional dilator, which allowed advancement of a Benson wire into the IVC. Over this, an MPA catheter was advanced. A Hemosplit 19 hemodialysis catheter was tunneled from the right anterior chest wall approach to the right IJ dermatotomy site. The MPA catheter was exchanged over an Amplatz wire for serial vascular dilators which allow placement of a peel-away sheath, through which the catheter was advanced under intermittent fluoroscopy, positioned with its tips in the proximal and midright atrium. Spot chest radiograph confirms good catheter position. No pneumothorax. Catheter was flushed and primed per protocol. Catheter secured externally with O Prolene sutures. The right IJ dermatotomy site was closed with Dermabond.  COMPLICATIONS: COMPLICATIONS None immediate  FLUOROSCOPY TIME:  48 seconds  COMPARISON:  None  IMPRESSION: 1. Technically successful placement of tunneled right IJ hemodialysis catheter with ultrasound and fluoroscopic guidance. Ready for routine use.  ACCESS: Remains  approachable for percutaneous intervention as needed.   Electronically Signed   By: Arne Cleveland M.D.   On: 04/19/2014 12:10   Ir US Guide Bx Asp/drain  04/19/2014   CLINICAL DATA:  Acute renal insufficiency  COMPARISON:  CT 03/21/2008  TECHNIQUE: ULTRASOUND GUIDED RENAL CORE BIOPSY  Survey ultrasound was performed and an appropriate skin entry  site was localized. Site was marked, prepped with Betadine, draped in usual sterile fashion, infiltrated locally with 1% lidocaine. Intravenous Fentanyl and Versed were administered as conscious sedation during continuous cardiorespiratory monitoring by the radiology RN with a total moderate sedation time of 25 minutes.  Under real time ultrasound guidance, a 15 gauge trocar needle was advanced to the margin of the lower pole left kidney. The 16 gauge core needle was coaxially advanced for 3 passes. The core samples were submitted to pathology. The patient tolerated procedure well.  COMPLICATIONS: COMPLICATIONS none  IMPRESSION: 1. Technically successful ultrasound-guided core renal biopsy , lower pole left kidney.   Electronically Signed   By: Arne Cleveland M.D.   On: 04/19/2014 12:12   Ct Biopsy  04/21/2014   CLINICAL DATA:  Multiple myeloma  EXAM: CT GUIDED RIGHT ILIAC BONE MARROW ASPIRATION AND CORE BIOPSY  Date:  12/11/201512/03/2014 10:04 am  Radiologist:  M. Daryll Brod, MD  Guidance:  CT  FLUOROSCOPY TIME:  NONE.  MEDICATIONS AND MEDICAL HISTORY: 1 mg Versed, 50 mcg fentanyl  ANESTHESIA/SEDATION: 15 min  CONTRAST:  None.  COMPLICATIONS: None immediate  PROCEDURE: Informed consent was obtained from the patient following explanation of the procedure, risks, benefits and alternatives. The patient understands, agrees and consents for the procedure. All questions were addressed. A time out was performed.  The patient was positioned prone and noncontrast localization CT was performed of the pelvis to demonstrate the iliac marrow spaces.  Maximal barrier sterile  technique utilized including caps, mask, sterile gowns, sterile gloves, large sterile drape, hand hygiene, and betadine prep.  Under sterile conditions and local anesthesia, an 11 gauge coaxial bone biopsy needle was advanced into the right iliac marrow space. Needle position was confirmed with CT imaging. Initially, bone marrow aspiration was performed. Next, the 11 gauge outer cannula was utilized to obtain a right iliac bone marrow core biopsy. Needle was removed. Hemostasis was obtained with compression. The patient tolerated the procedure well. Samples were prepared with the cytotechnologist. No immediate complications.  IMPRESSION: CT guided right iliac bone marrow aspiration and core biopsy.   Electronically Signed   By: Daryll Brod M.D.   On: 04/21/2014 10:13   Dg Chest Port 1 View  04/30/2014   CLINICAL DATA:  Shortness of breath and leukocytosis  EXAM: PORTABLE CHEST - 1 VIEW  COMPARISON:  April 18, 2014  FINDINGS: There is underlying emphysematous change. There is no edema or consolidation. There is interstitial prominence in the mid and lower lung zones, probably representing underlying fibrotic type change, stable. There is no frank edema or consolidation. Heart is upper normal in size. Pulmonary vascularity is within normal limits. Aorta appears somewhat tortuous but stable. Central catheter tip is in the superior vena cava. No pneumothorax. No adenopathy. No bone lesions. There is degenerative change in the thoracic spine.  IMPRESSION: Underlying emphysema. Changes of interstitial fibrosis, stable. No frank edema or consolidation.   Electronically Signed   By: Lowella Grip M.D.   On: 04/30/2014 08:03   Dg Bone Survey Met  04/20/2014   CLINICAL DATA:  80 year old male with multiple myeloma.  EXAM: METASTATIC BONE SURVEY  COMPARISON:  Recent prior osseous survey 04/13/2014  FINDINGS: Chest: No discrete lytic lesion in the visualized bones. Right IJ approach tunneled  hemodialysis/pheresis catheter. The catheter tips project over the mid SVC. Mild cardiomegaly. Moderately large hiatal hernia. Atherosclerotic calcification noted in the transverse aorta. Stable appearance of left lingular opacity, background bronchitic changes and interstitial prominence.  No new acute cardiopulmonary finding.  Spine: Limited visualization of the cervical spine beyond the level of C6 secondary to overlap of the shoulder girdles. Multilevel cervical spondylosis. Right worse than left facet arthropathy. No discrete lytic osseous lesion. Atherosclerotic calcification present in the region of the left carotid bifurcation. Multilevel degenerative spurring throughout the thoracic spine. No evidence of thoracic compression fracture. Degenerative spurring continues to the lumbar spine. There is moderate facet arthropathy at L4-L5 and L5-S1. No discrete lytic lesion.  Skull: No discrete osseous lesion. Normal aeration of the paranasal sinuses.  Upper extremities: No discrete lytic osseous lesion. No acute fracture or malalignment. Nonunion of remote healed fracture involving the right ulnar styloid.  Pelvis: No discrete lytic osseous lesion. The bones are mildly osteopenic. Brachy therapy seeds project over the prostate gland. Mild bilateral hip joint osteoarthritis.  Lower extremities: No discrete lytic lesion. Mild scattered atherosclerotic vascular calcifications.  IMPRESSION: No discrete lytic lesion to suggest plasmacytoma or focal multiple myeloma.  Additional ancillary findings as above.   Electronically Signed   By: Jacqulynn Cadet M.D.   On: 04/20/2014 10:50    Oren Binet, MD  [facility] Pager:[phone]  If 7PM-7AM, please contact night-coverage [email or web address] Password [email or web address] 05/02/2014, 10:45 AM   LOS: 14 days

## 2014-05-02 NOTE — Progress Notes (Signed)
Assessment/Plan: 1 AKI nonoliguric. 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    I suspect given his age and comorbidity that the chance for recovery of the renal failure will be small, therefore will begin CLIP process for OP HD, next HD Wed.  I will discuss with Dr. Malvin Johns at [facility]) about need for an AVF at this time vs. awaiting recovery considering morbidity of surgery assoc with his age. 2 MM for chemo 3 GERD 4 Anemia epo 5 HPTH Vit D P HD Wed   Subjective: Interval History: Final Renal Biopsy: "Mild lambda light chain disaease, moderately severe acute and chronic tubular injury with interstitial fibrosis and edema, moderate arteriosclerosis, granular cast material of undetermined significance"  However "this is a difficult specimen to interpret"  Objective: Vital signs in last 24 hours: Temp:  [97.4 F (36.3 C)-98.5 F (36.9 C)] 97.8 F (36.6 C) (12/22 0454) Pulse Rate:  [65-71] 70 (12/22 0454) Resp:  [15-18] 18 (12/22 0454) BP: (117-149)/(54-64) 120/54 mmHg (12/22 0454) SpO2:  [93 %-98 %] 93 % (12/22 0454) Weight:  [85.1 kg (187 lb 9.8 oz)] 85.1 kg (187 lb 9.8 oz) (12/21 2119) Weight change: 1 kg (2 lb 3.3 oz)  Intake/Output from previous day: 12/21 0701 - 12/22 0700 In: 480 [P.O.:480] Out: 175 [Urine:175] Intake/Output this shift:    General appearance: alert and cooperative Extremities: extremities normal, atraumatic, no cyanosis or edema  Right chest PC  Lab Results:  Recent Labs  04/30/14 0448 05/01/14 0500  WBC 33.8* 24.9*  HGB 10.0* 8.9*  HCT 29.7* 26.8*  PLT 216 186   BMET:  Recent Labs  04/30/14 0448 05/01/14 0500  NA 135* 133*  K 5.5* 5.1  CL 94* 91*  CO2 20 23  GLUCOSE 87 75  BUN 96* 108*  CREATININE 6.64* 8.44*  CALCIUM 7.9* 7.6*   No results for input(s): PTH in the last 72 hours. Iron Studies: No results for input(s): IRON, TIBC, TRANSFERRIN, FERRITIN in the last  72 hours. Studies/Results: No results found.  Scheduled: . acyclovir  400 mg Oral BID  . aspirin  81 mg Oral Daily  . calcitRIOL  0.5 mcg Oral Q M,W,F-HD  . carvedilol  3.125 mg Oral BID WC  . darbepoetin (ARANESP) injection - DIALYSIS  150 mcg Intravenous Q Fri-HD  . dexamethasone  20 mg Oral Weekly  . feeding supplement (NEPRO CARB STEADY)  237 mL Oral Q1500  . fluticasone  1 puff Inhalation BID  . heparin subcutaneous  5,000 Units Subcutaneous 3 times per day  . lanthanum  1,000 mg Oral TID WC  . multivitamin  1 tablet Oral QHS  . pantoprazole  40 mg Oral Daily  . polyethylene glycol  17 g Oral BID  . pravastatin  80 mg Oral q1800  . senna  2 tablet Oral BID  . sodium chloride  3 mL Intravenous Q12H     LOS: 14 days   Miguel Compton C 05/02/2014,9:13 AM

## 2014-05-03 LAB — PREPARE RBC (CROSSMATCH)

## 2014-05-03 LAB — RENAL FUNCTION PANEL
ANION GAP: 13 (ref 5–15)
Albumin: 1.5 g/dL — ABNORMAL LOW (ref 3.5–5.2)
BUN: 60 mg/dL — ABNORMAL HIGH (ref 6–23)
CO2: 25 mmol/L (ref 19–32)
Calcium: 7.1 mg/dL — ABNORMAL LOW (ref 8.4–10.5)
Chloride: 93 mEq/L — ABNORMAL LOW (ref 96–112)
Creatinine, Ser: 7.62 mg/dL — ABNORMAL HIGH (ref 0.50–1.35)
GFR calc non Af Amer: 5 mL/min — ABNORMAL LOW (ref >90)
GFR, EST AFRICAN AMERICAN: 6 mL/min — AB (ref >90)
Glucose, Bld: 90 mg/dL (ref 70–99)
POTASSIUM: 3.3 mmol/L — AB (ref 3.5–5.1)
Phosphorus: 5.6 mg/dL — ABNORMAL HIGH (ref 2.3–4.6)
SODIUM: 131 mmol/L — AB (ref 135–145)

## 2014-05-03 LAB — CBC WITH DIFFERENTIAL/PLATELET
Basophils Absolute: 0 10*3/uL (ref 0.0–0.1)
Basophils Relative: 0 % (ref 0–1)
Eosinophils Absolute: 0.2 10*3/uL (ref 0.0–0.7)
Eosinophils Relative: 1 % (ref 0–5)
HEMATOCRIT: 23.6 % — AB (ref 39.0–52.0)
Hemoglobin: 7.9 g/dL — ABNORMAL LOW (ref 13.0–17.0)
LYMPHS ABS: 2.1 10*3/uL (ref 0.7–4.0)
LYMPHS PCT: 9 % — AB (ref 12–46)
MCH: 30.9 pg (ref 26.0–34.0)
MCHC: 33.5 g/dL (ref 30.0–36.0)
MCV: 92.2 fL (ref 78.0–100.0)
MONO ABS: 1.5 10*3/uL — AB (ref 0.1–1.0)
MONOS PCT: 6 % (ref 3–12)
NEUTROS PCT: 84 % — AB (ref 43–77)
Neutro Abs: 19.4 10*3/uL — ABNORMAL HIGH (ref 1.7–7.7)
Platelets: 147 10*3/uL — ABNORMAL LOW (ref 150–400)
RBC: 2.56 MIL/uL — AB (ref 4.22–5.81)
RDW: 13.5 % (ref 11.5–15.5)
WBC: 23.2 10*3/uL — ABNORMAL HIGH (ref 4.0–10.5)

## 2014-05-03 MED ORDER — NEPRO/CARBSTEADY PO LIQD: 237.0000 mL | ORAL | Status: DC | PRN

## 2014-05-03 MED ORDER — HEPARIN SODIUM (PORCINE) 1000 UNIT/ML DIALYSIS
100.0000 [IU]/kg | INTRAMUSCULAR | Status: DC | PRN
  Filled 2014-05-03: qty 9

## 2014-05-03 MED ORDER — PENTAFLUOROPROP-TETRAFLUOROETH EX AERO: 1.0000 "application " | INHALATION_SPRAY | CUTANEOUS | Status: DC | PRN

## 2014-05-03 MED ORDER — ALTEPLASE 2 MG IJ SOLR
2.0000 mg | Freq: Once | INTRAMUSCULAR | Status: AC | PRN
  Filled 2014-05-03: qty 2

## 2014-05-03 MED ORDER — LIDOCAINE-PRILOCAINE 2.5-2.5 % EX CREA: 1.0000 "application " | TOPICAL_CREAM | CUTANEOUS | Status: DC | PRN

## 2014-05-03 MED ORDER — SODIUM CHLORIDE 0.9 % IV SOLN: 100.0000 mL | INTRAVENOUS | Status: DC | PRN

## 2014-05-03 MED ORDER — SODIUM CHLORIDE 0.9 % IV SOLN: Freq: Once | INTRAVENOUS | Status: DC

## 2014-05-03 MED ORDER — LIDOCAINE HCL (PF) 1 % IJ SOLN: 5.0000 mL | INTRAMUSCULAR | Status: DC | PRN

## 2014-05-03 MED ORDER — HEPARIN SODIUM (PORCINE) 1000 UNIT/ML DIALYSIS
20.0000 [IU]/kg | INTRAMUSCULAR | Status: DC | PRN
  Filled 2014-05-03: qty 2

## 2014-05-03 MED ORDER — HEPARIN SODIUM (PORCINE) 1000 UNIT/ML DIALYSIS
1000.0000 [IU] | INTRAMUSCULAR | Status: DC | PRN
  Filled 2014-05-03: qty 1

## 2014-05-03 MED ORDER — POTASSIUM CHLORIDE CRYS ER 20 MEQ PO TBCR
20.0000 meq | EXTENDED_RELEASE_TABLET | Freq: Once | ORAL | Status: AC
  Administered 2014-05-03: 20 meq via ORAL
  Filled 2014-05-03: qty 1

## 2014-05-03 NOTE — Progress Notes (Signed)
05/03/2014 2:23 PM Hemodialysis Outpatient Note;This patient has been accepted at the [facility] and the center can begin treatment on Saturday December 26th at 11:00 AM. Thank you. Gordy Savers

## 2014-05-03 NOTE — Progress Notes (Signed)
I have seen and examined this patient and agree with the plan of care . Miguel Compton has myeloma and  Probably myeloma related renal disease and has been determined to be End Stage renal Diease   [name] W 05/03/2014, 2:41 PM

## 2014-05-03 NOTE — Progress Notes (Signed)
PATIENT DETAILS Name: Miguel Compton Age: 80 y.o. Sex: male Date of Birth: 11-20-1921 Admit Date: 04/18/2014 Admitting Physician Costin Karlyne Greenspan, MD JZP:[id number], Jenny Reichmann, MD  Brief Narrative: From prior PN 80 year old highly functional male with history of COPD, hypertension, dyslipidemia and mild early dementia along with CVA in the past was admitted from PCP office for worsening renal failure. Patient was recently diagnosed with multiple myeloma. Initially thought to have renal failure from multiple myeloma, however preliminary pathology from kidney biopsy does not entirely support this. Also underwent bone marrow biopsy which conformed Myeloma. Continues to have significant renal dysfunction, being followed by both nephrology and oncology. Has been started on Decadron and Velcade by oncology. Nephrology following and patient has undergone hemodialysis on 04/22/14, current plans are to follow renal function with prn dialysis and close monitoring  Subjective: No new complaints  Assessment/Plan: Principal Problem: ARF: - Nephrology on board - Patient currently undergoing process to get plugged into a dialysis schedule as outpatient.  Active Problems: Leukocytosis: Likely due to steroid use and possible multiple myeloma. No fever,no signs of infection. Blood culture on 12/20 neg so far. Remain non toxic appearing, without any fever. Off of antibiotics  Hypokalemia: - Will replace orally and reassess   Multiple myeloma: - Hem/Onc consulted - Currently undergoing work up  History of CVA: - no new focal neurological deficits reported. Currently on aspirin.  Dyslipidemia:  - On statin, stable  Mild dementia: On Aricept. stable  Essential hypertension: BP controlled, continue Coreg, Amlodipine discontinued-as vol being removed with HD. Marland Kitchen   Constipation: continue  miralax,senokot.Prn Lactulose. Having almost daily BM's  Disposition: Remain inpatient  Antibiotics:  See  below  Anti-infectives    Start     Dose/Rate Route Frequency Ordered Stop   05/03/14 1600  acyclovir (ZOVIRAX) 200 MG capsule 400 mg     400 mg Oral Daily 05/02/14 1003     04/21/14 2200  acyclovir (ZOVIRAX) 200 MG capsule 400 mg  Status:  Discontinued     400 mg Oral 2 times daily 04/21/14 1658 05/02/14 1003   04/21/14 0000  acyclovir (ZOVIRAX) 400 MG tablet     400 mg Oral 2 times daily 04/21/14 1659     04/19/14 0930  ceFAZolin (ANCEF) IVPB 2 g/50 mL premix    Comments:  Hang ON CALL to xray 12/9   2 g100 mL/hr over 30 Minutes Intravenous On call 04/19/14 0917 04/19/14 1133      DVT Prophylaxis: Prophylactic  Heparin  Code Status: Full code   Family Communication Son in law at bedside on 12/17  Procedures:  None  CONSULTS:  nephrology and hematology/oncology   MEDICATIONS: Scheduled Meds: . sodium chloride   Intravenous Once  . acyclovir  400 mg Oral Daily  . aspirin  81 mg Oral Daily  . calcitRIOL  0.5 mcg Oral Q M,W,F-HD  . carvedilol  3.125 mg Oral BID WC  . darbepoetin (ARANESP) injection - DIALYSIS  150 mcg Intravenous Q Fri-HD  . [START ON 05/04/2014] dexamethasone  20 mg Oral Weekly  . feeding supplement (NEPRO CARB STEADY)  237 mL Oral Q1500  . fluticasone  1 puff Inhalation BID  . heparin subcutaneous  5,000 Units Subcutaneous 3 times per day  . lanthanum  1,000 mg Oral TID WC  . multivitamin  1 tablet Oral QHS  . pantoprazole  40 mg Oral Daily  . polyethylene glycol  17 g Oral BID  . pravastatin  80 mg Oral q1800  . senna  2 tablet Oral BID  . sodium chloride  3 mL Intravenous Q12H   Continuous Infusions:  PRN Meds:.albuterol, ALPRAZolam, bisacodyl, HYDROcodone-acetaminophen, lactulose, prochlorperazine    PHYSICAL EXAM: Vital signs in last 24 hours: Filed Vitals:   05/03/14 1015 05/03/14 1030 05/03/14 1042 05/03/14 1103  BP: 161/89 160/72 145/70 155/76  Pulse: 78 77 77 67  Temp: 97.2 F (36.2 C) 98 F (36.7 C) 98 F (36.7 C) 98.2 F  (36.8 C)  TempSrc: Oral Oral Oral Oral  Resp: _0 Height:      Weight:    84.5 kg (186 lb 4.6 oz)  SpO2:    95%    Weight change: 0.399 kg (14.1 oz) Filed Weights   05/01/14 2119 05/03/14 0654 05/03/14 1103  Weight: 85.1 kg (187 lb 9.8 oz) 85.5 kg (188 lb 7.9 oz) 84.5 kg (186 lb 4.6 oz)   Body mass index is 26.73 kg/(m^2).   Gen Exam: Awake and alert with clear speech.   Neck: Supple, No JVD.   Chest: B/L Clear.  No rales or rhonchi CVS: S1 S2 Regular, no murmurs.  Abdomen: soft, BS +, non tender, non distended.  Extremities: no edema, lower extremities warm to touch. Neurologic: No facial asymmetry Skin: No Rash.   Wounds: N/A.    Intake/Output from previous day:  Intake/Output Summary (Last 24 hours) at 05/03/14 1600 Last data filed at 05/03/14 1103  Gross per 24 hour  Intake     60 ml  Output   1501 ml  Net  -1441 ml     LAB RESULTS: CBC  Recent Labs Lab 04/28/14 0500 04/30/14 0448 05/01/14 0500 05/03/14 0500  WBC 24.7* 33.8* 24.9* 23.2*  HGB 9.0* 10.0* 8.9* 7.9*  HCT 27.4* 29.7* 26.8* 23.6*  PLT 282 216 186 147*  MCV 89.0 92.0 89.6 92.2  MCH 29.2 31.0 29.8 30.9  MCHC 32.8 33.7 33.2 33.5  RDW 13.1 13.4 13.2 13.5  LYMPHSABS  --  0.7 2.4 2.1  MONOABS  --  2.0* 1.6* 1.5*  EOSABS  --  0.0 0.1 0.2  BASOSABS  --  0.0 0.0 0.0    Chemistries   Recent Labs Lab 04/29/14 1640 04/30/14 0448 05/01/14 0500 05/02/14 0926 05/03/14 0500  NA 135* 135* 133* 134* 131*  K 5.0 5.5* 5.1 3.9 3.3*  CL 94* 94* 91* 97 93*  CO2 _1 GLUCOSE 163* 87 75 112* 90  BUN 86* 96* 108* 46* 60*  CREATININE 6.08* 6.64* 8.44* 6.10* 7.62*  CALCIUM 8.2* 7.9* 7.6* 7.3* 7.1*    CBG: No results for input(s): GLUCAP in the last 168 hours.  GFR Estimated Creatinine Clearance: 6.7 mL/min (by C-G formula based on Cr of 7.62).  Coagulation profile No results for input(s): INR, PROTIME in the last 168 hours.  Cardiac Enzymes No results for input(s): CKMB,  TROPONINI, MYOGLOBIN in the last 168 hours.  Invalid input(s): CK  Invalid input(s): POCBNP No results for input(s): DDIMER in the last 72 hours. No results for input(s): HGBA1C in the last 72 hours. No results for input(s): CHOL, HDL, LDLCALC, TRIG, CHOLHDL, LDLDIRECT in the last 72 hours. No results for input(s): TSH, T4TOTAL, T3FREE, THYROIDAB in the last 72 hours.  Invalid input(s): FREET3 No results for input(s): VITAMINB12, FOLATE, FERRITIN, TIBC, IRON, RETICCTPCT in the last 72 hours. No results for input(s): LIPASE, AMYLASE in the last 72 hours.  Urine Studies No results  for input(s): UHGB, CRYS in the last 72 hours.  Invalid input(s): UACOL, UAPR, USPG, UPH, UTP, UGL, UKET, UBIL, UNIT, UROB, ULEU, UEPI, UWBC, URBC, UBAC, CAST, UCOM, BILUA  MICROBIOLOGY: Recent Results (from the past 240 hour(s))  Culture, blood (routine x 2)     Status: None (Preliminary result)   Collection Time: 04/30/14  8:30 AM  Result Value Ref Range Status   Specimen Description RIGHT ANTECUBITAL  Final   Special Requests BOTTLES DRAWN AEROBIC AND ANAEROBIC 10CC  Final   Culture  Setup Time   Final    04/30/2014 21:04 Performed at [facility]    Culture   Final           BLOOD CULTURE RECEIVED NO GROWTH TO DATE CULTURE WILL BE HELD FOR 5 DAYS BEFORE ISSUING A FINAL NEGATIVE REPORT Performed at [facility]    Report Status PENDING  Incomplete  Culture, blood (routine x 2)     Status: None (Preliminary result)   Collection Time: 04/30/14  8:38 AM  Result Value Ref Range Status   Specimen Description RIGHT ANTECUBITAL  Final   Special Requests BOTTLES DRAWN AEROBIC AND ANAEROBIC 10CC  Final   Culture  Setup Time   Final    04/30/2014 21:04 Performed at [facility]    Culture   Final           BLOOD CULTURE RECEIVED NO GROWTH TO DATE CULTURE WILL BE HELD FOR 5 DAYS BEFORE ISSUING A FINAL NEGATIVE REPORT Performed at [facility]    Report Status PENDING   Incomplete    RADIOLOGY STUDIES/RESULTS: Dg Chest 2 View  04/18/2014   CLINICAL DATA:  ABNORMAL LAB, shortness of breath, COPD  EXAM: CHEST - 2 VIEW  COMPARISON:  08/25/2011  FINDINGS: Coarse bilateral interstitial opacities left greater than right, with relative sparing of the apices, increased since previous exam. Heart size is normal. No effusion. Visualized skeletal structures are unremarkable.  IMPRESSION: 1. Progressive coarse bilateral asymmetric interstitial opacities, probably largely chronic.   Electronically Signed   By: Arne Cleveland M.D.   On: 04/18/2014 15:36   Ir Fluoro Guide Cv Line Right  04/19/2014   CLINICAL DATA:  Acute renal insufficiency, needs access for hemodialysis  EXAM: TUNNELED HEMODIALYSIS CATHETER PLACEMENT WITH ULTRASOUND AND FLUOROSCOPIC GUIDANCE  TECHNIQUE: The procedure, risks, benefits, and alternatives were explained to the patient. Questions regarding the procedure were encouraged and answered. The patient understands and consents to the procedure. As antibiotic prophylaxis, cefazolin 2 g was ordered pre-procedure and administered intravenously within one hour of incision.Patency of the right IJ vein was confirmed with ultrasound with image documentation. An appropriate skin site was determined. Region was prepped using maximum barrier technique including cap and mask, sterile gown, sterile gloves, large sterile sheet, and Chlorhexidine as cutaneous antisepsis. The region was infiltrated locally with 1% lidocaine.  Intravenous Fentanyl and Versed were administered as conscious sedation during continuous cardiorespiratory monitoring by the radiology RN, with a total moderate sedation time of 25 minutes.  Under real-time ultrasound guidance, the right IJ vein was accessed with a 21 gauge micropuncture needle; the needle tip within the vein was confirmed with ultrasound image documentation. Needle exchanged over the 018 guidewire for transitional dilator, which allowed  advancement of a Benson wire into the IVC. Over this, an MPA catheter was advanced. A Hemosplit 19 hemodialysis catheter was tunneled from the right anterior chest wall approach to the right IJ dermatotomy site. The MPA catheter was  exchanged over an Amplatz wire for serial vascular dilators which allow placement of a peel-away sheath, through which the catheter was advanced under intermittent fluoroscopy, positioned with its tips in the proximal and midright atrium. Spot chest radiograph confirms good catheter position. No pneumothorax. Catheter was flushed and primed per protocol. Catheter secured externally with O Prolene sutures. The right IJ dermatotomy site was closed with Dermabond.  COMPLICATIONS: COMPLICATIONS None immediate  FLUOROSCOPY TIME:  48 seconds  COMPARISON:  None  IMPRESSION: 1. Technically successful placement of tunneled right IJ hemodialysis catheter with ultrasound and fluoroscopic guidance. Ready for routine use.  ACCESS: Remains approachable for percutaneous intervention as needed.   Electronically Signed   By: Arne Cleveland M.D.   On: 04/19/2014 12:10   Ir US Guide Vasc Access Right  04/19/2014   CLINICAL DATA:  Acute renal insufficiency, needs access for hemodialysis  EXAM: TUNNELED HEMODIALYSIS CATHETER PLACEMENT WITH ULTRASOUND AND FLUOROSCOPIC GUIDANCE  TECHNIQUE: The procedure, risks, benefits, and alternatives were explained to the patient. Questions regarding the procedure were encouraged and answered. The patient understands and consents to the procedure. As antibiotic prophylaxis, cefazolin 2 g was ordered pre-procedure and administered intravenously within one hour of incision.Patency of the right IJ vein was confirmed with ultrasound with image documentation. An appropriate skin site was determined. Region was prepped using maximum barrier technique including cap and mask, sterile gown, sterile gloves, large sterile sheet, and Chlorhexidine as cutaneous antisepsis. The  region was infiltrated locally with 1% lidocaine.  Intravenous Fentanyl and Versed were administered as conscious sedation during continuous cardiorespiratory monitoring by the radiology RN, with a total moderate sedation time of 25 minutes.  Under real-time ultrasound guidance, the right IJ vein was accessed with a 21 gauge micropuncture needle; the needle tip within the vein was confirmed with ultrasound image documentation. Needle exchanged over the 018 guidewire for transitional dilator, which allowed advancement of a Benson wire into the IVC. Over this, an MPA catheter was advanced. A Hemosplit 19 hemodialysis catheter was tunneled from the right anterior chest wall approach to the right IJ dermatotomy site. The MPA catheter was exchanged over an Amplatz wire for serial vascular dilators which allow placement of a peel-away sheath, through which the catheter was advanced under intermittent fluoroscopy, positioned with its tips in the proximal and midright atrium. Spot chest radiograph confirms good catheter position. No pneumothorax. Catheter was flushed and primed per protocol. Catheter secured externally with O Prolene sutures. The right IJ dermatotomy site was closed with Dermabond.  COMPLICATIONS: COMPLICATIONS None immediate  FLUOROSCOPY TIME:  48 seconds  COMPARISON:  None  IMPRESSION: 1. Technically successful placement of tunneled right IJ hemodialysis catheter with ultrasound and fluoroscopic guidance. Ready for routine use.  ACCESS: Remains approachable for percutaneous intervention as needed.   Electronically Signed   By: Arne Cleveland M.D.   On: 04/19/2014 12:10   Ir US Guide Bx Asp/drain  04/19/2014   CLINICAL DATA:  Acute renal insufficiency  COMPARISON:  CT 03/21/2008  TECHNIQUE: ULTRASOUND GUIDED RENAL CORE BIOPSY  Survey ultrasound was performed and an appropriate skin entry site was localized. Site was marked, prepped with Betadine, draped in usual sterile fashion, infiltrated locally  with 1% lidocaine. Intravenous Fentanyl and Versed were administered as conscious sedation during continuous cardiorespiratory monitoring by the radiology RN with a total moderate sedation time of 25 minutes.  Under real time ultrasound guidance, a 15 gauge trocar needle was advanced to the margin of the lower pole left kidney. The 16  gauge core needle was coaxially advanced for 3 passes. The core samples were submitted to pathology. The patient tolerated procedure well.  COMPLICATIONS: COMPLICATIONS none  IMPRESSION: 1. Technically successful ultrasound-guided core renal biopsy , lower pole left kidney.   Electronically Signed   By: Arne Cleveland M.D.   On: 04/19/2014 12:12   Ct Biopsy  04/21/2014   CLINICAL DATA:  Multiple myeloma  EXAM: CT GUIDED RIGHT ILIAC BONE MARROW ASPIRATION AND CORE BIOPSY  Date:  12/11/201512/03/2014 10:04 am  Radiologist:  M. Daryll Brod, MD  Guidance:  CT  FLUOROSCOPY TIME:  NONE.  MEDICATIONS AND MEDICAL HISTORY: 1 mg Versed, 50 mcg fentanyl  ANESTHESIA/SEDATION: 15 min  CONTRAST:  None.  COMPLICATIONS: None immediate  PROCEDURE: Informed consent was obtained from the patient following explanation of the procedure, risks, benefits and alternatives. The patient understands, agrees and consents for the procedure. All questions were addressed. A time out was performed.  The patient was positioned prone and noncontrast localization CT was performed of the pelvis to demonstrate the iliac marrow spaces.  Maximal barrier sterile technique utilized including caps, mask, sterile gowns, sterile gloves, large sterile drape, hand hygiene, and betadine prep.  Under sterile conditions and local anesthesia, an 11 gauge coaxial bone biopsy needle was advanced into the right iliac marrow space. Needle position was confirmed with CT imaging. Initially, bone marrow aspiration was performed. Next, the 11 gauge outer cannula was utilized to obtain a right iliac bone marrow core biopsy. Needle was  removed. Hemostasis was obtained with compression. The patient tolerated the procedure well. Samples were prepared with the cytotechnologist. No immediate complications.  IMPRESSION: CT guided right iliac bone marrow aspiration and core biopsy.   Electronically Signed   By: Daryll Brod M.D.   On: 04/21/2014 10:13   Dg Chest Port 1 View  04/30/2014   CLINICAL DATA:  Shortness of breath and leukocytosis  EXAM: PORTABLE CHEST - 1 VIEW  COMPARISON:  April 18, 2014  FINDINGS: There is underlying emphysematous change. There is no edema or consolidation. There is interstitial prominence in the mid and lower lung zones, probably representing underlying fibrotic type change, stable. There is no frank edema or consolidation. Heart is upper normal in size. Pulmonary vascularity is within normal limits. Aorta appears somewhat tortuous but stable. Central catheter tip is in the superior vena cava. No pneumothorax. No adenopathy. No bone lesions. There is degenerative change in the thoracic spine.  IMPRESSION: Underlying emphysema. Changes of interstitial fibrosis, stable. No frank edema or consolidation.   Electronically Signed   By: Lowella Grip M.D.   On: 04/30/2014 08:03   Dg Bone Survey Met  04/20/2014   CLINICAL DATA:  80 year old male with multiple myeloma.  EXAM: METASTATIC BONE SURVEY  COMPARISON:  Recent prior osseous survey 04/13/2014  FINDINGS: Chest: No discrete lytic lesion in the visualized bones. Right IJ approach tunneled hemodialysis/pheresis catheter. The catheter tips project over the mid SVC. Mild cardiomegaly. Moderately large hiatal hernia. Atherosclerotic calcification noted in the transverse aorta. Stable appearance of left lingular opacity, background bronchitic changes and interstitial prominence. No new acute cardiopulmonary finding.  Spine: Limited visualization of the cervical spine beyond the level of C6 secondary to overlap of the shoulder girdles. Multilevel cervical spondylosis.  Right worse than left facet arthropathy. No discrete lytic osseous lesion. Atherosclerotic calcification present in the region of the left carotid bifurcation. Multilevel degenerative spurring throughout the thoracic spine. No evidence of thoracic compression fracture. Degenerative spurring continues to the lumbar  spine. There is moderate facet arthropathy at L4-L5 and L5-S1. No discrete lytic lesion.  Skull: No discrete osseous lesion. Normal aeration of the paranasal sinuses.  Upper extremities: No discrete lytic osseous lesion. No acute fracture or malalignment. Nonunion of remote healed fracture involving the right ulnar styloid.  Pelvis: No discrete lytic osseous lesion. The bones are mildly osteopenic. Brachy therapy seeds project over the prostate gland. Mild bilateral hip joint osteoarthritis.  Lower extremities: No discrete lytic lesion. Mild scattered atherosclerotic vascular calcifications.  IMPRESSION: No discrete lytic lesion to suggest plasmacytoma or focal multiple myeloma.  Additional ancillary findings as above.   Electronically Signed   By: Jacqulynn Cadet M.D.   On: 04/20/2014 10:50    Velvet Bathe, MD  [facility] Pager:[phone]  If 7PM-7AM, please contact night-coverage [email or web address] Password [email or web address] 05/03/2014, 4:00 PM   LOS: 15 days

## 2014-05-03 NOTE — Progress Notes (Signed)
Physical Therapy Treatment Patient Details Name: Miguel Compton MRN: [id number] DOB: 1922/04/06 Today's Date: 05/03/2014    History of Present Illness Pt is a 80 y.o. male with Past medical history of COPD, dementia, depression, CVA, overflow incontinence. The patient is presenting with complaints of an abnormal lab. He mentions that since last 2 months he has been having generalized malaise, leg swelling, and poor appetite with weight loss as throughout the year he has been busy taking care of his wife who has been hospitalized multiple times. He has noticed he has some right-sided flank pain ongoing since last one week. He also mentions that he has occasional episodes of constipation followed by episodes of bowel movements which are consistent only mucousy output for last a year or so.    PT Comments    Patient is progressing well towards physical therapy goals, ambulating up to 115 feet at a min guard level with a rolling walker. States he feels much better today and is ready to go home as soon as possible. Tolerated therapeutic exercises well. Will continue to follow until d/c. Patient will continue to benefit from skilled physical therapy services to further improve independence with functional mobility.    Follow Up Recommendations  Home health PT;Supervision - Intermittent     Equipment Recommendations  None recommended by PT    Recommendations for Other Services       Precautions / Restrictions Precautions Precautions: Fall Restrictions Weight Bearing Restrictions: No    Mobility  Bed Mobility               General bed mobility comments: Pt sitting up in recliner chair upon PT arrival.   Transfers Overall transfer level: Needs assistance Equipment used: Rolling walker (2 wheeled) Transfers: Sit to/from Stand Sit to Stand: Min guard         General transfer comment: Close guard for safety. Required two attempts and VC for hand placement but able to complete  without physical assist from reclining chair.  Ambulation/Gait Ambulation/Gait assistance: Min guard Ambulation Distance (Feet): 115 Feet Assistive device: Rolling walker (2 wheeled) Gait Pattern/deviations: Step-through pattern;Decreased stride length;Trunk flexed Gait velocity: Decreased   General Gait Details: VC for upright posture and walker placement for proximity. Difficulty with sharp turns. No loss of balance noted during bout. Focused on increased stride length which he responded to well with intermittent cues.   Stairs            Wheelchair Mobility    Modified Rankin (Stroke Patients Only)       Balance                                    Cognition Arousal/Alertness: Awake/alert Behavior During Therapy: WFL for tasks assessed/performed Overall Cognitive Status: Within Functional Limits for tasks assessed                      Exercises General Exercises - Lower Extremity Ankle Circles/Pumps: AROM;Both;15 reps;Seated Long Arc Quad: Strengthening;Both;15 reps;Seated Hip Flexion/Marching: Strengthening;Both;15 reps;Seated    General Comments        Pertinent Vitals/Pain Pain Assessment: No/denies pain  SpO2 92% on room air. HR 80s    Home Living                      Prior Function            PT Goals (  current goals can now be found in the care plan section) Acute Rehab PT Goals PT Goal Formulation: With patient Time For Goal Achievement: 05/08/14 Potential to Achieve Goals: Good Progress towards PT goals: Progressing toward goals    Frequency  Min 3X/week    PT Plan Current plan remains appropriate    Co-evaluation             End of Session Equipment Utilized During Treatment: Gait belt Activity Tolerance: Patient limited by fatigue Patient left: in chair;with call bell/phone within reach;with chair alarm set     Time: 9470-9628 PT Time Calculation (min) (ACUTE ONLY): 12 min  Charges:  $Gait  Training: 8-22 mins                    G Codes:      Ellouise Newer 05/10/2014, 5:04 PM Camille Bal [place], [place]

## 2014-05-03 NOTE — Progress Notes (Signed)
Assessment/Plan: 1 ESRD suspected. 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 I suspect given his age and comorbidity that the chance for recovery of the renal failure will be small, therefore will begin CLIP process for OP HD, next HD Wed. I discussed with Dr. Malvin Johns at [facility]) and given his current debility we will delay AVF placement.  Will try to expedite discharge home. 2 Anemia s/p PRBCs during dialysis today 3 MMyeloma  Subjective: Interval History: Upset about his situation and need to stay in hospital  Objective: Vital signs in last 24 hours: Temp:  [97.2 F (36.2 C)-99.3 F (37.4 C)] 98.2 F (36.8 C) (12/23 1103) Pulse Rate:  [65-78] 67 (12/23 1103) Resp:  [14-24] 21 (12/23 1103) BP: (105-161)/(38-89) 155/76 mmHg (12/23 1103) SpO2:  [90 %-95 %] 95 % (12/23 1103) Weight:  [84.5 kg (186 lb 4.6 oz)-85.5 kg (188 lb 7.9 oz)] 84.5 kg (186 lb 4.6 oz) (12/23 1103) Weight change: 0.399 kg (14.1 oz)  Intake/Output from previous day: 12/22 0701 - 12/23 0700 In: 420 [P.O.:420] Out: 2 [Urine:1; Stool:1] Intake/Output this shift: Total I/O In: 0  Out: 1500 [Other:1500]  General appearance: alert, cooperative and anxious Extremities: edema 1+  Lab Results:  Recent Labs  05/01/14 0500 05/03/14 0500  WBC 24.9* 23.2*  HGB 8.9* 7.9*  HCT 26.8* 23.6*  PLT 186 147*   BMET:  Recent Labs  05/02/14 0926 05/03/14 0500  NA 134* 131*  K 3.9 3.3*  CL 97 93*  CO2 24 25  GLUCOSE 112* 90  BUN 46* 60*  CREATININE 6.10* 7.62*  CALCIUM 7.3* 7.1*   No results for input(s): PTH in the last 72 hours. Iron Studies: No results for input(s): IRON, TIBC, TRANSFERRIN, FERRITIN in the last 72 hours. Studies/Results: No results found.  Scheduled: . sodium chloride   Intravenous Once  . acyclovir  400 mg Oral Daily  . aspirin  81 mg Oral Daily  . calcitRIOL  0.5 mcg Oral Q M,W,F-HD  . carvedilol  3.125 mg  Oral BID WC  . darbepoetin (ARANESP) injection - DIALYSIS  150 mcg Intravenous Q Fri-HD  . [START ON 05/04/2014] dexamethasone  20 mg Oral Weekly  . feeding supplement (NEPRO CARB STEADY)  237 mL Oral Q1500  . fluticasone  1 puff Inhalation BID  . heparin subcutaneous  5,000 Units Subcutaneous 3 times per day  . lanthanum  1,000 mg Oral TID WC  . multivitamin  1 tablet Oral QHS  . pantoprazole  40 mg Oral Daily  . polyethylene glycol  17 g Oral BID  . pravastatin  80 mg Oral q1800  . senna  2 tablet Oral BID  . sodium chloride  3 mL Intravenous Q12H     LOS: 15 days   Myrla Malanowski C 05/03/2014,12:28 PM

## 2014-05-03 NOTE — Progress Notes (Signed)
NUTRITION FOLLOW UP  INTERVENTION: Continue Nepro Shake po once daily, each supplement provides 425 kcal and 19 grams protein  Encourage adequate PO intake.   NUTRITION DIAGNOSIS: Increased nutrient needs related to acute renal failure as evidenced by estimated nutrition needs; ongoing  Goal: Pt to meet >/= 90% of their estimated nutrition needs; met  Monitor:  PO intake, weight trends, labs, I/O's  80 y.o. male  Admitting Dx: Renal insufficiency  ASSESSMENT: Pt with a past medical history significant for COPD, hypertension, hyperlipidemia, mild dementia, history of CVA, presents to the emergency room as he was sent by his doctor for abnormal blood work.   12/9: R IJ HD cathter placement with [place] and fluoroscopy and renal bx 12/11: RT ILIAC BM ASP AND CORE BX  Meal completion has been has been around 100%. Pt has been drinking his Nepro Shakes. Will continue with current orders. Pt encouraged to eat his food at meals and to drink his supplement.  Labs: Low sodium, potassium, chloride, and GFR. High BUN, creatinine, phosphorous (5.6).   Height: Ht Readings from Last 1 Encounters:  04/22/14 $RemoveB'5\' 10"'rwRNsmPS$  (1.778 m)    Weight: Wt Readings from Last 1 Encounters:  05/03/14 186 lb 4.6 oz (84.5 kg)  12/10  187 lbs  Body mass index is 26.73 kg/(m^2).   Re-Estimated Nutritional Needs: Kcal: 1850-2100 Protein: 90-110 grams Fluid: 1.2 L/day  Skin: non-pitting LE edema; incision lower back, and right pelvis  Diet Order: Diet renal W/1283mL fluid restriction    Intake/Output Summary (Last 24 hours) at 05/03/14 1554 Last data filed at 05/03/14 1103  Gross per 24 hour  Intake     60 ml  Output   1501 ml  Net  -1441 ml    Last BM: 12/22  Labs:   Recent Labs Lab 05/01/14 0500 05/02/14 0926 05/03/14 0500  NA 133* 134* 131*  K 5.1 3.9 3.3*  CL 91* 97 93*  CO2 $Re'23 24 25  'Pua$ BUN 108* 46* 60*  CREATININE 8.44* 6.10* 7.62*  CALCIUM 7.6* 7.3* 7.1*  PHOS 6.8* 5.2* 5.6*   GLUCOSE 75 112* 90    CBG (last 3)  No results for input(s): GLUCAP in the last 72 hours.  Scheduled Meds: . sodium chloride   Intravenous Once  . acyclovir  400 mg Oral Daily  . aspirin  81 mg Oral Daily  . calcitRIOL  0.5 mcg Oral Q M,W,F-HD  . carvedilol  3.125 mg Oral BID WC  . darbepoetin (ARANESP) injection - DIALYSIS  150 mcg Intravenous Q Fri-HD  . [START ON 05/04/2014] dexamethasone  20 mg Oral Weekly  . feeding supplement (NEPRO CARB STEADY)  237 mL Oral Q1500  . fluticasone  1 puff Inhalation BID  . heparin subcutaneous  5,000 Units Subcutaneous 3 times per day  . lanthanum  1,000 mg Oral TID WC  . multivitamin  1 tablet Oral QHS  . pantoprazole  40 mg Oral Daily  . polyethylene glycol  17 g Oral BID  . pravastatin  80 mg Oral q1800  . senna  2 tablet Oral BID  . sodium chloride  3 mL Intravenous Q12H    Continuous Infusions:   Past Medical History  Diagnosis Date  . High cholesterol   . Depression   . GERD (gastroesophageal reflux disease)   . Hypertension   . Arthritis     "slight; in my hands" (04/18/2014)  . Anxiety   . Renal insufficiency   . Peripheral neuropathy     "  fingers go to sleep"  . Dementia     "mild"  . Skin cancer of face     "I believe they burned them off"  . Prostate cancer     Past Surgical History  Procedure Laterality Date  . Tonsillectomy  1920's  . Cataract extraction, bilateral Bilateral 2000's  . Insertion prostate radiation seed  04/2001  . Cardiac catheterization  1985's    Kallie Locks, MS, RD, LDN Pager # [phone] After hours/ weekend pager # [phone]

## 2014-05-04 ENCOUNTER — Other Ambulatory Visit: Payer: Medicare Other

## 2014-05-04 ENCOUNTER — Ambulatory Visit: Payer: Medicare Other

## 2014-05-04 LAB — TYPE AND SCREEN
ABO/RH(D): A POS
ANTIBODY SCREEN: NEGATIVE
UNIT DIVISION: 0
Unit division: 0

## 2014-05-04 LAB — RENAL FUNCTION PANEL
ANION GAP: 9 (ref 5–15)
Albumin: 1.4 g/dL — ABNORMAL LOW (ref 3.5–5.2)
BUN: 28 mg/dL — AB (ref 6–23)
CO2: 27 mmol/L (ref 19–32)
Calcium: 7.5 mg/dL — ABNORMAL LOW (ref 8.4–10.5)
Chloride: 100 mEq/L (ref 96–112)
Creatinine, Ser: 5.12 mg/dL — ABNORMAL HIGH (ref 0.50–1.35)
GFR calc Af Amer: 10 mL/min — ABNORMAL LOW (ref >90)
GFR calc non Af Amer: 9 mL/min — ABNORMAL LOW (ref >90)
Glucose, Bld: 85 mg/dL (ref 70–99)
PHOSPHORUS: 4.2 mg/dL (ref 2.3–4.6)
POTASSIUM: 3.9 mmol/L (ref 3.5–5.1)
SODIUM: 136 mmol/L (ref 135–145)

## 2014-05-04 MED ORDER — BORTEZOMIB CHEMO SQ INJECTION 3.5 MG (2.5MG/ML)
1.6000 mg/m2 | Freq: Once | INTRAMUSCULAR | Status: AC
  Administered 2014-05-04: 3.25 mg via SUBCUTANEOUS
  Filled 2014-05-04: qty 1.3

## 2014-05-04 MED ORDER — CALCITRIOL 0.5 MCG PO CAPS: 0.5000 ug | ORAL_CAPSULE | ORAL | Status: AC

## 2014-05-04 MED ORDER — DEXAMETHASONE 4 MG PO TABS: 20.0000 mg | ORAL_TABLET | ORAL | Status: AC

## 2014-05-04 MED ORDER — RENA-VITE PO TABS: 1.0000 | ORAL_TABLET | Freq: Every day | ORAL | Status: AC

## 2014-05-04 MED ORDER — CARVEDILOL 3.125 MG PO TABS
3.1250 mg | ORAL_TABLET | Freq: Two times a day (BID) | ORAL | Status: AC
Start: 2014-05-04 — End: ?

## 2014-05-04 MED ORDER — LANTHANUM CARBONATE 1000 MG PO CHEW: 1000.0000 mg | CHEWABLE_TABLET | Freq: Three times a day (TID) | ORAL | Status: AC

## 2014-05-04 MED ORDER — ONDANSETRON HCL 8 MG PO TABS
8.0000 mg | ORAL_TABLET | Freq: Once | ORAL | Status: AC
  Administered 2014-05-04: 8 mg via ORAL
  Filled 2014-05-04: qty 1

## 2014-05-04 MED ORDER — NEPRO/CARBSTEADY PO LIQD: 237.0000 mL | Freq: Every day | ORAL | Status: AC

## 2014-05-04 NOTE — Care Management Note (Signed)
CARE MANAGEMENT NOTE 05/04/2014  Patient:  Miguel Compton, Miguel Compton   Account Number:  [id number]  Date Initiated:  04/20/2014  Documentation initiated by:  Deshon Hsiao  Subjective/Objective Assessment:   CM following for progression and d/c planning.     Action/Plan:   05/04/2014 Plan for d/c to home today, outpatient HD to begin Sat 05/06/14 in [place]. No HH needs, pt ambulatory.   Anticipated DC Date:  05/04/2014   Anticipated DC Plan:  HOME/SELF CARE         Choice offered to / List presented to:             Status of service:  Completed, signed off Medicare Important Message given?  YES (If response is "NO", the following Medicare IM given date fields will be blank) Date Medicare IM given:  05/04/2014 Medicare IM given by:  Nathifa Ritthaler Date Additional Medicare IM given:   Additional Medicare IM given by:    Discharge Disposition:  HOME/SELF CARE  Per UR Regulation:    If discussed at Long Length of Stay Meetings, dates discussed:    Comments:

## 2014-05-04 NOTE — Progress Notes (Signed)
  Assessment/Plan: 1 ESRD suspected. 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  For HD today if not refused 2 Anemia s/p PRBCs  3 MMyeloma Rx Chemo   Subjective: Interval History: He is wanting to go home  Objective: Vital signs in last 24 hours: Temp:  [98 F (36.7 C)-98.6 F (37 C)] 98.5 F (36.9 C) (12/24 1000) Pulse Rate:  [69-76] 69 (12/24 1000) Resp:  [18-20] 18 (12/24 1000) BP: (130-138)/(48-57) 138/57 mmHg (12/24 1000) SpO2:  [92 %-96 %] 95 % (12/24 1000) Weight:  [84.5 kg (186 lb 4.6 oz)] 84.5 kg (186 lb 4.6 oz) (12/23 2007) Weight change: -1 kg (-2 lb 3.3 oz)  Intake/Output from previous day: 12/23 0701 - 12/24 0700 In: 480 [P.O.:480] Out: 1500  Intake/Output this shift: Total I/O In: 240 [P.O.:240] Out: -   General appearance: alert and cooperative  Pale appearing, otherwise no change in exam  Lab Results:  Recent Labs  05/03/14 0500  WBC 23.2*  HGB 7.9*  HCT 23.6*  PLT 147*   BMET:  Recent Labs  05/03/14 0500 05/04/14 0500  NA 131* 136  K 3.3* 3.9  CL 93* 100  CO2 25 27  GLUCOSE 90 85  BUN 60* 28*  CREATININE 7.62* 5.12*  CALCIUM 7.1* 7.5*   No results for input(s): PTH in the last 72 hours. Iron Studies: No results for input(s): IRON, TIBC, TRANSFERRIN, FERRITIN in the last 72 hours. Studies/Results: No results found.  Scheduled: . sodium chloride   Intravenous Once  . acyclovir  400 mg Oral Daily  . aspirin  81 mg Oral Daily  . calcitRIOL  0.5 mcg Oral Q M,W,F-HD  . carvedilol  3.125 mg Oral BID WC  . darbepoetin (ARANESP) injection - DIALYSIS  150 mcg Intravenous Q Fri-HD  . dexamethasone  20 mg Oral Weekly  . feeding supplement (NEPRO CARB STEADY)  237 mL Oral Q1500  . fluticasone  1 puff Inhalation BID  . heparin subcutaneous  5,000 Units Subcutaneous 3 times per day  . lanthanum  1,000 mg Oral TID WC  .  multivitamin  1 tablet Oral QHS  . pantoprazole  40 mg Oral Daily  . polyethylene glycol  17 g Oral BID  . pravastatin  80 mg Oral q1800  . senna  2 tablet Oral BID  . sodium chloride  3 mL Intravenous Q12H     LOS: 16 days   Mikaella Escalona C 05/04/2014,11:13 AM

## 2014-05-04 NOTE — Discharge Summary (Signed)
Physician Discharge Summary  Miguel Compton KGY:[id number] DOB: Dec 29, 1921 DOA: 04/18/2014  PCP: [name], MD  Admit date: 04/18/2014 Discharge date: 05/04/2014  Time spent: > 35 minutes  Recommendations for Outpatient Follow-up:  1. Pt started on dialysis recently. He will need to follow up with his Nephrologist and next dialysis session is Saturday 05/06/14. He was offered another dialysis session prior to discharge of which he refused.  Discharge Diagnoses:  Principal Problem:   Renal insufficiency Active Problems:   Essential hypertension   Cognitive changes   Acute renal failure   Multiple myeloma   Discharge Condition: stable  Diet recommendation: Renal diet  Filed Weights   05/03/14 0654 05/03/14 1103 05/03/14 2007  Weight: 85.5 kg (188 lb 7.9 oz) 84.5 kg (186 lb 4.6 oz) 84.5 kg (186 lb 4.6 oz)    History of present illness:  According to HPI: 80 y.o. male has a past medical history significant for COPD, hypertension, hyperlipidemia, mild dementia, history of CVA, presents to the emergency room as he was sent by his doctor for abnormal blood work. He was recently hospitalized here and discharge about 2 weeks ago with fatigue and found to be in acute renal failure. Nephrology was consulted at that time, and is believed to have ATN of unclear etiology. Patient's renal function stabilized and he was discharged home. SPEP obtained during this hospitalization was however concerning for multiple myeloma. He was seen by Dr. Posey Pronto yesterday, and was started on Decadron last evening. His blood work yesterday in the clinic showed a renal failure and a potassium of 6.5, and patient was called in Kayexalate. Repeat blood work today shows worsening creatinine, potassium was 5.5, and he was directed to come to the emergency room for further treatment. Patient has no complaints, denies any chest pain or breathing difficulties, denies any fluid buildup, lower extremity swelling or weight gain.  Denies any fever or chills. He denies any nausea. He endorses decreased urination over the past 3 weeks. In the emergency room, patient is stable vital signs, has mild hyponatremia with a sodium of 129, potassium 5.5, BUN 75, creatinine 9.24. He has mild leukocytosis of 14.7 and mild anemia at 10.7.   Hospital Course:  ARF: - Nephrology Will manage moving forward. Patient set up for outpatient dialysis sessions Out pt dialysis at [facility] to begin - Ready for discharge and next dialysis session Saturday as listed above  Active Problems: Leukocytosis: Likely due to steroid use and possible multiple myeloma. No fever,no signs of infection. Blood culture on 12/20 neg so far. Remain non toxic appearing, without any fever. Off of antibiotics  Hypokalemia: - Resolved after replacement  Multiple myeloma: - Oncologist managed while patient in house. Patient to f/u with oncologist after discharge. - Lambda light chain disease  History of CVA: - no new focal neurological deficits reported. Currently on aspirin.  Dyslipidemia:  - On statin, stable  Mild dementia: On Aricept. stable  Essential hypertension:  - Well controlled currently  Constipation:  - Resolved  Procedures:  Please refer to chart  Consultations:  Nephrologist: Dr. Florene Glen  Oncologist: Dr. Lindi Adie  Discharge Exam: Filed Vitals:   05/04/14 1000  BP: 138/57  Pulse: 69  Temp: 98.5 F (36.9 C)  Resp: 18    General: Pt in nad, alert and awake Cardiovascular: rrr, no mrg Respiratory: cta bl, no wheezes  Discharge Instructions   Discharge Instructions    Call MD for:  extreme fatigue    Complete by:  As directed      Call MD for:  redness, tenderness, or signs of infection (pain, swelling, redness, odor or green/yellow discharge around incision site)    Complete by:  As directed      Call MD for:  temperature >100.4    Complete by:  As directed      Diet - low sodium heart healthy     Complete by:  As directed      Discharge instructions    Complete by:  As directed   Please continue your routine dialysis sessions and visits with your Nephrologist.     Increase activity slowly    Complete by:  As directed      [place]    Complete by:  As directed   Chemotherapy Appointment  - 1 hour           Current Discharge Medication List    START taking these medications   Details  acyclovir (ZOVIRAX) 400 MG tablet Take 1 tablet (400 mg total) by mouth 2 (two) times daily. Qty: 60 tablet, Refills: 3   Associated Diagnoses: Multiple myeloma    calcitRIOL (ROCALTROL) 0.5 MCG capsule Take 1 capsule (0.5 mcg total) by mouth every Monday, Wednesday, and Friday with hemodialysis. Qty: 30 capsule, Refills: 0    carvedilol (COREG) 3.125 MG tablet Take 1 tablet (3.125 mg total) by mouth 2 (two) times daily with a meal. Qty: 60 tablet, Refills: 0    lanthanum (FOSRENOL) 1000 MG chewable tablet Chew 1 tablet (1,000 mg total) by mouth 3 (three) times daily with meals. Qty: 90 tablet, Refills: 0    multivitamin (RENA-VIT) TABS tablet Take 1 tablet by mouth at bedtime. Qty: 30 each, Refills: 0    Nutritional Supplements (FEEDING SUPPLEMENT, NEPRO CARB STEADY,) LIQD Take 237 mLs by mouth daily at 3 pm. Refills: 0    prochlorperazine (COMPAZINE) 10 MG tablet Take 1 tablet (10 mg total) by mouth every 6 (six) hours as needed (Nausea or vomiting). Qty: 30 tablet, Refills: 1   Associated Diagnoses: Multiple myeloma      CONTINUE these medications which have CHANGED   Details  dexamethasone (DECADRON) 4 MG tablet Take 5 tablets (20 mg total) by mouth once a week. Qty: 10 tablet, Refills: 0      CONTINUE these medications which have NOT CHANGED   Details  albuterol (PROVENTIL HFA;VENTOLIN HFA) 108 (90 BASE) MCG/ACT inhaler Inhale 1-2 puffs into the lungs every 4 (four) hours as needed for wheezing or shortness of breath.    ALPRAZolam (XANAX) 0.25 MG tablet  Take 0.25 mg by mouth daily as needed for anxiety.    aspirin EC 81 MG tablet Take 81 mg by mouth daily.    donepezil (ARICEPT) 5 MG tablet Take 5 mg by mouth daily. Refills: 11    fluticasone (FLOVENT DISKUS) 50 MCG/BLIST diskus inhaler Inhale 1 puff into the lungs 2 (two) times daily.    HYDROcodone-acetaminophen (NORCO) 7.5-325 MG per tablet Take 1 tablet by mouth every 6 (six) hours as needed for moderate pain.    Omega-3 Fatty Acids (FISH OIL) 1000 MG CAPS Take by mouth.    omeprazole (PRILOSEC) 20 MG capsule Take 20 mg by mouth daily. Refills: 2    pravastatin (PRAVACHOL) 80 MG tablet Take 80 mg by mouth daily. Refills: 1      STOP taking these medications     cholecalciferol (VITAMIN D) 1000 UNITS tablet      Cyanocobalamin (B-12 PO)  mirtazapine (REMERON) 15 MG tablet      VESICARE 5 MG tablet        No Known Allergies    The results of significant diagnostics from this hospitalization (including imaging, microbiology, ancillary and laboratory) are listed below for reference.    Significant Diagnostic Studies: Dg Chest 2 View  04/18/2014   CLINICAL DATA:  ABNORMAL LAB, shortness of breath, COPD  EXAM: CHEST - 2 VIEW  COMPARISON:  08/25/2011  FINDINGS: Coarse bilateral interstitial opacities left greater than right, with relative sparing of the apices, increased since previous exam. Heart size is normal. No effusion. Visualized skeletal structures are unremarkable.  IMPRESSION: 1. Progressive coarse bilateral asymmetric interstitial opacities, probably largely chronic.   Electronically Signed   By: Arne Cleveland M.D.   On: 04/18/2014 15:36   Ir Fluoro Guide Cv Line Right  04/19/2014   CLINICAL DATA:  Acute renal insufficiency, needs access for hemodialysis  EXAM: TUNNELED HEMODIALYSIS CATHETER PLACEMENT WITH ULTRASOUND AND FLUOROSCOPIC GUIDANCE  TECHNIQUE: The procedure, risks, benefits, and alternatives were explained to the patient. Questions regarding the  procedure were encouraged and answered. The patient understands and consents to the procedure. As antibiotic prophylaxis, cefazolin 2 g was ordered pre-procedure and administered intravenously within one hour of incision.Patency of the right IJ vein was confirmed with ultrasound with image documentation. An appropriate skin site was determined. Region was prepped using maximum barrier technique including cap and mask, sterile gown, sterile gloves, large sterile sheet, and Chlorhexidine as cutaneous antisepsis. The region was infiltrated locally with 1% lidocaine.  Intravenous Fentanyl and Versed were administered as conscious sedation during continuous cardiorespiratory monitoring by the radiology RN, with a total moderate sedation time of 25 minutes.  Under real-time ultrasound guidance, the right IJ vein was accessed with a 21 gauge micropuncture needle; the needle tip within the vein was confirmed with ultrasound image documentation. Needle exchanged over the 018 guidewire for transitional dilator, which allowed advancement of a Benson wire into the IVC. Over this, an MPA catheter was advanced. A Hemosplit 19 hemodialysis catheter was tunneled from the right anterior chest wall approach to the right IJ dermatotomy site. The MPA catheter was exchanged over an Amplatz wire for serial vascular dilators which allow placement of a peel-away sheath, through which the catheter was advanced under intermittent fluoroscopy, positioned with its tips in the proximal and midright atrium. Spot chest radiograph confirms good catheter position. No pneumothorax. Catheter was flushed and primed per protocol. Catheter secured externally with O Prolene sutures. The right IJ dermatotomy site was closed with Dermabond.  COMPLICATIONS: COMPLICATIONS None immediate  FLUOROSCOPY TIME:  48 seconds  COMPARISON:  None  IMPRESSION: 1. Technically successful placement of tunneled right IJ hemodialysis catheter with ultrasound and fluoroscopic  guidance. Ready for routine use.  ACCESS: Remains approachable for percutaneous intervention as needed.   Electronically Signed   By: Arne Cleveland M.D.   On: 04/19/2014 12:10   Ir US Guide Vasc Access Right  04/19/2014   CLINICAL DATA:  Acute renal insufficiency, needs access for hemodialysis  EXAM: TUNNELED HEMODIALYSIS CATHETER PLACEMENT WITH ULTRASOUND AND FLUOROSCOPIC GUIDANCE  TECHNIQUE: The procedure, risks, benefits, and alternatives were explained to the patient. Questions regarding the procedure were encouraged and answered. The patient understands and consents to the procedure. As antibiotic prophylaxis, cefazolin 2 g was ordered pre-procedure and administered intravenously within one hour of incision.Patency of the right IJ vein was confirmed with ultrasound with image documentation. An appropriate skin site was determined. Region  was prepped using maximum barrier technique including cap and mask, sterile gown, sterile gloves, large sterile sheet, and Chlorhexidine as cutaneous antisepsis. The region was infiltrated locally with 1% lidocaine.  Intravenous Fentanyl and Versed were administered as conscious sedation during continuous cardiorespiratory monitoring by the radiology RN, with a total moderate sedation time of 25 minutes.  Under real-time ultrasound guidance, the right IJ vein was accessed with a 21 gauge micropuncture needle; the needle tip within the vein was confirmed with ultrasound image documentation. Needle exchanged over the 018 guidewire for transitional dilator, which allowed advancement of a Benson wire into the IVC. Over this, an MPA catheter was advanced. A Hemosplit 19 hemodialysis catheter was tunneled from the right anterior chest wall approach to the right IJ dermatotomy site. The MPA catheter was exchanged over an Amplatz wire for serial vascular dilators which allow placement of a peel-away sheath, through which the catheter was advanced under intermittent fluoroscopy,  positioned with its tips in the proximal and midright atrium. Spot chest radiograph confirms good catheter position. No pneumothorax. Catheter was flushed and primed per protocol. Catheter secured externally with O Prolene sutures. The right IJ dermatotomy site was closed with Dermabond.  COMPLICATIONS: COMPLICATIONS None immediate  FLUOROSCOPY TIME:  48 seconds  COMPARISON:  None  IMPRESSION: 1. Technically successful placement of tunneled right IJ hemodialysis catheter with ultrasound and fluoroscopic guidance. Ready for routine use.  ACCESS: Remains approachable for percutaneous intervention as needed.   Electronically Signed   By: Arne Cleveland M.D.   On: 04/19/2014 12:10   Ir US Guide Bx Asp/drain  04/19/2014   CLINICAL DATA:  Acute renal insufficiency  COMPARISON:  CT 03/21/2008  TECHNIQUE: ULTRASOUND GUIDED RENAL CORE BIOPSY  Survey ultrasound was performed and an appropriate skin entry site was localized. Site was marked, prepped with Betadine, draped in usual sterile fashion, infiltrated locally with 1% lidocaine. Intravenous Fentanyl and Versed were administered as conscious sedation during continuous cardiorespiratory monitoring by the radiology RN with a total moderate sedation time of 25 minutes.  Under real time ultrasound guidance, a 15 gauge trocar needle was advanced to the margin of the lower pole left kidney. The 16 gauge core needle was coaxially advanced for 3 passes. The core samples were submitted to pathology. The patient tolerated procedure well.  COMPLICATIONS: COMPLICATIONS none  IMPRESSION: 1. Technically successful ultrasound-guided core renal biopsy , lower pole left kidney.   Electronically Signed   By: Arne Cleveland M.D.   On: 04/19/2014 12:12   Ct Biopsy  04/21/2014   CLINICAL DATA:  Multiple myeloma  EXAM: CT GUIDED RIGHT ILIAC BONE MARROW ASPIRATION AND CORE BIOPSY  Date:  12/11/201512/03/2014 10:04 am  Radiologist:  M. Daryll Brod, MD  Guidance:  CT  FLUOROSCOPY TIME:   NONE.  MEDICATIONS AND MEDICAL HISTORY: 1 mg Versed, 50 mcg fentanyl  ANESTHESIA/SEDATION: 15 min  CONTRAST:  None.  COMPLICATIONS: None immediate  PROCEDURE: Informed consent was obtained from the patient following explanation of the procedure, risks, benefits and alternatives. The patient understands, agrees and consents for the procedure. All questions were addressed. A time out was performed.  The patient was positioned prone and noncontrast localization CT was performed of the pelvis to demonstrate the iliac marrow spaces.  Maximal barrier sterile technique utilized including caps, mask, sterile gowns, sterile gloves, large sterile drape, hand hygiene, and betadine prep.  Under sterile conditions and local anesthesia, an 11 gauge coaxial bone biopsy needle was advanced into the right iliac marrow space. Needle  position was confirmed with CT imaging. Initially, bone marrow aspiration was performed. Next, the 11 gauge outer cannula was utilized to obtain a right iliac bone marrow core biopsy. Needle was removed. Hemostasis was obtained with compression. The patient tolerated the procedure well. Samples were prepared with the cytotechnologist. No immediate complications.  IMPRESSION: CT guided right iliac bone marrow aspiration and core biopsy.   Electronically Signed   By: Daryll Brod M.D.   On: 04/21/2014 10:13   Dg Chest Port 1 View  04/30/2014   CLINICAL DATA:  Shortness of breath and leukocytosis  EXAM: PORTABLE CHEST - 1 VIEW  COMPARISON:  April 18, 2014  FINDINGS: There is underlying emphysematous change. There is no edema or consolidation. There is interstitial prominence in the mid and lower lung zones, probably representing underlying fibrotic type change, stable. There is no frank edema or consolidation. Heart is upper normal in size. Pulmonary vascularity is within normal limits. Aorta appears somewhat tortuous but stable. Central catheter tip is in the superior vena cava. No pneumothorax. No  adenopathy. No bone lesions. There is degenerative change in the thoracic spine.  IMPRESSION: Underlying emphysema. Changes of interstitial fibrosis, stable. No frank edema or consolidation.   Electronically Signed   By: Lowella Grip M.D.   On: 04/30/2014 08:03   Dg Bone Survey Met  04/20/2014   CLINICAL DATA:  80 year old male with multiple myeloma.  EXAM: METASTATIC BONE SURVEY  COMPARISON:  Recent prior osseous survey 04/13/2014  FINDINGS: Chest: No discrete lytic lesion in the visualized bones. Right IJ approach tunneled hemodialysis/pheresis catheter. The catheter tips project over the mid SVC. Mild cardiomegaly. Moderately large hiatal hernia. Atherosclerotic calcification noted in the transverse aorta. Stable appearance of left lingular opacity, background bronchitic changes and interstitial prominence. No new acute cardiopulmonary finding.  Spine: Limited visualization of the cervical spine beyond the level of C6 secondary to overlap of the shoulder girdles. Multilevel cervical spondylosis. Right worse than left facet arthropathy. No discrete lytic osseous lesion. Atherosclerotic calcification present in the region of the left carotid bifurcation. Multilevel degenerative spurring throughout the thoracic spine. No evidence of thoracic compression fracture. Degenerative spurring continues to the lumbar spine. There is moderate facet arthropathy at L4-L5 and L5-S1. No discrete lytic lesion.  Skull: No discrete osseous lesion. Normal aeration of the paranasal sinuses.  Upper extremities: No discrete lytic osseous lesion. No acute fracture or malalignment. Nonunion of remote healed fracture involving the right ulnar styloid.  Pelvis: No discrete lytic osseous lesion. The bones are mildly osteopenic. Brachy therapy seeds project over the prostate gland. Mild bilateral hip joint osteoarthritis.  Lower extremities: No discrete lytic lesion. Mild scattered atherosclerotic vascular calcifications.   IMPRESSION: No discrete lytic lesion to suggest plasmacytoma or focal multiple myeloma.  Additional ancillary findings as above.   Electronically Signed   By: Jacqulynn Cadet M.D.   On: 04/20/2014 10:50    Microbiology: Recent Results (from the past 240 hour(s))  Culture, blood (routine x 2)     Status: None (Preliminary result)   Collection Time: 04/30/14  8:30 AM  Result Value Ref Range Status   Specimen Description RIGHT ANTECUBITAL  Final   Special Requests BOTTLES DRAWN AEROBIC AND ANAEROBIC 10CC  Final   Culture  Setup Time   Final    04/30/2014 21:04 Performed at [facility]    Culture   Final           BLOOD CULTURE RECEIVED NO GROWTH TO DATE CULTURE WILL BE  HELD FOR 5 DAYS BEFORE ISSUING A FINAL NEGATIVE REPORT Performed at [facility]    Report Status PENDING  Incomplete  Culture, blood (routine x 2)     Status: None (Preliminary result)   Collection Time: 04/30/14  8:38 AM  Result Value Ref Range Status   Specimen Description RIGHT ANTECUBITAL  Final   Special Requests BOTTLES DRAWN AEROBIC AND ANAEROBIC 10CC  Final   Culture  Setup Time   Final    04/30/2014 21:04 Performed at [facility]    Culture   Final           BLOOD CULTURE RECEIVED NO GROWTH TO DATE CULTURE WILL BE HELD FOR 5 DAYS BEFORE ISSUING A FINAL NEGATIVE REPORT Performed at [facility]    Report Status PENDING  Incomplete     Labs: Basic Metabolic Panel:  Recent Labs Lab 04/30/14 0448 05/01/14 0500 05/02/14 0926 05/03/14 0500 05/04/14 0500  NA 135* 133* 134* 131* 136  K 5.5* 5.1 3.9 3.3* 3.9  CL 94* 91* 97 93* 100  CO2 $Re'20 23 24 25 27  'JsG$ GLUCOSE 87 75 112* 90 85  BUN 96* 108* 46* 60* 28*  CREATININE 6.64* 8.44* 6.10* 7.62* 5.12*  CALCIUM 7.9* 7.6* 7.3* 7.1* 7.5*  PHOS 7.3* 6.8* 5.2* 5.6* 4.2   Liver Function Tests:  Recent Labs Lab 04/28/14 0500  04/30/14 0448 05/01/14 0500 05/02/14 0926 05/03/14 0500 05/04/14 0500  AST 24  --   --   --    --   --   --   ALT 11  --   --   --   --   --   --   ALKPHOS 45  --   --   --   --   --   --   BILITOT 0.3  --   --   --   --   --   --   PROT 6.7  --   --   --   --   --   --   ALBUMIN 1.8*  1.7*  < > 1.9* 1.7* 1.6* 1.5* 1.4*  < > = values in this interval not displayed. No results for input(s): LIPASE, AMYLASE in the last 168 hours. No results for input(s): AMMONIA in the last 168 hours. CBC:  Recent Labs Lab 04/28/14 0500 04/30/14 0448 05/01/14 0500 05/03/14 0500  WBC 24.7* 33.8* 24.9* 23.2*  NEUTROABS  --  31.1* 20.8* 19.4*  HGB 9.0* 10.0* 8.9* 7.9*  HCT 27.4* 29.7* 26.8* 23.6*  MCV 89.0 92.0 89.6 92.2  PLT 282 216 186 147*   Cardiac Enzymes: No results for input(s): CKTOTAL, CKMB, CKMBINDEX, TROPONINI in the last 168 hours. BNP: BNP (last 3 results) No results for input(s): PROBNP in the last 8760 hours. CBG: No results for input(s): GLUCAP in the last 168 hours.     Signed:  Velvet Bathe  [facility] 05/04/2014, 2:25 PM

## 2014-05-04 NOTE — Clinical Social Work Note (Signed)
CSW advised by nurse that family requesting information on transportation as patient is new to dialysis. CSW visited room and spoke with patient, wife Raynell and son-in-law Reubin Milan and provided them with information on [employer] transportation in [place]. Mr. Edwinna Areola reported that he is aware of this resource and will make contact with them. Family currently provides patient with transportation to his medical and oncology appointments in [place]. CSW signing off as requested information provided. Patient and family appreciative of CSW's assistance.  Laquon Emel Givens, MSW, LCSW Licensed Clinical Social Worker [place] [phone]

## 2014-05-04 NOTE — Progress Notes (Signed)
Patient Discharge:  Disposition: Pt discharged home with family  Education: Pt, sn and wife educated on medications, follow up appointments, diet, and all discharge instructions. Pt and family verbalized understanding.   IV: Removed  Telemetry: Remioved. CCMD notified  Follow-up appointments: Reviewed with pt  Prescriptions: Scripts given to pt.   Transportation: Pt transported home by son  Belongings:All belongings taken with pt

## 2014-05-06 LAB — CULTURE, BLOOD (ROUTINE X 2)
CULTURE: NO GROWTH
Culture: NO GROWTH

## 2014-05-08 ENCOUNTER — Encounter (HOSPITAL_COMMUNITY): Payer: Self-pay

## 2014-05-08 ENCOUNTER — Telehealth: Payer: Self-pay | Admitting: *Deleted

## 2014-05-08 ENCOUNTER — Telehealth: Payer: Self-pay

## 2014-05-08 ENCOUNTER — Other Ambulatory Visit: Payer: Self-pay

## 2014-05-08 NOTE — Telephone Encounter (Signed)
Lab results received from [place] 04/28/14.  Copy to Dr Lindi Adie, Original to scan.

## 2014-05-08 NOTE — Telephone Encounter (Signed)
Family member called. Moved lab to closer to the appt on 12/31

## 2014-05-10 ENCOUNTER — Encounter (HOSPITAL_COMMUNITY): Payer: Self-pay

## 2014-05-11 ENCOUNTER — Other Ambulatory Visit: Payer: Self-pay | Admitting: Oncology

## 2014-05-11 ENCOUNTER — Encounter (HOSPITAL_COMMUNITY): Payer: Self-pay

## 2014-05-11 ENCOUNTER — Other Ambulatory Visit (HOSPITAL_BASED_OUTPATIENT_CLINIC_OR_DEPARTMENT_OTHER): Payer: Medicare Other

## 2014-05-11 ENCOUNTER — Ambulatory Visit: Payer: Medicare Other

## 2014-05-11 ENCOUNTER — Other Ambulatory Visit: Payer: Self-pay

## 2014-05-11 DIAGNOSIS — C9000 Multiple myeloma not having achieved remission: Secondary | ICD-10-CM

## 2014-05-11 LAB — CBC WITH DIFFERENTIAL/PLATELET
BASO%: 0.1 % (ref 0.0–2.0)
Basophils Absolute: 0 10*3/uL (ref 0.0–0.1)
EOS ABS: 0 10*3/uL (ref 0.0–0.5)
EOS%: 0 % (ref 0.0–7.0)
HCT: 32.7 % — ABNORMAL LOW (ref 38.4–49.9)
HEMOGLOBIN: 11 g/dL — AB (ref 13.0–17.1)
LYMPH%: 3.6 % — AB (ref 14.0–49.0)
MCH: 30 pg (ref 27.2–33.4)
MCHC: 33.6 g/dL (ref 32.0–36.0)
MCV: 89.1 fL (ref 79.3–98.0)
MONO#: 1.3 10*3/uL — AB (ref 0.1–0.9)
MONO%: 10.7 % (ref 0.0–14.0)
NEUT%: 85.6 % — ABNORMAL HIGH (ref 39.0–75.0)
NEUTROS ABS: 10.2 10*3/uL — AB (ref 1.5–6.5)
NRBC: 1 % — AB (ref 0–0)
RBC: 3.67 10*6/uL — AB (ref 4.20–5.82)
RDW: 14.4 % (ref 11.0–14.6)
WBC: 11.9 10*3/uL — ABNORMAL HIGH (ref 4.0–10.3)
lymph#: 0.4 10*3/uL — ABNORMAL LOW (ref 0.9–3.3)

## 2014-05-11 LAB — COMPREHENSIVE METABOLIC PANEL (CC13)
ALBUMIN: 1.8 g/dL — AB (ref 3.5–5.0)
ALT: 15 U/L (ref 0–55)
ANION GAP: 11 meq/L (ref 3–11)
AST: 23 U/L (ref 5–34)
Alkaline Phosphatase: 61 U/L (ref 40–150)
BUN: 23.9 mg/dL (ref 7.0–26.0)
CHLORIDE: 98 meq/L (ref 98–109)
CO2: 25 mEq/L (ref 22–29)
Calcium: 7.4 mg/dL — ABNORMAL LOW (ref 8.4–10.4)
Creatinine: 3.1 mg/dL (ref 0.7–1.3)
EGFR: 17 mL/min/{1.73_m2} — ABNORMAL LOW (ref >90)
Glucose: 148 mg/dl — ABNORMAL HIGH (ref 70–140)
Potassium: 4.5 mEq/L (ref 3.5–5.1)
Sodium: 134 mEq/L — ABNORMAL LOW (ref 136–145)
Total Bilirubin: 0.62 mg/dL (ref 0.20–1.20)
Total Protein: 5.6 g/dL — ABNORMAL LOW (ref 6.4–8.3)

## 2014-05-11 NOTE — Progress Notes (Signed)
Patient here with son-in-law for velcade.  Pt. Arrived in wheelchair.  Platelets have fallen to 63K.  Patient denies bleeding or bruising.   Discussed with Dr. Jana Hakim and will hold treatment today.  Jonelle Sports RN aware that patient has no appts. In future to see Dr. Lindi Adie.  Will discuss with him on Monday and make patient appt.  Reviewed  thrombocytopenia and precautions with patient and son-in-law.  They know to expect a call for appt. With Dr. Lindi Adie for next week and they will call us if they do not hear from [place] by Tuesday.   Gave patient and son-in-law chemo alert card and explained use.    Patient and son-in-law appreciated information.

## 2014-05-16 ENCOUNTER — Other Ambulatory Visit: Payer: Self-pay | Admitting: *Deleted

## 2014-05-16 ENCOUNTER — Telehealth: Payer: Self-pay | Admitting: Hematology and Oncology

## 2014-05-16 DIAGNOSIS — C9000 Multiple myeloma not having achieved remission: Secondary | ICD-10-CM

## 2014-05-16 LAB — PROTEIN ELECTROPHORESIS, SERUM
ALBUMIN ELP: 41.6 % — AB (ref 55.8–66.1)
Alpha-1-Globulin: 7.5 % — ABNORMAL HIGH (ref 2.9–4.9)
Alpha-2-Globulin: 15.7 % — ABNORMAL HIGH (ref 7.1–11.8)
BETA GLOBULIN: 5.8 % (ref 4.7–7.2)
Beta 2: 27.6 % — ABNORMAL HIGH (ref 3.2–6.5)
Gamma Globulin: 1.8 % — ABNORMAL LOW (ref 11.1–18.8)
M-SPIKE, %: 1.27 g/dL
Total Protein, Serum Electrophoresis: 5.4 g/dL — ABNORMAL LOW (ref 6.0–8.3)

## 2014-05-16 LAB — KAPPA/LAMBDA LIGHT CHAINS
Kappa free light chain: 2.22 mg/dL — ABNORMAL HIGH (ref 0.33–1.94)
Kappa:Lambda Ratio: 0.17 — ABNORMAL LOW (ref 0.26–1.65)
LAMBDA FREE LGHT CHN: 13.2 mg/dL — AB (ref 0.57–2.63)

## 2014-05-16 NOTE — Telephone Encounter (Signed)
, °

## 2014-05-17 ENCOUNTER — Telehealth: Payer: Self-pay | Admitting: *Deleted

## 2014-05-17 NOTE — Telephone Encounter (Signed)
Patient's son-in-law Reubin Milan called with concerns that patient mental status had declined yesterday and was questioning whether treatment was appropriate. Stated that he was told by dialysis nurse yesterday to contact patient's oncologist. Spoke with Dr. Lindi Adie, returned call to [facility] and he stated that patient's mental state had improved significantly from yesterday. Patient has appt with his PCP Dr. Micheal Likens today and Richardson Landry will meet with Dr. Lindi Adie this afternoon to discuss his concerns.

## 2014-05-18 ENCOUNTER — Telehealth: Payer: Self-pay | Admitting: Hematology and Oncology

## 2014-05-18 NOTE — Telephone Encounter (Signed)
, °

## 2014-05-19 ENCOUNTER — Ambulatory Visit: Payer: Medicare Other

## 2014-05-19 ENCOUNTER — Other Ambulatory Visit: Payer: Medicare Other

## 2014-05-26 ENCOUNTER — Other Ambulatory Visit: Payer: Self-pay

## 2014-05-26 ENCOUNTER — Ambulatory Visit: Payer: Self-pay | Admitting: Hematology and Oncology

## 2014-05-26 ENCOUNTER — Ambulatory Visit: Payer: Medicare Other

## 2014-05-26 ENCOUNTER — Other Ambulatory Visit: Payer: Medicare Other

## 2014-06-02 ENCOUNTER — Other Ambulatory Visit: Payer: Medicare Other

## 2014-06-02 ENCOUNTER — Ambulatory Visit: Payer: Medicare Other

## 2014-06-09 ENCOUNTER — Ambulatory Visit: Payer: Medicare Other

## 2014-06-09 ENCOUNTER — Other Ambulatory Visit: Payer: Medicare Other

## 2014-07-11 DEATH — deceased

## 2014-11-06 ENCOUNTER — Other Ambulatory Visit: Payer: Self-pay

## 2015-04-17 IMAGING — DX DG CHEST 2V
2 series · 2 of 2 positions shown · non-contrast
Comparison: 08/25/2011

CLINICAL DATA: ABNORMAL LAB, shortness of breath, COPD

EXAM:
CHEST - 2 VIEW

[chest pa]
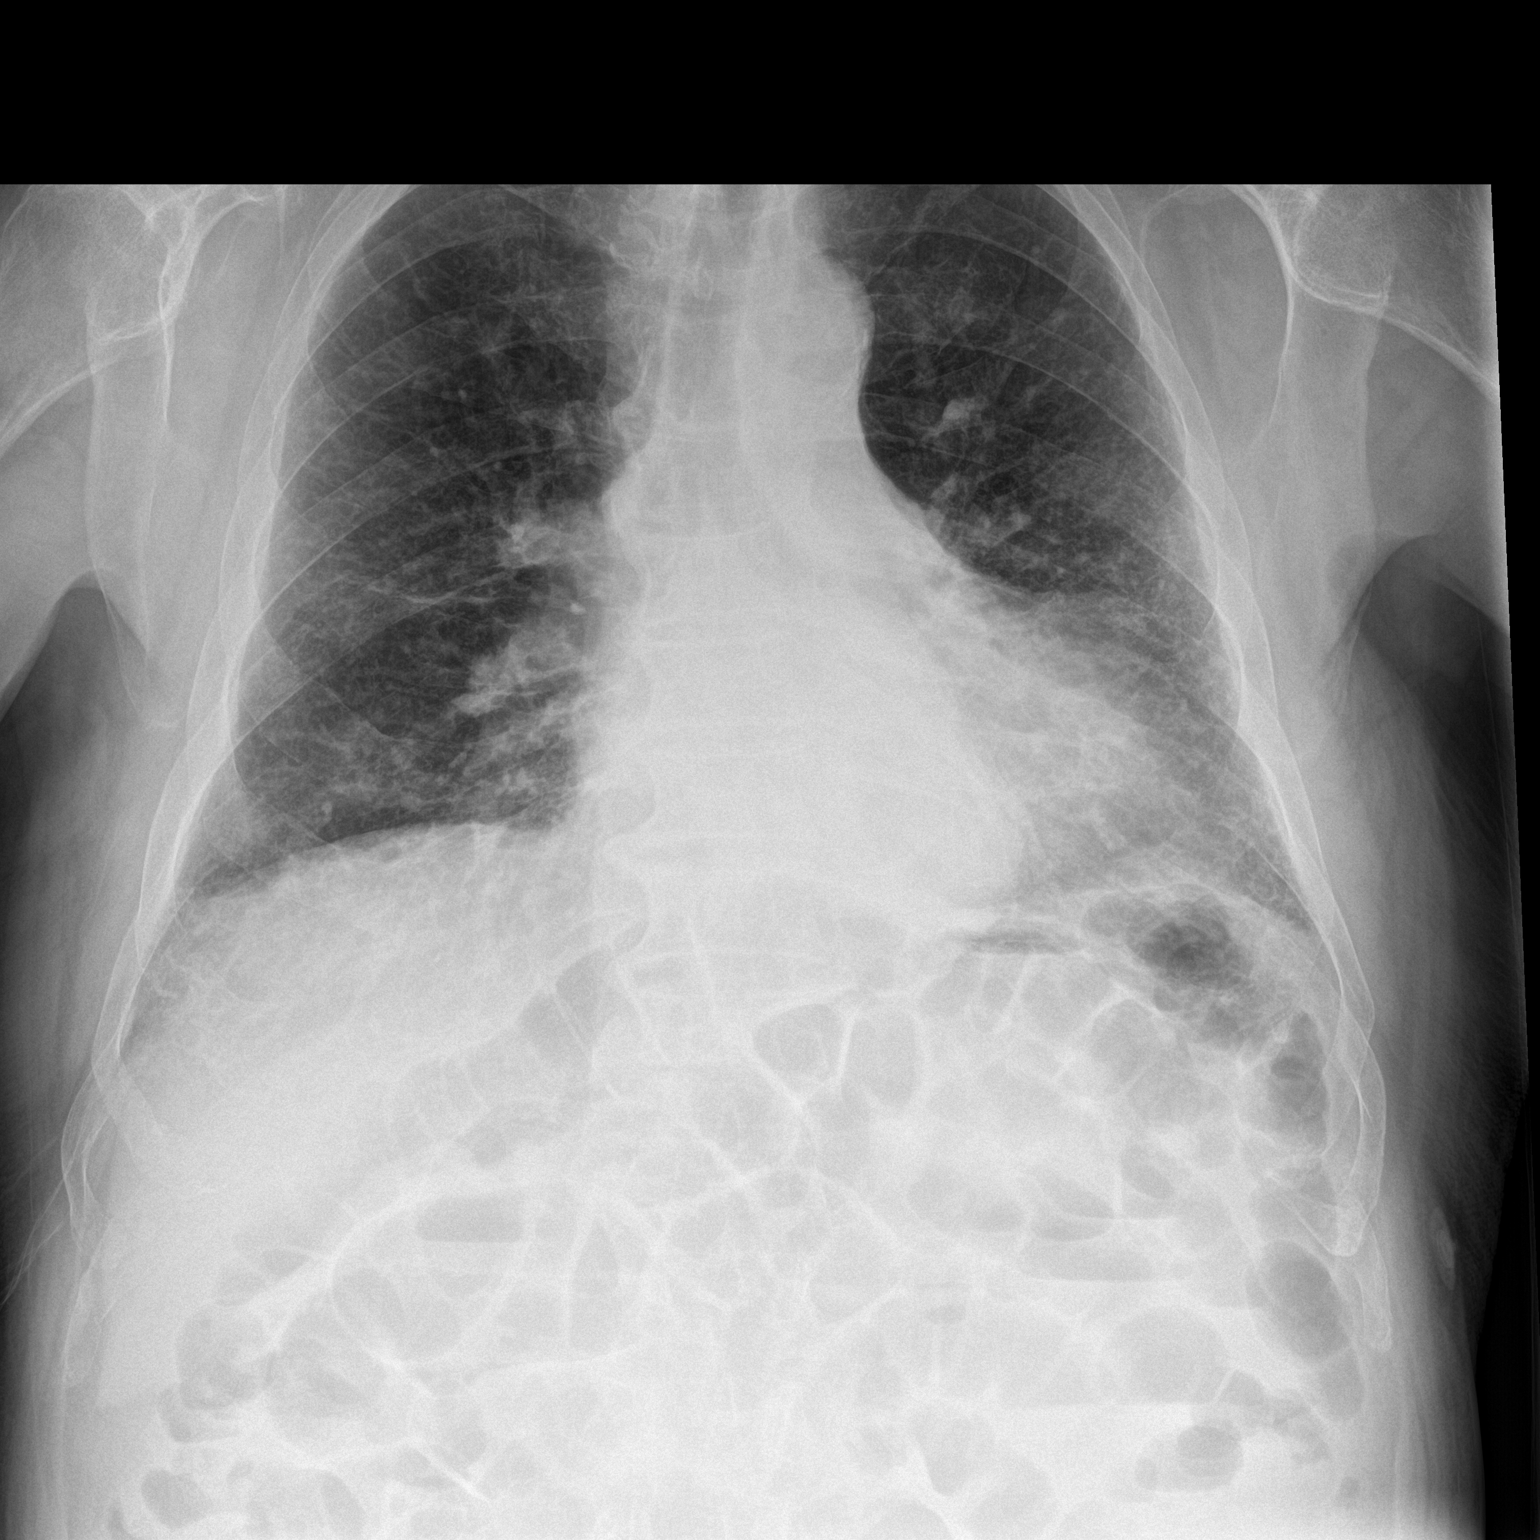

[chest lat]
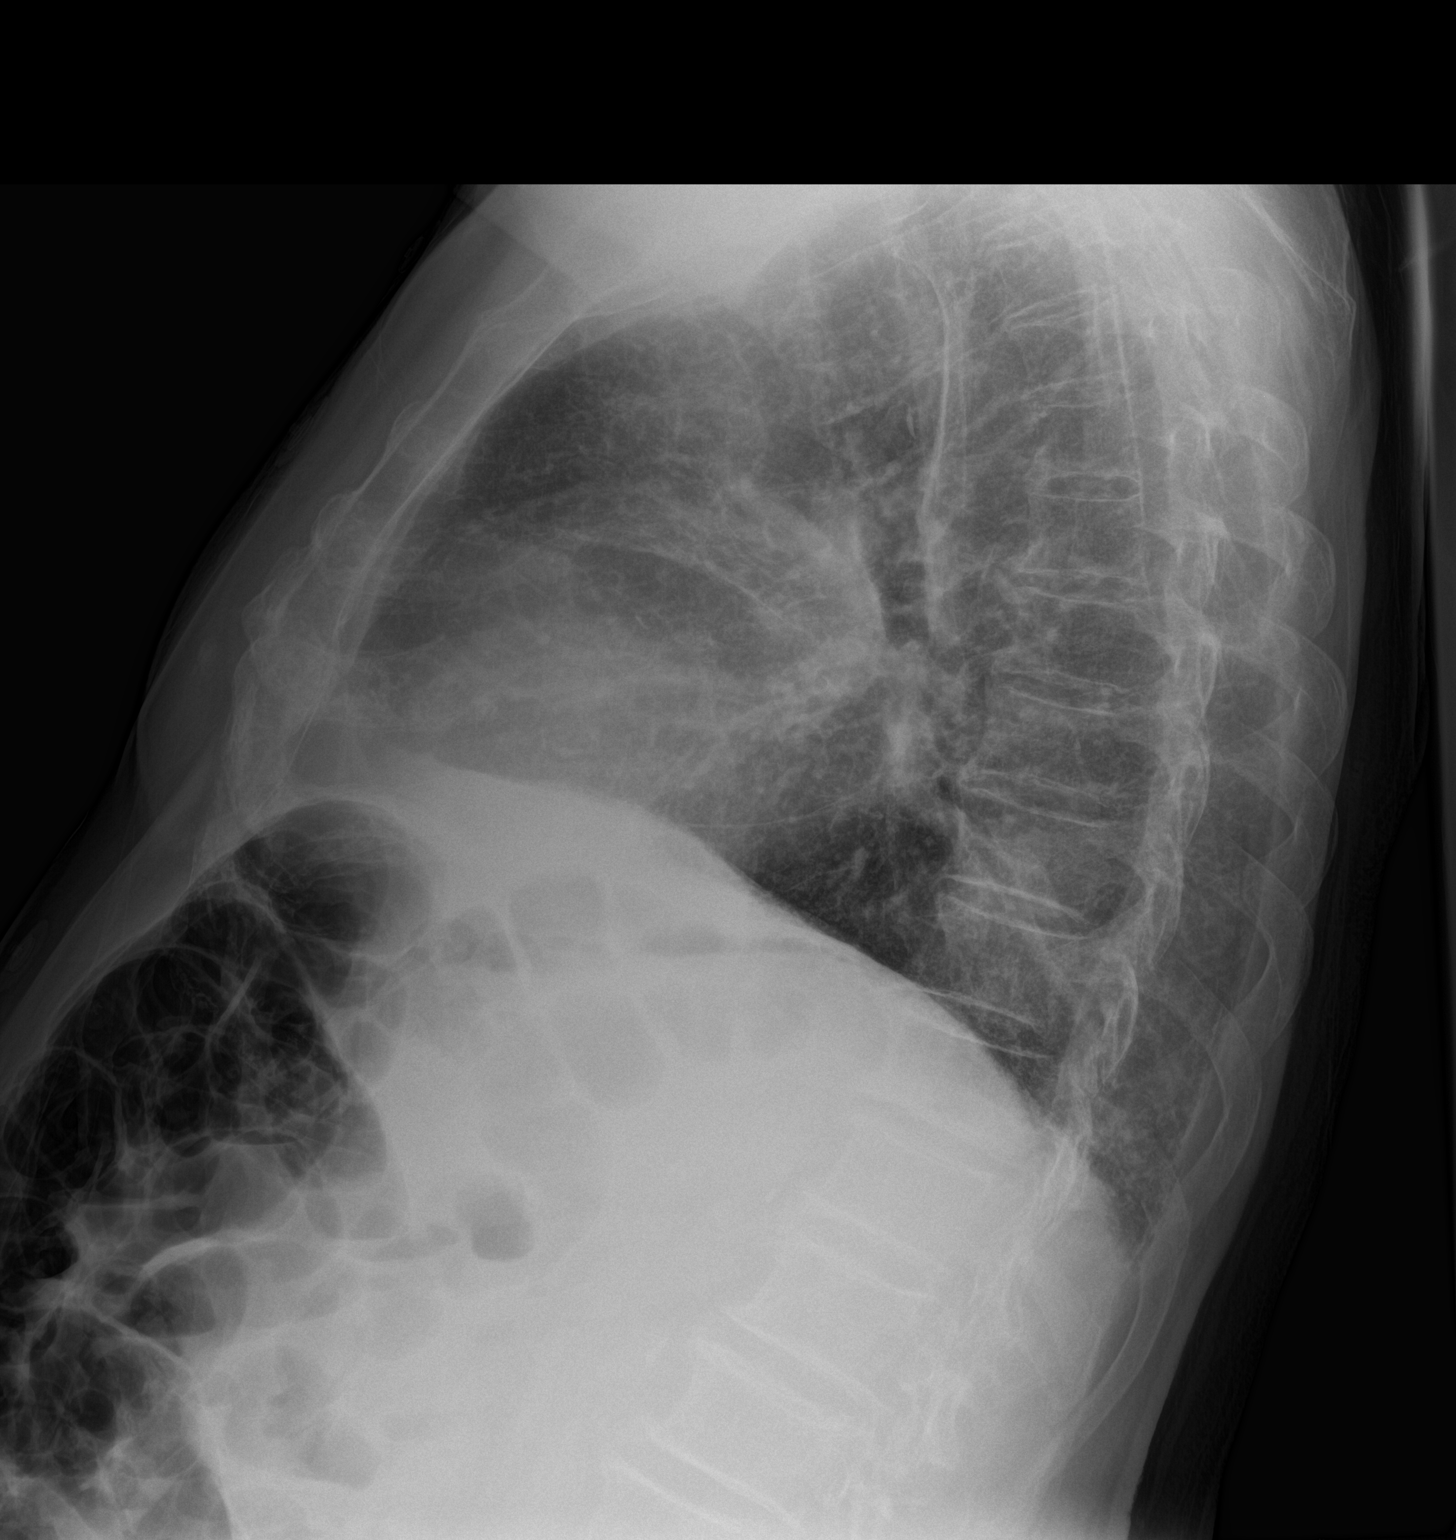

[2 of 2 positions shown; findings below may reference images not displayed]

FINDINGS: Coarse bilateral interstitial opacities left greater than right,
with relative sparing of the apices, increased since previous exam.
Heart size is normal.
No effusion.
Visualized skeletal structures are unremarkable.
IMPRESSION: 1. Progressive coarse bilateral asymmetric interstitial opacities,
probably largely chronic.

## 2015-04-18 IMAGING — US IR US GUIDE VASC ACCESS RIGHT
1 series · 3 of 3 positions shown · non-contrast
Comparison: None

CLINICAL DATA: Acute renal insufficiency, needs access for
hemodialysis

EXAM:
TUNNELED HEMODIALYSIS CATHETER PLACEMENT WITH ULTRASOUND AND
FLUOROSCOPIC GUIDANCE
TECHNIQUE: The procedure, risks, benefits, and alternatives were explained to
the patient. Questions regarding the procedure were encouraged and
answered. The patient understands and consents to the procedure. As
antibiotic prophylaxis, cefazolin 2 g was ordered pre-procedure and
administered intravenously within one hour of incision.Patency of
the right IJ vein was confirmed with ultrasound with image
documentation. An appropriate skin site was determined. Region was
prepped using maximum barrier technique including cap and mask,
sterile gown, sterile gloves, large sterile sheet, and Chlorhexidine
as cutaneous antisepsis. The region was infiltrated locally with 1%
lidocaine.

[Series 1: sp fluoro guide cv line*left* · 3 of 3 slices shown]
[im 1/3]
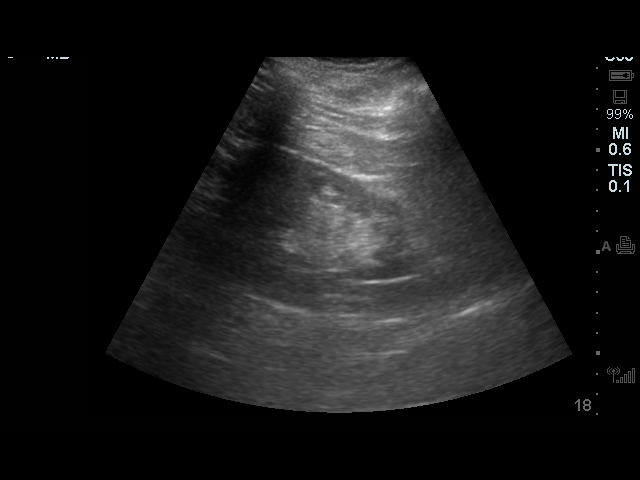
[im 2/3]
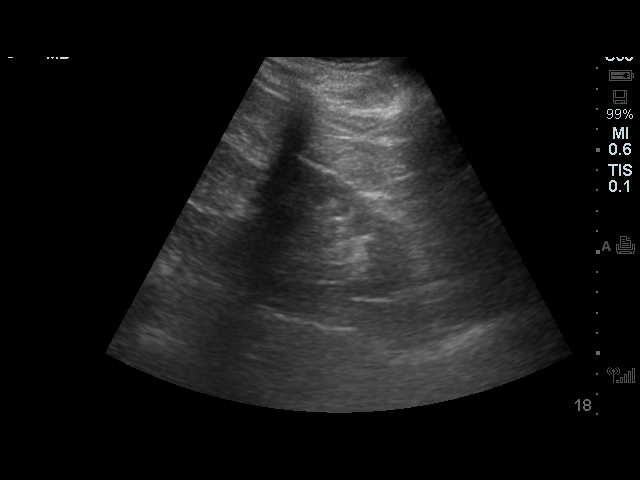
[im 3/3]
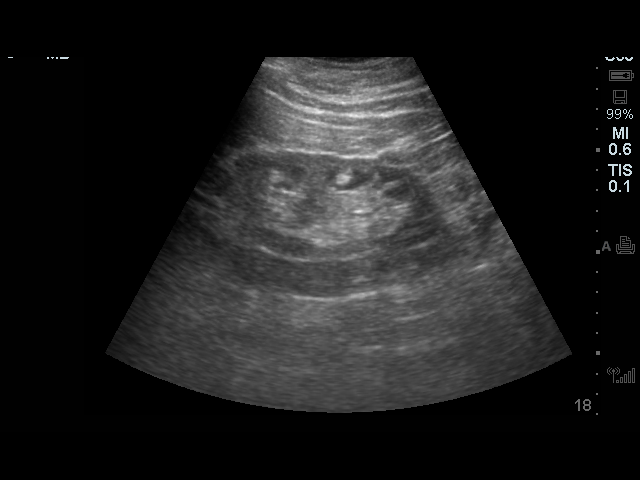

[3 of 3 positions shown; findings below may reference images not displayed]

Intravenous Fentanyl and Versed were administered as conscious
sedation during continuous cardiorespiratory monitoring by the
radiology RN, with a total moderate sedation time of 25 minutes.

Under real-time ultrasound guidance, the right IJ vein was accessed
with a 21 gauge micropuncture needle; the needle tip within the vein
was confirmed with ultrasound image documentation. Needle exchanged
over the 018 guidewire for transitional dilator, which allowed
advancement of a Benson wire into the IVC. Over this, an MPA
catheter was advanced. A Hemosplit 19 hemodialysis catheter was
tunneled from the right anterior chest wall approach to the right IJ
dermatotomy site. The MPA catheter was exchanged over an Amplatz
wire for serial vascular dilators which allow placement of a
peel-away sheath, through which the catheter was advanced under
intermittent fluoroscopy, positioned with its tips in the proximal
and midright atrium. Spot chest radiograph confirms good catheter
position. No pneumothorax. Catheter was flushed and primed per
protocol. Catheter secured externally with O Prolene sutures. The
right IJ dermatotomy site was closed with Dermabond.

COMPLICATIONS:
COMPLICATIONS
None immediate

FLUOROSCOPY TIME:  48 seconds
IMPRESSION: 1. Technically successful placement of tunneled right IJ
hemodialysis catheter with ultrasound and fluoroscopic guidance.
Ready for routine use.

ACCESS:
Remains approachable for percutaneous intervention as needed.

## 2016-05-06 ENCOUNTER — Other Ambulatory Visit: Payer: Self-pay | Admitting: Nurse Practitioner
